# Patient Record
Sex: Male | Born: 1975
Health system: Southern US, Community
[De-identification: ages and names within clinical notes are randomized; demographics above are authoritative.]

## PROBLEM LIST (undated history)

## (undated) DIAGNOSIS — S86011A Strain of right Achilles tendon, initial encounter: Secondary | ICD-10-CM

## (undated) DIAGNOSIS — E669 Obesity, unspecified: Secondary | ICD-10-CM

## (undated) DIAGNOSIS — Z8719 Personal history of other diseases of the digestive system: Secondary | ICD-10-CM

## (undated) DIAGNOSIS — F1010 Alcohol abuse, uncomplicated: Secondary | ICD-10-CM

## (undated) DIAGNOSIS — Z8614 Personal history of Methicillin resistant Staphylococcus aureus infection: Secondary | ICD-10-CM

## (undated) DIAGNOSIS — K219 Gastro-esophageal reflux disease without esophagitis: Secondary | ICD-10-CM

## (undated) DIAGNOSIS — M25461 Effusion, right knee: Secondary | ICD-10-CM

## (undated) DIAGNOSIS — R40243 Glasgow coma scale score 3-8, unspecified time: Secondary | ICD-10-CM

## (undated) DIAGNOSIS — S82832A Other fracture of upper and lower end of left fibula, initial encounter for closed fracture: Secondary | ICD-10-CM

## (undated) DIAGNOSIS — D509 Iron deficiency anemia, unspecified: Secondary | ICD-10-CM

## (undated) DIAGNOSIS — D649 Anemia, unspecified: Secondary | ICD-10-CM

## (undated) DIAGNOSIS — K648 Other hemorrhoids: Secondary | ICD-10-CM

## (undated) DIAGNOSIS — L739 Follicular disorder, unspecified: Secondary | ICD-10-CM

## (undated) DIAGNOSIS — K649 Unspecified hemorrhoids: Secondary | ICD-10-CM

## (undated) DIAGNOSIS — R200 Anesthesia of skin: Secondary | ICD-10-CM

## (undated) DIAGNOSIS — K922 Gastrointestinal hemorrhage, unspecified: Secondary | ICD-10-CM

## (undated) DIAGNOSIS — I4891 Unspecified atrial fibrillation: Secondary | ICD-10-CM

## (undated) DIAGNOSIS — M1990 Unspecified osteoarthritis, unspecified site: Secondary | ICD-10-CM

## (undated) DIAGNOSIS — J9601 Acute respiratory failure with hypoxia: Secondary | ICD-10-CM

## (undated) DIAGNOSIS — E538 Deficiency of other specified B group vitamins: Secondary | ICD-10-CM

## (undated) DIAGNOSIS — Z8739 Personal history of other diseases of the musculoskeletal system and connective tissue: Secondary | ICD-10-CM

## (undated) DIAGNOSIS — I10 Essential (primary) hypertension: Secondary | ICD-10-CM

## (undated) DIAGNOSIS — K644 Residual hemorrhoidal skin tags: Secondary | ICD-10-CM

## (undated) DIAGNOSIS — R739 Hyperglycemia, unspecified: Secondary | ICD-10-CM

## (undated) HISTORY — DX: Follicular disorder, unspecified: L73.9

## (undated) HISTORY — PX: OTHER SURGICAL HISTORY: SHX169

## (undated) HISTORY — DX: Gastrointestinal hemorrhage, unspecified: K92.2

## (undated) HISTORY — PX: COLONOSCOPY W/ BIOPSIES AND POLYPECTOMY: SHX1376

## (undated) HISTORY — DX: Acute respiratory failure with hypoxia: J96.01

## (undated) HISTORY — DX: Residual hemorrhoidal skin tags: K64.4

## (undated) HISTORY — DX: Essential (primary) hypertension: I10

## (undated) HISTORY — DX: Personal history of other diseases of the musculoskeletal system and connective tissue: Z87.39

## (undated) HISTORY — DX: Glasgow coma scale score 3-8, unspecified time: R40.2430

## (undated) HISTORY — DX: Unspecified atrial fibrillation: I48.91

## (undated) HISTORY — PX: LEG SURGERY: SHX1003

## (undated) HISTORY — DX: Unspecified hemorrhoids: K64.9

## (undated) HISTORY — DX: Hyperglycemia, unspecified: R73.9

## (undated) HISTORY — DX: Strain of right Achilles tendon, initial encounter: S86.011A

---

## 2006-03-20 ENCOUNTER — Observation Stay (HOSPITAL_COMMUNITY): Admission: EM | Admit: 2006-03-20 | Discharge: 2006-03-21 | Payer: Self-pay | Admitting: *Deleted

## 2006-03-29 ENCOUNTER — Emergency Department (HOSPITAL_COMMUNITY): Admission: EM | Admit: 2006-03-29 | Discharge: 2006-03-30 | Payer: Self-pay | Admitting: *Deleted

## 2006-04-01 ENCOUNTER — Emergency Department (HOSPITAL_COMMUNITY): Admission: EM | Admit: 2006-04-01 | Discharge: 2006-04-01 | Payer: Self-pay | Admitting: Emergency Medicine

## 2006-04-04 ENCOUNTER — Inpatient Hospital Stay (HOSPITAL_COMMUNITY): Admission: EM | Admit: 2006-04-04 | Discharge: 2006-04-08 | Payer: Self-pay | Admitting: Emergency Medicine

## 2006-04-10 ENCOUNTER — Emergency Department (HOSPITAL_COMMUNITY): Admission: EM | Admit: 2006-04-10 | Discharge: 2006-04-10 | Payer: Self-pay | Admitting: Emergency Medicine

## 2006-04-14 ENCOUNTER — Emergency Department (HOSPITAL_COMMUNITY): Admission: EM | Admit: 2006-04-14 | Discharge: 2006-04-14 | Payer: Self-pay | Admitting: Emergency Medicine

## 2006-04-17 ENCOUNTER — Emergency Department (HOSPITAL_COMMUNITY): Admission: EM | Admit: 2006-04-17 | Discharge: 2006-04-17 | Payer: Self-pay | Admitting: Emergency Medicine

## 2006-04-20 ENCOUNTER — Inpatient Hospital Stay (HOSPITAL_COMMUNITY): Admission: EM | Admit: 2006-04-20 | Discharge: 2006-04-21 | Payer: Self-pay | Admitting: Emergency Medicine

## 2006-05-04 ENCOUNTER — Emergency Department (HOSPITAL_COMMUNITY): Admission: EM | Admit: 2006-05-04 | Discharge: 2006-05-04 | Payer: Self-pay | Admitting: Emergency Medicine

## 2006-05-06 ENCOUNTER — Ambulatory Visit: Payer: Self-pay | Admitting: Infectious Diseases

## 2006-05-13 ENCOUNTER — Encounter (HOSPITAL_BASED_OUTPATIENT_CLINIC_OR_DEPARTMENT_OTHER): Admission: RE | Admit: 2006-05-13 | Discharge: 2006-06-04 | Payer: Self-pay | Admitting: Surgery

## 2006-05-14 ENCOUNTER — Ambulatory Visit (HOSPITAL_COMMUNITY): Admission: RE | Admit: 2006-05-14 | Discharge: 2006-05-14 | Payer: Self-pay | Admitting: Surgery

## 2006-05-21 ENCOUNTER — Ambulatory Visit (HOSPITAL_COMMUNITY): Admission: RE | Admit: 2006-05-21 | Discharge: 2006-05-21 | Payer: Self-pay | Admitting: Surgery

## 2006-05-21 ENCOUNTER — Encounter: Payer: Self-pay | Admitting: Vascular Surgery

## 2006-05-25 ENCOUNTER — Ambulatory Visit: Payer: Self-pay | Admitting: Infectious Diseases

## 2006-06-08 ENCOUNTER — Emergency Department (HOSPITAL_COMMUNITY): Admission: EM | Admit: 2006-06-08 | Discharge: 2006-06-08 | Payer: Self-pay | Admitting: Emergency Medicine

## 2006-06-09 DIAGNOSIS — Z8614 Personal history of Methicillin resistant Staphylococcus aureus infection: Secondary | ICD-10-CM | POA: Insufficient documentation

## 2006-06-09 HISTORY — DX: Personal history of Methicillin resistant Staphylococcus aureus infection: Z86.14

## 2006-06-12 ENCOUNTER — Encounter (HOSPITAL_BASED_OUTPATIENT_CLINIC_OR_DEPARTMENT_OTHER): Admission: RE | Admit: 2006-06-12 | Discharge: 2006-07-01 | Payer: Self-pay | Admitting: Surgery

## 2006-07-02 ENCOUNTER — Encounter: Payer: Self-pay | Admitting: Infectious Diseases

## 2006-07-13 ENCOUNTER — Ambulatory Visit: Payer: Self-pay | Admitting: Infectious Diseases

## 2006-07-13 DIAGNOSIS — L0291 Cutaneous abscess, unspecified: Secondary | ICD-10-CM | POA: Insufficient documentation

## 2006-07-13 DIAGNOSIS — L0390 Cellulitis, unspecified: Secondary | ICD-10-CM

## 2006-08-10 ENCOUNTER — Encounter: Payer: Self-pay | Admitting: Infectious Diseases

## 2006-08-12 ENCOUNTER — Ambulatory Visit: Payer: Self-pay | Admitting: Infectious Diseases

## 2006-08-13 ENCOUNTER — Telehealth (INDEPENDENT_AMBULATORY_CARE_PROVIDER_SITE_OTHER): Payer: Self-pay | Admitting: *Deleted

## 2006-08-13 ENCOUNTER — Encounter: Payer: Self-pay | Admitting: Infectious Diseases

## 2006-08-14 ENCOUNTER — Encounter (HOSPITAL_BASED_OUTPATIENT_CLINIC_OR_DEPARTMENT_OTHER): Admission: RE | Admit: 2006-08-14 | Discharge: 2006-09-04 | Payer: Self-pay | Admitting: Surgery

## 2006-08-15 ENCOUNTER — Encounter: Payer: Self-pay | Admitting: Infectious Diseases

## 2006-08-15 ENCOUNTER — Ambulatory Visit (HOSPITAL_COMMUNITY): Admission: RE | Admit: 2006-08-15 | Discharge: 2006-08-15 | Payer: Self-pay | Admitting: Infectious Diseases

## 2006-08-17 ENCOUNTER — Encounter: Payer: Self-pay | Admitting: Infectious Diseases

## 2006-08-17 ENCOUNTER — Telehealth: Payer: Self-pay | Admitting: Infectious Diseases

## 2006-08-18 ENCOUNTER — Encounter: Payer: Self-pay | Admitting: Infectious Diseases

## 2006-09-16 ENCOUNTER — Encounter: Payer: Self-pay | Admitting: Infectious Diseases

## 2006-09-23 ENCOUNTER — Ambulatory Visit: Payer: Self-pay | Admitting: Infectious Diseases

## 2006-10-16 ENCOUNTER — Encounter: Payer: Self-pay | Admitting: Infectious Diseases

## 2007-06-22 ENCOUNTER — Emergency Department (HOSPITAL_COMMUNITY): Admission: EM | Admit: 2007-06-22 | Discharge: 2007-06-23 | Payer: Self-pay | Admitting: Emergency Medicine

## 2007-12-01 ENCOUNTER — Emergency Department (HOSPITAL_COMMUNITY): Admission: EM | Admit: 2007-12-01 | Discharge: 2007-12-01 | Payer: Self-pay | Admitting: Emergency Medicine

## 2008-08-08 ENCOUNTER — Emergency Department (HOSPITAL_COMMUNITY): Admission: EM | Admit: 2008-08-08 | Discharge: 2008-08-09 | Payer: Self-pay | Admitting: Emergency Medicine

## 2008-08-18 ENCOUNTER — Encounter (HOSPITAL_BASED_OUTPATIENT_CLINIC_OR_DEPARTMENT_OTHER): Admission: RE | Admit: 2008-08-18 | Discharge: 2008-09-11 | Payer: Self-pay | Admitting: Internal Medicine

## 2008-09-08 ENCOUNTER — Emergency Department (HOSPITAL_COMMUNITY): Admission: EM | Admit: 2008-09-08 | Discharge: 2008-09-08 | Payer: Self-pay | Admitting: *Deleted

## 2008-09-25 ENCOUNTER — Emergency Department (HOSPITAL_COMMUNITY): Admission: EM | Admit: 2008-09-25 | Discharge: 2008-09-26 | Payer: Self-pay | Admitting: Emergency Medicine

## 2008-09-29 ENCOUNTER — Encounter: Payer: Self-pay | Admitting: Infectious Diseases

## 2008-09-29 ENCOUNTER — Ambulatory Visit: Payer: Self-pay | Admitting: Internal Medicine

## 2008-09-29 DIAGNOSIS — R21 Rash and other nonspecific skin eruption: Secondary | ICD-10-CM | POA: Insufficient documentation

## 2008-10-31 ENCOUNTER — Ambulatory Visit: Payer: Self-pay | Admitting: Internal Medicine

## 2009-04-14 ENCOUNTER — Emergency Department (HOSPITAL_COMMUNITY): Admission: EM | Admit: 2009-04-14 | Discharge: 2009-04-14 | Payer: Self-pay | Admitting: Emergency Medicine

## 2009-11-23 ENCOUNTER — Ambulatory Visit: Payer: Self-pay | Admitting: Diagnostic Radiology

## 2009-11-23 ENCOUNTER — Emergency Department (HOSPITAL_BASED_OUTPATIENT_CLINIC_OR_DEPARTMENT_OTHER): Admission: EM | Admit: 2009-11-23 | Discharge: 2009-11-23 | Payer: Self-pay | Admitting: Emergency Medicine

## 2009-12-25 ENCOUNTER — Emergency Department (HOSPITAL_COMMUNITY): Admission: EM | Admit: 2009-12-25 | Discharge: 2009-12-25 | Payer: Self-pay | Admitting: Emergency Medicine

## 2010-01-01 ENCOUNTER — Emergency Department (HOSPITAL_BASED_OUTPATIENT_CLINIC_OR_DEPARTMENT_OTHER): Admission: EM | Admit: 2010-01-01 | Discharge: 2010-01-01 | Payer: Self-pay | Admitting: Emergency Medicine

## 2010-01-10 ENCOUNTER — Encounter (HOSPITAL_BASED_OUTPATIENT_CLINIC_OR_DEPARTMENT_OTHER): Admission: RE | Admit: 2010-01-10 | Discharge: 2010-02-07 | Payer: Self-pay | Admitting: General Surgery

## 2010-03-23 ENCOUNTER — Emergency Department (HOSPITAL_BASED_OUTPATIENT_CLINIC_OR_DEPARTMENT_OTHER): Admission: EM | Admit: 2010-03-23 | Discharge: 2010-03-23 | Payer: Self-pay | Admitting: Emergency Medicine

## 2010-04-22 ENCOUNTER — Inpatient Hospital Stay (HOSPITAL_COMMUNITY): Admission: EM | Admit: 2010-04-22 | Discharge: 2010-04-26 | Payer: Self-pay | Admitting: Emergency Medicine

## 2010-04-22 ENCOUNTER — Emergency Department (HOSPITAL_COMMUNITY): Admission: EM | Admit: 2010-04-22 | Discharge: 2010-04-22 | Payer: Self-pay | Admitting: Emergency Medicine

## 2010-04-24 ENCOUNTER — Encounter: Payer: Self-pay | Admitting: Internal Medicine

## 2010-06-30 ENCOUNTER — Encounter: Payer: Self-pay | Admitting: Infectious Diseases
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
## 2010-07-09 NOTE — Letter (Signed)
Summary: [facility]   Imported By: Tomasita Morrow RN 08/19/2006 12:06:50  _____________________________________________________________________  External Attachment:    Type:   Image     Comment:   External Document
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
## 2010-07-09 NOTE — Progress Notes (Signed)
Summary: phne note re:MRI results  Phone Note Call from Patient Call back at Home Phone [phone]   Caller: Patient Call For: md Reason for Call: Lab or Test Results Summary of Call: Would like results of MRI.  Told patient that I would check on the results and get back with him after speaking withDr. Initial call taken by: Jennet Maduro RN,  August 17, 2006 10:49 AM

## 2010-07-09 NOTE — Procedures (Signed)
Summary: colonscopy /J. Mann   Colonoscopy  Procedure date:  04/24/2010  Findings:      Location:  [facility], [place].    OPERATIVE PROCEDURE REPORT  PATIENT: Wayne [name], Wayne [name] MR#: [id number] BIRTHDATE:  [birth year]  GENDER:  male ENDOSCOPIST:  Lorenza Burton, MD ASSISTANT:  Jimmey Ralph, RN, CGRN and Moishe Spice, technician.  PROCEDURE DATE:  04/24/2010 PRE-PROCEDURE PREPERATION:  The patient was prepped with 2 dulcolax tablets, one ten-ounce bottle of magnesium citrate, and a gallon of Golytley the night prior to the procedure.  The patient was fasted for 8 hours prior to the procedure. PRE-PROCEDURE PHYSICAL:  Patient has stable vital signs. Neck is supple. There is no JVD, thyromegaly or LAD. Chest clear to auscultation. S1 and S2 regular. Abdomen soft, non-distended, non-tender with NABS. PROCEDURE: Colonoscopy with cold biopsies x 4. ASA CLASS:  Class II INDICATIONS: 1) Rectal bleeding 2) Iron deficiency anemia 3) Constipation.  MEDICATIONS:  Fentanyl 25 mcg & Versed 2 mg IV.  DESCRIPTION OF PROCEDURE: After the risks, benefits, and alternatives of the procedure were thoroughly explained [including a 10% missed rate of cancer and polyps], informed consent was obtained.  The Pentax video colonoscope [id number] was introduced through the anus and advanced to the cecum, which was identified by both the appendix and ileocecal valve, limited by a redundant colon. The quality of the prep was adequate. Multiple washes were done. Small lesions could be missed. The instrument was then slowly withdrawn as the colon was fully examined. <<PROCEDUREIMAGES>>      <<OLD IMAGES>>  FINDINGS:  There were 2 small sessile distal left colon polyps that were removed by cold biopsies x 4. The rest of the colonic mucosa appeared healthy with a normal vascular pattern.  No masses, diverticula or AVM's were noted.  The appendiceal orifice and the ICV were identified. Retroflexed views revealed prominent  internal hemorrhoids. A thrombosed hemorrhoid was noted. The patient tolerated the procedure without immediate complications.  The scope was then withdrawn from the patient and the procedure terminated.  IMPRESSION: Large bleeding internal hemorrhoids and 2 sessile distal left colon polyps removed by cold biopsies; otherwise normal colonoscopy up to the cecum; there was a lot of residual stool in the colon [small lesions could be missed]. RECOMMENDATIONS: 1) Await pathology results.  2) Avoid all NSAIDS for the next 2 weeks.  3) High fiber diet with liberal fluid intake. 4) Colace 100 mg 2 PO QHS.  5) Surgical evaluation for a hemorrhoidectomy.  REPEAT EXAM:   At age 89; in case the patient has any abnormal GI symptoms in the interim, he/she should contact the office immediately for further recommendations.  DISCHARGE INSTRUCTIONS:  Standard discharge instructions given.  _______________________________ Lorenza Burton, MD  CPT CODES: 96295   DIAGNOSIS CODES:  211.3, 569.3, 280.9, 564.00

## 2010-07-09 NOTE — Progress Notes (Signed)
  Phone Note Outgoing Call   Call placed by: Jennet Maduro RN,  August 13, 2006 2:25 PM Call placed to: Specialist Action Taken: Information Sent Request: Send information Reason for Call: Get patient information Summary of Call: Faxed 08/12/06 [facility] to [facility] to allow pt. to make follow-up visit. Initial call taken by: Tomasita Morrow, RN

## 2010-08-20 LAB — DIFFERENTIAL
Basophils Absolute: 0 10*3/uL (ref 0.0–0.1)
Basophils Relative: 0 % (ref 0–1)
Eosinophils Absolute: 0.1 10*3/uL (ref 0.0–0.7)
Eosinophils Relative: 3 % (ref 0–5)
Lymphocytes Relative: 25 % (ref 12–46)
Lymphs Abs: 1.2 10*3/uL (ref 0.7–4.0)
Monocytes Absolute: 0.6 10*3/uL (ref 0.1–1.0)
Monocytes Relative: 12 % (ref 3–12)
Neutro Abs: 2.9 10*3/uL (ref 1.7–7.7)
Neutrophils Relative %: 60 % (ref 43–77)

## 2010-08-20 LAB — CBC
HCT: 25.1 % — ABNORMAL LOW (ref 39.0–52.0)
HCT: 25.9 % — ABNORMAL LOW (ref 39.0–52.0)
HCT: 28.3 % — ABNORMAL LOW (ref 39.0–52.0)
HCT: 28.7 % — ABNORMAL LOW (ref 39.0–52.0)
HCT: 28.8 % — ABNORMAL LOW (ref 39.0–52.0)
HCT: 29 % — ABNORMAL LOW (ref 39.0–52.0)
HCT: 29.5 % — ABNORMAL LOW (ref 39.0–52.0)
HCT: 30.4 % — ABNORMAL LOW (ref 39.0–52.0)
HCT: 32.8 % — ABNORMAL LOW (ref 39.0–52.0)
Hemoglobin: 7.3 g/dL — ABNORMAL LOW (ref 13.0–17.0)
Hemoglobin: 7.6 g/dL — ABNORMAL LOW (ref 13.0–17.0)
Hemoglobin: 8.5 g/dL — ABNORMAL LOW (ref 13.0–17.0)
Hemoglobin: 8.7 g/dL — ABNORMAL LOW (ref 13.0–17.0)
Hemoglobin: 8.7 g/dL — ABNORMAL LOW (ref 13.0–17.0)
Hemoglobin: 8.8 g/dL — ABNORMAL LOW (ref 13.0–17.0)
Hemoglobin: 8.9 g/dL — ABNORMAL LOW (ref 13.0–17.0)
Hemoglobin: 9.2 g/dL — ABNORMAL LOW (ref 13.0–17.0)
Hemoglobin: 9.7 g/dL — ABNORMAL LOW (ref 13.0–17.0)
MCH: 18.4 pg — ABNORMAL LOW (ref 26.0–34.0)
MCH: 18.6 pg — ABNORMAL LOW (ref 26.0–34.0)
MCH: 20 pg — ABNORMAL LOW (ref 26.0–34.0)
MCH: 20 pg — ABNORMAL LOW (ref 26.0–34.0)
MCH: 20.1 pg — ABNORMAL LOW (ref 26.0–34.0)
MCH: 20.2 pg — ABNORMAL LOW (ref 26.0–34.0)
MCH: 20.3 pg — ABNORMAL LOW (ref 26.0–34.0)
MCH: 20.4 pg — ABNORMAL LOW (ref 26.0–34.0)
MCH: 20.5 pg — ABNORMAL LOW (ref 26.0–34.0)
MCHC: 29.2 g/dL — ABNORMAL LOW (ref 30.0–36.0)
MCHC: 29.4 g/dL — ABNORMAL LOW (ref 30.0–36.0)
MCHC: 29.6 g/dL — ABNORMAL LOW (ref 30.0–36.0)
MCHC: 29.9 g/dL — ABNORMAL LOW (ref 30.0–36.0)
MCHC: 30 g/dL (ref 30.0–36.0)
MCHC: 30.1 g/dL (ref 30.0–36.0)
MCHC: 30.3 g/dL (ref 30.0–36.0)
MCHC: 30.3 g/dL (ref 30.0–36.0)
MCHC: 30.5 g/dL (ref 30.0–36.0)
MCV: 63 fL — ABNORMAL LOW (ref 78.0–100.0)
MCV: 63.2 fL — ABNORMAL LOW (ref 78.0–100.0)
MCV: 66.3 fL — ABNORMAL LOW (ref 78.0–100.0)
MCV: 66.6 fL — ABNORMAL LOW (ref 78.0–100.0)
MCV: 67.1 fL — ABNORMAL LOW (ref 78.0–100.0)
MCV: 67.2 fL — ABNORMAL LOW (ref 78.0–100.0)
MCV: 67.3 fL — ABNORMAL LOW (ref 78.0–100.0)
MCV: 67.7 fL — ABNORMAL LOW (ref 78.0–100.0)
MCV: 68.4 fL — ABNORMAL LOW (ref 78.0–100.0)
Platelets: 193 10*3/uL (ref 150–400)
Platelets: 199 10*3/uL (ref 150–400)
Platelets: 202 10*3/uL (ref 150–400)
Platelets: 203 10*3/uL (ref 150–400)
Platelets: 211 10*3/uL (ref 150–400)
Platelets: 217 10*3/uL (ref 150–400)
Platelets: 217 10*3/uL (ref 150–400)
Platelets: 218 10*3/uL (ref 150–400)
Platelets: 259 10*3/uL (ref 150–400)
RBC: 3.99 MIL/uL — ABNORMAL LOW (ref 4.22–5.81)
RBC: 4.1 MIL/uL — ABNORMAL LOW (ref 4.22–5.81)
RBC: 4.2 MIL/uL — ABNORMAL LOW (ref 4.22–5.81)
RBC: 4.31 MIL/uL (ref 4.22–5.81)
RBC: 4.32 MIL/uL (ref 4.22–5.81)
RBC: 4.32 MIL/uL (ref 4.22–5.81)
RBC: 4.35 MIL/uL (ref 4.22–5.81)
RBC: 4.52 MIL/uL (ref 4.22–5.81)
RBC: 4.84 MIL/uL (ref 4.22–5.81)
RDW: 18.8 % — ABNORMAL HIGH (ref 11.5–15.5)
RDW: 18.9 % — ABNORMAL HIGH (ref 11.5–15.5)
RDW: 21.5 % — ABNORMAL HIGH (ref 11.5–15.5)
RDW: 22.3 % — ABNORMAL HIGH (ref 11.5–15.5)
RDW: 22.3 % — ABNORMAL HIGH (ref 11.5–15.5)
RDW: 22.5 % — ABNORMAL HIGH (ref 11.5–15.5)
RDW: 22.5 % — ABNORMAL HIGH (ref 11.5–15.5)
RDW: 23.2 % — ABNORMAL HIGH (ref 11.5–15.5)
RDW: 23.6 % — ABNORMAL HIGH (ref 11.5–15.5)
WBC: 4.8 10*3/uL (ref 4.0–10.5)
WBC: 5.3 10*3/uL (ref 4.0–10.5)
WBC: 5.6 10*3/uL (ref 4.0–10.5)
WBC: 5.7 10*3/uL (ref 4.0–10.5)
WBC: 5.8 10*3/uL (ref 4.0–10.5)
WBC: 5.9 10*3/uL (ref 4.0–10.5)
WBC: 6 10*3/uL (ref 4.0–10.5)
WBC: 6 10*3/uL (ref 4.0–10.5)
WBC: 6.1 10*3/uL (ref 4.0–10.5)

## 2010-08-20 LAB — CROSSMATCH
ABO/RH(D): O POS
Antibody Screen: NEGATIVE
Unit division: 0
Unit division: 0

## 2010-08-20 LAB — BASIC METABOLIC PANEL
BUN: 10 mg/dL (ref 6–23)
BUN: 6 mg/dL (ref 6–23)
BUN: 7 mg/dL (ref 6–23)
BUN: 7 mg/dL (ref 6–23)
CO2: 22 mEq/L (ref 19–32)
CO2: 26 mEq/L (ref 19–32)
CO2: 26 mEq/L (ref 19–32)
CO2: 27 mEq/L (ref 19–32)
Calcium: 9 mg/dL (ref 8.4–10.5)
Calcium: 9.1 mg/dL (ref 8.4–10.5)
Calcium: 9.2 mg/dL (ref 8.4–10.5)
Calcium: 9.4 mg/dL (ref 8.4–10.5)
Chloride: 103 mEq/L (ref 96–112)
Chloride: 103 mEq/L (ref 96–112)
Chloride: 104 mEq/L (ref 96–112)
Chloride: 106 mEq/L (ref 96–112)
Creatinine, Ser: 1.05 mg/dL (ref 0.4–1.5)
Creatinine, Ser: 1.06 mg/dL (ref 0.4–1.5)
Creatinine, Ser: 1.09 mg/dL (ref 0.4–1.5)
Creatinine, Ser: 1.24 mg/dL (ref 0.4–1.5)
GFR calc Af Amer: >60 mL/min (ref >60)
GFR calc Af Amer: >60 mL/min (ref >60)
GFR calc Af Amer: >60 mL/min (ref >60)
GFR calc Af Amer: >60 mL/min (ref >60)
GFR calc non Af Amer: >60 mL/min (ref >60)
GFR calc non Af Amer: >60 mL/min (ref >60)
GFR calc non Af Amer: >60 mL/min (ref >60)
GFR calc non Af Amer: >60 mL/min (ref >60)
Glucose, Bld: 105 mg/dL — ABNORMAL HIGH (ref 70–99)
Glucose, Bld: 121 mg/dL — ABNORMAL HIGH (ref 70–99)
Glucose, Bld: 93 mg/dL (ref 70–99)
Glucose, Bld: 97 mg/dL (ref 70–99)
Potassium: 3.5 mEq/L (ref 3.5–5.1)
Potassium: 3.7 mEq/L (ref 3.5–5.1)
Potassium: 4.1 mEq/L (ref 3.5–5.1)
Potassium: 4.2 mEq/L (ref 3.5–5.1)
Sodium: 135 mEq/L (ref 135–145)
Sodium: 138 mEq/L (ref 135–145)
Sodium: 139 mEq/L (ref 135–145)
Sodium: 140 mEq/L (ref 135–145)

## 2010-08-20 LAB — RETICULOCYTES
RBC.: 4.09 MIL/uL — ABNORMAL LOW (ref 4.22–5.81)
Retic Count, Absolute: 77.7 10*3/uL (ref 19.0–186.0)
Retic Ct Pct: 1.9 % (ref 0.4–3.1)

## 2010-08-20 LAB — MAGNESIUM: Magnesium: 2.1 mg/dL (ref 1.5–2.5)

## 2010-08-20 LAB — HEPATIC FUNCTION PANEL
ALT: 20 U/L (ref 0–53)
AST: 24 U/L (ref 0–37)
Albumin: 3.6 g/dL (ref 3.5–5.2)
Alkaline Phosphatase: 45 U/L (ref 39–117)
Bilirubin, Direct: <0.1 mg/dL (ref 0.0–0.3)
Total Bilirubin: 0.6 mg/dL (ref 0.3–1.2)
Total Protein: 6.9 g/dL (ref 6.0–8.3)

## 2010-08-20 LAB — IRON AND TIBC
Iron: 12 ug/dL — ABNORMAL LOW (ref 42–135)
Iron: 15 ug/dL — ABNORMAL LOW (ref 42–135)
Saturation Ratios: 4 % — ABNORMAL LOW (ref 20–55)
TIBC: 296 ug/dL (ref 215–435)
UIBC: 284 ug/dL
UIBC: >450 ug/dL

## 2010-08-20 LAB — TSH: TSH: 1.156 u[IU]/mL (ref 0.350–4.500)

## 2010-08-20 LAB — APTT: aPTT: 29 seconds (ref 24–37)

## 2010-08-20 LAB — VITAMIN B12: Vitamin B-12: 216 pg/mL (ref 211–911)

## 2010-08-20 LAB — FOLATE: Folate: 13.2 ng/mL

## 2010-08-20 LAB — PROTIME-INR
INR: 1.08 (ref 0.00–1.49)
Prothrombin Time: 14.2 seconds (ref 11.6–15.2)

## 2010-08-20 LAB — HEMOCCULT GUIAC POC 1CARD (OFFICE): Fecal Occult Bld: POSITIVE

## 2010-08-20 LAB — URIC ACID: Uric Acid, Serum: 8.9 mg/dL — ABNORMAL HIGH (ref 4.0–7.8)

## 2010-08-20 LAB — FERRITIN
Ferritin: 2 ng/mL — ABNORMAL LOW (ref 22–322)
Ferritin: 4 ng/mL — ABNORMAL LOW (ref 22–322)

## 2010-08-20 LAB — SEDIMENTATION RATE: Sed Rate: 8 mm/hr (ref 0–16)

## 2010-08-20 LAB — MRSA PCR SCREENING: MRSA by PCR: NEGATIVE

## 2010-08-24 LAB — DIFFERENTIAL
Basophils Absolute: 0 10*3/uL (ref 0.0–0.1)
Basophils Relative: 0 % (ref 0–1)
Eosinophils Absolute: 0.3 10*3/uL (ref 0.0–0.7)
Eosinophils Relative: 6 % — ABNORMAL HIGH (ref 0–5)
Lymphocytes Relative: 21 % (ref 12–46)
Lymphs Abs: 1.1 10*3/uL (ref 0.7–4.0)
Monocytes Absolute: 0.5 10*3/uL (ref 0.1–1.0)
Monocytes Relative: 11 % (ref 3–12)
Neutro Abs: 3 10*3/uL (ref 1.7–7.7)
Neutrophils Relative %: 62 % (ref 43–77)

## 2010-08-24 LAB — CBC
HCT: 32 % — ABNORMAL LOW (ref 39.0–52.0)
Hemoglobin: 10 g/dL — ABNORMAL LOW (ref 13.0–17.0)
MCH: 23.3 pg — ABNORMAL LOW (ref 26.0–34.0)
MCHC: 31.3 g/dL (ref 30.0–36.0)
MCV: 74.6 fL — ABNORMAL LOW (ref 78.0–100.0)
Platelets: 227 10*3/uL (ref 150–400)
RBC: 4.29 MIL/uL (ref 4.22–5.81)
RDW: 16.5 % — ABNORMAL HIGH (ref 11.5–15.5)
WBC: 4.9 10*3/uL (ref 4.0–10.5)

## 2010-08-24 LAB — BASIC METABOLIC PANEL
BUN: 10 mg/dL (ref 6–23)
CO2: 25 mEq/L (ref 19–32)
Calcium: 8.9 mg/dL (ref 8.4–10.5)
Chloride: 109 mEq/L (ref 96–112)
Creatinine, Ser: 1.21 mg/dL (ref 0.4–1.5)
GFR calc Af Amer: >60 mL/min (ref >60)
GFR calc non Af Amer: >60 mL/min (ref >60)
Glucose, Bld: 107 mg/dL — ABNORMAL HIGH (ref 70–99)
Potassium: 3.6 mEq/L (ref 3.5–5.1)
Sodium: 140 mEq/L (ref 135–145)

## 2010-09-11 LAB — CBC
HCT: 37.7 % — ABNORMAL LOW (ref 39.0–52.0)
Hemoglobin: 12.1 g/dL — ABNORMAL LOW (ref 13.0–17.0)
MCHC: 32.2 g/dL (ref 30.0–36.0)
MCV: 79.2 fL (ref 78.0–100.0)
Platelets: 188 10*3/uL (ref 150–400)
RBC: 4.76 MIL/uL (ref 4.22–5.81)
RDW: 15.8 % — ABNORMAL HIGH (ref 11.5–15.5)
WBC: 3.4 10*3/uL — ABNORMAL LOW (ref 4.0–10.5)

## 2010-09-11 LAB — DIFFERENTIAL
Basophils Absolute: 0.1 10*3/uL (ref 0.0–0.1)
Basophils Relative: 4 % — ABNORMAL HIGH (ref 0–1)
Eosinophils Absolute: 0.2 10*3/uL (ref 0.0–0.7)
Eosinophils Relative: 7 % — ABNORMAL HIGH (ref 0–5)
Lymphocytes Relative: 30 % (ref 12–46)
Lymphs Abs: 1 10*3/uL (ref 0.7–4.0)
Monocytes Absolute: 0.5 10*3/uL (ref 0.1–1.0)
Monocytes Relative: 14 % — ABNORMAL HIGH (ref 3–12)
Neutro Abs: 1.6 10*3/uL — ABNORMAL LOW (ref 1.7–7.7)
Neutrophils Relative %: 45 % (ref 43–77)
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
## 2010-10-25 NOTE — Discharge Summary (Signed)
NAMESHUAYB, Wayne [name]               ACCOUNT NO.:  [id number]   MEDICAL RECORD NO.:  [id number]          PATIENT TYPE:  INP   LOCATION:  1508                         FACILITY:  [facility]   PHYSICIAN:  Michaelyn Barter, M.D. DATE OF BIRTH:  1975-06-26   DATE OF ADMISSION:  04/19/2006  DATE OF DISCHARGE:  04/21/2006                               DISCHARGE SUMMARY   PRIMARY CARE PHYSICIAN:  Unassigned.   FINAL DIAGNOSIS:  Right lower extremity cellulitis.   PROCEDURES:  Chest x-ray completed on April 19, 2006.   HISTORY OF PRESENT ILLNESS:  Wayne [name] is a 35 year old male who came in  with the chief complaint of pain and swelling in his right lower  extremity.  He stated that he had been treated only a few days ago for  cellulitis of his right leg secondary to methicillin resistant  Staphylococcus aureus.  He was discharged home and received 10 days of  IV vancomycin.  Over the few days leading up to his admission the  patient indicated that he began to develop pain and swelling in his  right leg and therefore came back to the hospital for further  evaluation.   PAST MEDICAL HISTORY:  For past medical history please see that dictated  by Dr. Hadley Pen.   HOSPITAL COURSE:  PROBLEM #1:  CELLULITIS OF THE RIGHT LOWER EXTREMITY:  The patient was started on IV vancomycin.  Blood cultures were drawn on  April 19, 2006 and were negative x2.  The patient did spike fever's,  however, throughout the course of his hospitalization.  In particular,  when the patient first arrived into the hospital, he spiked low grade  fevers off and on.  With IV antibiotics, the patient indicated that he  has felt significantly better.  He initially did complain of some pain  during the earlier course of his hospitalization but that appeared to  have alleviated with Tylenol.  Wound Care was consulted and they saw the  patient on April 20, 2006.  They indicated that the patient had  indicated that he had  generalized redness and edema within his legs but  presently it appeared to be resolving.  He currently has dry, peeling  skin without drainage or open wound, therefore, there is no need at this  particular time for topical wound care.  They encouraged the patient to  remove the dry, peeling skin with bathing or showering and no further  recommendations were made.  The patient's condition today, at the time  of discharge, currently is with no complaints.  His vital signs show  temperature currently at 97.5, heart rate 80, respirations 20, blood  pressure 106/72, oxygen saturation is 94% on room air.  The decision has  been made to discharge the patient from the hospital.  The patient will  be discharged from the hospital on vancomycin 1.5 grams IV q.12h. for an  additional 14 days.  Home health has been consulted and they will  arrange for a visiting nurse to follow the patient and also for a  pharmacist to dose the vancomycin.  Michaelyn Barter, M.D.  Electronically Signed     OR/MEDQ  D:  04/21/2006  T:  04/21/2006  Job:  [id number]

## 2010-10-25 NOTE — Consult Note (Signed)
[name], Wayne [name]               ACCOUNT NO.:  [id number]   MEDICAL RECORD NO.:  [id number]          PATIENT TYPE:  REC   LOCATION:  FOOT                         FACILITY:  [facility]   PHYSICIAN:  Lebron Conners, M.D.   DATE OF BIRTH:  11/22/1975   DATE OF CONSULTATION:  06/24/2006  DATE OF DISCHARGE:                                 CONSULTATION   [facility].   CHIEF COMPLAINT:  Sore on the right leg.   HISTORY:  This man had a severe infection in his right leg with MRSA and  had extensive skin breakdown.  There was grafting which broke down.  It  looks good today.  He has been getting Doctor, general practice; then  about a month ago; that was stopped; and he has been on stockings.  He  has not been having a lot of trouble with swelling.  He had a culture of  some minor drainage a week ago which grew out coag-negative Staph.  Unna  boot was reapplied.  He is back in followup.  At this time there is  absolutely complete healing of the skin with no areas of thin skin; it  really looks excellent.   IMPRESSION:  Complete healing.   PLAN:  Daily bathing and good skin care with moisturizing lotion.  I  think he should continue to wear a support stocking.  Return here if  there is any skin breakdown.      Lebron Conners, M.D.  Electronically Signed     WB/MEDQ  D:  06/24/2006  T:  06/24/2006  Job:  [id number]

## 2010-10-25 NOTE — Assessment & Plan Note (Signed)
[facility]   NAME:  Wayne [name], Wayne [name]               ACCOUNT NO.:  [id number]   MEDICAL RECORD NO.:  [id number]           DATE OF BIRTH:   PHYSICIAN:  Maxwell Caul, M.D. VISIT DATE:  05/22/2006                                   OFFICE VISIT   VITAL SIGNS:   PURPOSE OF TODAY'S VISIT:  Wayne [name] returns in followup from his initial  visit last week.   WOUND SINCE LAST VISIT:  He in the interim has worn an Astronomer.  He reports no evidence of a fever, pain, or drainage.  After removal of  the Unna, he feels the area is considerably better.  There has been good  healing of the scaling that became secondarily infected, and he was  treated with intravenous antibiotics.  Currently, he is still getting  clindamycin and will follow up with infectious disease on Monday.   His laboratory work has come back.  He has normal ABIs, and Doppler  waveforms were normal bilaterally.  His Duplex ultrasound was negative  for DVT, superficial thrombosis, or Baker's cyst.  His x-ray of the left  showed surgical clips in the right lateral calf soft tissues.  There was  soft tissue swelling but no evidence of osteomyelitis.  There was no  soft tissue gas.   WOUND EXAM:  On examination, there is deformity of the right leg  laterally as noted before.  He has changes consistent with chronic  venous hypertension and stasis in the lateral right leg.  By the  patient's description, this became much worse when he had this secondary  MRSA infection.  However, there is no current evidence of infection.  There is still tiny open areas that have not completely closed over.  However, any evidence of infection seems to be resolved to me.  His  dorsalis pedis and posterior tibial pulses in the right foot are much  improved.   MANAGEMENT PLAN & GOAL:  We have gone ahead and applied TCA to this area  and will rewrap him.  I think in one week's time, he can get his graded  pressure  stockings which I think are going to be a chronic thing for  this gentleman.  I suspect he had chronic venous hypertension with  progressive changes that became secondarily infected.  As long as we  control the edema in this area, I think we should keep him away from  ulceration and the risk of infection.  I discussed this with him in  considerable detail.           ______________________________  Maxwell Caul, M.D.     MGR/MEDQ  D:  05/22/2006  T:  05/23/2006  Job:  [id number]
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
## 2011-02-16 ENCOUNTER — Emergency Department (HOSPITAL_COMMUNITY)
Admission: EM | Admit: 2011-02-16 | Discharge: 2011-02-16 | Disposition: A | Discharge status: Home / Self Care | Payer: Self-pay | Attending: Emergency Medicine | Admitting: Emergency Medicine

## 2011-02-16 DIAGNOSIS — M25569 Pain in unspecified knee: Secondary | ICD-10-CM | POA: Insufficient documentation

## 2011-02-16 DIAGNOSIS — M109 Gout, unspecified: Secondary | ICD-10-CM | POA: Insufficient documentation

## 2011-02-16 DIAGNOSIS — R42 Dizziness and giddiness: Secondary | ICD-10-CM | POA: Insufficient documentation

## 2011-02-16 DIAGNOSIS — R609 Edema, unspecified: Secondary | ICD-10-CM | POA: Insufficient documentation

## 2011-02-16 LAB — CBC
HCT: 36.9 % — ABNORMAL LOW (ref 39.0–52.0)
Hemoglobin: 11 g/dL — ABNORMAL LOW (ref 13.0–17.0)
MCH: 23.8 pg — ABNORMAL LOW (ref 26.0–34.0)
MCHC: 29.8 g/dL — ABNORMAL LOW (ref 30.0–36.0)
MCV: 79.9 fL (ref 78.0–100.0)
Platelets: 263 10*3/uL (ref 150–400)
RBC: 4.62 MIL/uL (ref 4.22–5.81)
RDW: 18.2 % — ABNORMAL HIGH (ref 11.5–15.5)
WBC: 5.3 10*3/uL (ref 4.0–10.5)

## 2011-02-16 LAB — DIFFERENTIAL
Basophils Absolute: 0 10*3/uL (ref 0.0–0.1)
Basophils Relative: 0 % (ref 0–1)
Eosinophils Absolute: 0.2 10*3/uL (ref 0.0–0.7)
Eosinophils Relative: 3 % (ref 0–5)
Lymphocytes Relative: 25 % (ref 12–46)
Lymphs Abs: 1.3 10*3/uL (ref 0.7–4.0)
Monocytes Absolute: 0.6 10*3/uL (ref 0.1–1.0)
Monocytes Relative: 11 % (ref 3–12)
Neutro Abs: 3.2 10*3/uL (ref 1.7–7.7)
Neutrophils Relative %: 61 % (ref 43–77)

## 2011-02-17 LAB — POCT I-STAT, CHEM 8
BUN: 11 mg/dL (ref 6–23)
Calcium, Ion: 1.17 mmol/L (ref 1.12–1.32)
Chloride: 105 mEq/L (ref 96–112)
Creatinine, Ser: 1.2 mg/dL (ref 0.50–1.35)
Glucose, Bld: 94 mg/dL (ref 70–99)
HCT: 40 % (ref 39.0–52.0)
Hemoglobin: 13.6 g/dL (ref 13.0–17.0)
Potassium: 3.7 mEq/L (ref 3.5–5.1)
Sodium: 139 mEq/L (ref 135–145)
TCO2: 22 mmol/L (ref 0–100)

## 2011-02-27 LAB — DIFFERENTIAL
Basophils Absolute: 0
Basophils Relative: 1
Eosinophils Absolute: 0.1
Eosinophils Relative: 3
Lymphocytes Relative: 19
Lymphs Abs: 0.8
Monocytes Absolute: 0.3
Monocytes Relative: 7
Neutro Abs: 3
Neutrophils Relative %: 70

## 2011-02-27 LAB — CBC
HCT: 33.1 — ABNORMAL LOW
Hemoglobin: 10.4 — ABNORMAL LOW
MCHC: 31.4
MCV: 82.4
Platelets: 236
RBC: 4.02 — ABNORMAL LOW
RDW: 15.5
WBC: 4.3

## 2011-03-06 LAB — CBC
HCT: 44.3
Hemoglobin: 14.8
MCHC: 33.3
MCV: 93
Platelets: 179
RBC: 4.77
RDW: 13.2
WBC: 3.4 — ABNORMAL LOW

## 2011-04-17 ENCOUNTER — Encounter: Payer: Self-pay | Admitting: Emergency Medicine

## 2011-04-17 ENCOUNTER — Inpatient Hospital Stay (HOSPITAL_BASED_OUTPATIENT_CLINIC_OR_DEPARTMENT_OTHER)
Admission: EM | Admit: 2011-04-17 | Discharge: 2011-04-19 | DRG: 565 | Disposition: A | Discharge status: Home / Self Care | Payer: Self-pay | Attending: Internal Medicine | Admitting: Internal Medicine

## 2011-04-17 DIAGNOSIS — M109 Gout, unspecified: Secondary | ICD-10-CM | POA: Diagnosis present

## 2011-04-17 DIAGNOSIS — E669 Obesity, unspecified: Secondary | ICD-10-CM | POA: Diagnosis present

## 2011-04-17 DIAGNOSIS — L299 Pruritus, unspecified: Secondary | ICD-10-CM

## 2011-04-17 DIAGNOSIS — D509 Iron deficiency anemia, unspecified: Secondary | ICD-10-CM

## 2011-04-17 DIAGNOSIS — K922 Gastrointestinal hemorrhage, unspecified: Secondary | ICD-10-CM

## 2011-04-17 DIAGNOSIS — A4902 Methicillin resistant Staphylococcus aureus infection, unspecified site: Secondary | ICD-10-CM | POA: Insufficient documentation

## 2011-04-17 DIAGNOSIS — M25469 Effusion, unspecified knee: Principal | ICD-10-CM | POA: Diagnosis present

## 2011-04-17 DIAGNOSIS — E538 Deficiency of other specified B group vitamins: Secondary | ICD-10-CM | POA: Diagnosis present

## 2011-04-17 DIAGNOSIS — K648 Other hemorrhoids: Secondary | ICD-10-CM | POA: Diagnosis present

## 2011-04-17 DIAGNOSIS — F1010 Alcohol abuse, uncomplicated: Secondary | ICD-10-CM | POA: Diagnosis present

## 2011-04-17 DIAGNOSIS — Z6841 Body Mass Index (BMI) 40.0 and over, adult: Secondary | ICD-10-CM

## 2011-04-17 DIAGNOSIS — M25461 Effusion, right knee: Secondary | ICD-10-CM | POA: Diagnosis present

## 2011-04-17 DIAGNOSIS — K5900 Constipation, unspecified: Secondary | ICD-10-CM | POA: Diagnosis present

## 2011-04-17 DIAGNOSIS — D500 Iron deficiency anemia secondary to blood loss (chronic): Secondary | ICD-10-CM | POA: Diagnosis present

## 2011-04-17 HISTORY — DX: Other hemorrhoids: K64.8

## 2011-04-17 HISTORY — DX: Deficiency of other specified B group vitamins: E53.8

## 2011-04-17 HISTORY — DX: Gastrointestinal hemorrhage, unspecified: K92.2

## 2011-04-17 HISTORY — DX: Anemia, unspecified: D64.9

## 2011-04-17 HISTORY — DX: Alcohol abuse, uncomplicated: F10.10

## 2011-04-17 HISTORY — DX: Obesity, unspecified: E66.9

## 2011-04-17 HISTORY — DX: Effusion, right knee: M25.461

## 2011-04-17 HISTORY — DX: Iron deficiency anemia, unspecified: D50.9

## 2011-04-17 LAB — OCCULT BLOOD X 1 CARD TO LAB, STOOL: Fecal Occult Bld: POSITIVE

## 2011-04-17 LAB — CBC
HCT: 30.4 % — ABNORMAL LOW (ref 39.0–52.0)
Hemoglobin: 8.7 g/dL — ABNORMAL LOW (ref 13.0–17.0)
MCH: 21.1 pg — ABNORMAL LOW (ref 26.0–34.0)
MCHC: 28.6 g/dL — ABNORMAL LOW (ref 30.0–36.0)
MCV: 73.6 fL — ABNORMAL LOW (ref 78.0–100.0)
Platelets: 260 10*3/uL (ref 150–400)
RBC: 4.13 MIL/uL — ABNORMAL LOW (ref 4.22–5.81)
RDW: 16 % — ABNORMAL HIGH (ref 11.5–15.5)
WBC: 5.3 10*3/uL (ref 4.0–10.5)

## 2011-04-17 LAB — SURGICAL PCR SCREEN
MRSA, PCR: NEGATIVE
Staphylococcus aureus: POSITIVE — AB

## 2011-04-17 LAB — URIC ACID: Uric Acid, Serum: 8.6 mg/dL — ABNORMAL HIGH (ref 4.0–7.8)

## 2011-04-17 LAB — HEMOGLOBIN AND HEMATOCRIT, BLOOD
HCT: 31.7 % — ABNORMAL LOW (ref 39.0–52.0)
Hemoglobin: 8.8 g/dL — ABNORMAL LOW (ref 13.0–17.0)

## 2011-04-17 MED ORDER — FOLIC ACID 1 MG PO TABS
1.0000 mg | ORAL_TABLET | Freq: Every day | ORAL | Status: DC
  Administered 2011-04-18 – 2011-04-19 (×2): 1 mg via ORAL
  Filled 2011-04-17 (×2): qty 1

## 2011-04-17 MED ORDER — ACETAMINOPHEN 650 MG RE SUPP: 650.0000 mg | Freq: Four times a day (QID) | RECTAL | Status: DC | PRN

## 2011-04-17 MED ORDER — ALUM & MAG HYDROXIDE-SIMETH 200-200-20 MG/5ML PO SUSP: 30.0000 mL | Freq: Four times a day (QID) | ORAL | Status: DC | PRN

## 2011-04-17 MED ORDER — OXYCODONE HCL 5 MG PO TABS
ORAL_TABLET | ORAL | Status: AC
  Filled 2011-04-17: qty 1

## 2011-04-17 MED ORDER — ZOLPIDEM TARTRATE 10 MG PO TABS: 10.0000 mg | ORAL_TABLET | Freq: Every evening | ORAL | Status: DC | PRN

## 2011-04-17 MED ORDER — POLYETHYLENE GLYCOL 3350 17 G PO PACK
17.0000 g | PACK | Freq: Every day | ORAL | Status: DC
  Administered 2011-04-17: 12:00:00 via ORAL
  Administered 2011-04-18: 17 g via ORAL
  Filled 2011-04-17 (×3): qty 1

## 2011-04-17 MED ORDER — INFLUENZA VIRUS VACC SPLIT PF IM SUSP
0.5000 mL | INTRAMUSCULAR | Status: AC
  Administered 2011-04-17: 0.5 mL via INTRAMUSCULAR
  Filled 2011-04-17: qty 0.5

## 2011-04-17 MED ORDER — VITAMIN B-1 100 MG PO TABS
100.0000 mg | ORAL_TABLET | Freq: Every day | ORAL | Status: DC
  Administered 2011-04-18 – 2011-04-19 (×2): 100 mg via ORAL
  Filled 2011-04-17 (×2): qty 1

## 2011-04-17 MED ORDER — SODIUM CHLORIDE 0.9 % IJ SOLN
3.0000 mL | Freq: Two times a day (BID) | INTRAMUSCULAR | Status: DC
  Administered 2011-04-17 – 2011-04-19 (×3): 3 mL via INTRAVENOUS

## 2011-04-17 MED ORDER — SODIUM CHLORIDE 0.9 % IJ SOLN: 3.0000 mL | INTRAMUSCULAR | Status: DC | PRN

## 2011-04-17 MED ORDER — SODIUM CHLORIDE 0.9 % IV SOLN
250.0000 mL | INTRAVENOUS | Status: DC
  Administered 2011-04-17 – 2011-04-18 (×2): 250 mL via INTRAVENOUS

## 2011-04-17 MED ORDER — COLCHICINE 0.6 MG PO TABS
0.6000 mg | ORAL_TABLET | Freq: Two times a day (BID) | ORAL | Status: DC
  Administered 2011-04-17 – 2011-04-19 (×5): 0.6 mg via ORAL
  Filled 2011-04-17 (×6): qty 1

## 2011-04-17 MED ORDER — ACETAMINOPHEN 325 MG PO TABS: 650.0000 mg | ORAL_TABLET | Freq: Four times a day (QID) | ORAL | Status: DC | PRN

## 2011-04-17 MED ORDER — HYDROMORPHONE HCL PF 1 MG/ML IJ SOLN
1.0000 mg | INTRAMUSCULAR | Status: DC | PRN
Start: 2011-04-17 — End: 2011-04-19
  Administered 2011-04-17 – 2011-04-19 (×11): 1 mg via INTRAVENOUS
  Filled 2011-04-17 (×11): qty 1

## 2011-04-17 MED ORDER — THIAMINE HCL 100 MG/ML IJ SOLN
Freq: Once | INTRAVENOUS | Status: AC
  Administered 2011-04-17: 12:00:00 via INTRAVENOUS
  Filled 2011-04-17: qty 1000

## 2011-04-17 MED ORDER — DICLOFENAC SODIUM 1 % TD GEL
Freq: Four times a day (QID) | TRANSDERMAL | Status: DC
  Administered 2011-04-17: 15:00:00 via TOPICAL
  Administered 2011-04-17: 1 via TOPICAL
  Administered 2011-04-17 – 2011-04-19 (×7): via TOPICAL
  Filled 2011-04-17: qty 100

## 2011-04-17 MED ORDER — SODIUM CHLORIDE 0.9 % IV SOLN: INTRAVENOUS | Status: AC

## 2011-04-17 MED ORDER — ONDANSETRON HCL 4 MG PO TABS: 4.0000 mg | ORAL_TABLET | Freq: Four times a day (QID) | ORAL | Status: DC | PRN

## 2011-04-17 MED ORDER — ONDANSETRON HCL 4 MG/2ML IJ SOLN: 4.0000 mg | Freq: Four times a day (QID) | INTRAMUSCULAR | Status: DC | PRN

## 2011-04-17 MED ORDER — OXYCODONE HCL 5 MG PO TABS
5.0000 mg | ORAL_TABLET | ORAL | Status: DC | PRN
  Administered 2011-04-17 – 2011-04-18 (×3): 5 mg via ORAL
  Filled 2011-04-17: qty 1

## 2011-04-17 MED ORDER — FERROUS SULFATE 325 (65 FE) MG PO TABS
325.0000 mg | ORAL_TABLET | Freq: Two times a day (BID) | ORAL | Status: DC
  Administered 2011-04-17 – 2011-04-19 (×4): 325 mg via ORAL
  Filled 2011-04-17 (×7): qty 1

## 2011-04-17 MED ORDER — SENNA 8.6 MG PO TABS: 2.0000 | ORAL_TABLET | Freq: Every day | ORAL | Status: DC | PRN

## 2011-04-17 MED ORDER — DOCUSATE SODIUM 100 MG PO CAPS
100.0000 mg | ORAL_CAPSULE | Freq: Two times a day (BID) | ORAL | Status: DC
  Administered 2011-04-17 – 2011-04-19 (×5): 100 mg via ORAL
  Filled 2011-04-17 (×6): qty 1

## 2011-04-17 MED ORDER — HYDROCORTISONE ACETATE 25 MG RE SUPP
25.0000 mg | Freq: Two times a day (BID) | RECTAL | Status: DC
  Administered 2011-04-17 – 2011-04-18 (×2): 25 mg via RECTAL
  Filled 2011-04-17 (×6): qty 1

## 2011-04-17 MED ORDER — INFLUENZA VAC TYP A&B SURF ANT IM INJ
0.5000 mL | INJECTION | INTRAMUSCULAR | Status: AC
  Filled 2011-04-17: qty 0.5

## 2011-04-17 MED ORDER — LORAZEPAM 1 MG PO TABS
1.0000 mg | ORAL_TABLET | ORAL | Status: DC | PRN
  Administered 2011-04-17: 1 mg via ORAL
  Filled 2011-04-17: qty 1

## 2011-04-17 NOTE — ED Notes (Signed)
Able to give report to Diane RN at this time.

## 2011-04-17 NOTE — Progress Notes (Signed)
Patient screened by Clinical Social Worker on today for psychosocial needs . As a result, patient will receive a full psychosocial assessment within the next business day. Wayne [name] 04/17/2011
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
## 2011-04-17 NOTE — ED Notes (Addendum)
Pt c/o right knee pain. Pt has hx of gout in knee. Pt states he had rx for antiinflammatory but rx was too old to fill. Pt also wants blood counts checked due to bleeding hemorrhoids.
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
## 2011-04-17 NOTE — ED Notes (Signed)
Attempted to call report to [phone] x 2. No nurse available to take report. Floor nurse to return call

## 2011-04-18 ENCOUNTER — Inpatient Hospital Stay (HOSPITAL_COMMUNITY): Payer: Self-pay

## 2011-04-18 LAB — CBC
HCT: 32 % — ABNORMAL LOW (ref 39.0–52.0)
Hemoglobin: 9.1 g/dL — ABNORMAL LOW (ref 13.0–17.0)
MCH: 21.2 pg — ABNORMAL LOW (ref 26.0–34.0)
MCHC: 28.4 g/dL — ABNORMAL LOW (ref 30.0–36.0)
MCV: 74.6 fL — ABNORMAL LOW (ref 78.0–100.0)
Platelets: 250 10*3/uL (ref 150–400)
RBC: 4.29 MIL/uL (ref 4.22–5.81)
RDW: 16 % — ABNORMAL HIGH (ref 11.5–15.5)
WBC: 5.7 10*3/uL (ref 4.0–10.5)

## 2011-04-18 LAB — COMPREHENSIVE METABOLIC PANEL
ALT: 19 U/L (ref 0–53)
AST: 19 U/L (ref 0–37)
Albumin: 3.5 g/dL (ref 3.5–5.2)
Alkaline Phosphatase: 55 U/L (ref 39–117)
BUN: 9 mg/dL (ref 6–23)
CO2: 25 mEq/L (ref 19–32)
Calcium: 9.3 mg/dL (ref 8.4–10.5)
Chloride: 102 mEq/L (ref 96–112)
Creatinine, Ser: 1.01 mg/dL (ref 0.50–1.35)
GFR calc Af Amer: >90 mL/min (ref >90)
GFR calc non Af Amer: >90 mL/min (ref >90)
Glucose, Bld: 110 mg/dL — ABNORMAL HIGH (ref 70–99)
Potassium: 3.7 mEq/L (ref 3.5–5.1)
Sodium: 136 mEq/L (ref 135–145)
Total Bilirubin: 0.3 mg/dL (ref 0.3–1.2)
Total Protein: 7.2 g/dL (ref 6.0–8.3)

## 2011-04-18 LAB — SYNOVIAL FLUID, CRYSTAL: Crystals, Fluid: NONE SEEN

## 2011-04-18 LAB — VITAMIN B12: Vitamin B-12: 516 pg/mL (ref 211–911)

## 2011-04-18 MED ORDER — DIPHENHYDRAMINE HCL 25 MG PO CAPS
25.0000 mg | ORAL_CAPSULE | Freq: Four times a day (QID) | ORAL | Status: DC | PRN
  Administered 2011-04-18: 25 mg via ORAL
  Filled 2011-04-18: qty 1

## 2011-04-18 MED ORDER — IOHEXOL 300 MG/ML  SOLN
5.0000 mL | Freq: Once | INTRAMUSCULAR | Status: AC | PRN
Start: 2011-04-18 — End: 2011-04-18
  Administered 2011-04-18: 5 mL

## 2011-04-18 MED ORDER — METHYLPREDNISOLONE SODIUM SUCC 40 MG IJ SOLR
40.0000 mg | Freq: Every day | INTRAMUSCULAR | Status: AC
  Administered 2011-04-18: 40 mg via INTRAVENOUS
  Filled 2011-04-18: qty 1

## 2011-04-18 NOTE — Progress Notes (Addendum)
Subjective: Wayne [name] continues to have significant right knee pain. He has not had any appreciable relief with colchicine and the narcotics only relieved his pain for short periods of time.  Objective: Vital signs in last 24 hours: Temp:  [97.7 F (36.5 C)-98.5 F (36.9 C)] 98.5 F (36.9 C) (11/09 0605) Pulse Rate:  [103-104] 103  (11/09 0605) Resp:  [18-20] 20  (11/09 0605) BP: (125-130)/(76-88) 125/79 mmHg (11/09 0605) SpO2:  [94 %-98 %] 94 % (11/09 0605) Weight:  [126.667 kg (279 lb 4 oz)] 279 lb 4 oz (126.667 kg) (11/08 1012) Weight change: -2.733 kg (-6 lb 0.4 oz) Last BM Date: 04/16/11  Intake/Output from previous day: 11/08 0701 - 11/09 0700 In: 2826.7 [P.O.:1715; I.V.:1111.7] Out: 1850 [Urine:1850]     Physical Exam: General Appearance:    Alert, cooperative, no distress, appears stated age  Lungs:     Clear to auscultation bilaterally, respirations unlabored   Heart:    Regular rate and rhythm, S1 and S2 normal, no murmur, rub   or gallop  Abdomen:     Soft, non-tender, bowel sounds active all four quadrants,    no masses, no organomegaly  Extremities:   Right knee hot to touch, tender, otherwise atraumatic, no cyanosis or edema  Pulses:   2+ and symmetric all extremities     Lab Results: Results for orders placed during the hospital encounter of 04/17/11 (from the past 48 hour(s))  CBC     Status: Abnormal   Collection Time   04/17/11  2:35 AM      Component Value Range Comment   WBC 5.3  4.0 - 10.5 (K/uL)    RBC 4.13 (*) 4.22 - 5.81 (MIL/uL)    Hemoglobin 8.7 (*) 13.0 - 17.0 (g/dL)    HCT 16.1 (*) 09.6 - 52.0 (%)    MCV 73.6 (*) 78.0 - 100.0 (fL)    MCH 21.1 (*) 26.0 - 34.0 (pg)    MCHC 28.6 (*) 30.0 - 36.0 (g/dL)    RDW 04.5 (*) 40.9 - 15.5 (%)    Platelets 260  150 - 400 (K/uL)   OCCULT BLOOD X 1 CARD TO LAB, STOOL     Status: Normal   Collection Time   04/17/11  3:08 AM      Component Value Range Comment   Fecal Occult Bld POSITIVE     SURGICAL PCR  SCREEN     Status: Abnormal   Collection Time   04/17/11  6:40 AM      Component Value Range Comment   MRSA, PCR NEGATIVE  NEGATIVE     Staphylococcus aureus POSITIVE (*) NEGATIVE    HEMOGLOBIN AND HEMATOCRIT, BLOOD     Status: Abnormal   Collection Time   04/17/11  6:08 PM      Component Value Range Comment   Hemoglobin 8.8 (*) 13.0 - 17.0 (g/dL)    HCT 81.1 (*) 91.4 - 52.0 (%)   VITAMIN B12     Status: Normal   Collection Time   04/17/11  6:08 PM      Component Value Range Comment   Vitamin B-12 516  211 - 911 (pg/mL)   URIC ACID     Status: Abnormal   Collection Time   04/17/11  6:08 PM      Component Value Range Comment   Uric Acid, Serum 8.6 (*) 4.0 - 7.8 (mg/dL)   COMPREHENSIVE METABOLIC PANEL     Status: Abnormal   Collection Time  04/18/11  4:10 AM      Component Value Range Comment   Sodium 136  135 - 145 (mEq/L)    Potassium 3.7  3.5 - 5.1 (mEq/L)    Chloride 102  96 - 112 (mEq/L)    CO2 25  19 - 32 (mEq/L)    Glucose, Bld 110 (*) 70 - 99 (mg/dL)    BUN 9  6 - 23 (mg/dL)    Creatinine, Ser 1.61  0.50 - 1.35 (mg/dL)    Calcium 9.3  8.4 - 10.5 (mg/dL)    Total Protein 7.2  6.0 - 8.3 (g/dL)    Albumin 3.5  3.5 - 5.2 (g/dL)    AST 19  0 - 37 (U/L)    ALT 19  0 - 53 (U/L)    Alkaline Phosphatase 55  39 - 117 (U/L)    Total Bilirubin 0.3  0.3 - 1.2 (mg/dL)    GFR calc non Af Amer >90  >90 (mL/min)    GFR calc Af Amer >90  >90 (mL/min)   CBC     Status: Abnormal   Collection Time   04/18/11  4:10 AM      Component Value Range Comment   WBC 5.7  4.0 - 10.5 (K/uL)    RBC 4.29  4.22 - 5.81 (MIL/uL)    Hemoglobin 9.1 (*) 13.0 - 17.0 (g/dL)    HCT 09.6 (*) 04.5 - 52.0 (%)    MCV 74.6 (*) 78.0 - 100.0 (fL)    MCH 21.2 (*) 26.0 - 34.0 (pg)    MCHC 28.4 (*) 30.0 - 36.0 (g/dL)    RDW 40.9 (*) 81.1 - 15.5 (%)    Platelets 250  150 - 400 (K/uL)    Recent Results (from the past 240 hour(s))  SURGICAL PCR SCREEN     Status: Abnormal   Collection Time   04/17/11  6:40 AM       Component Value Range Status Comment   MRSA, PCR NEGATIVE  NEGATIVE  Final    Staphylococcus aureus POSITIVE (*) NEGATIVE  Final     Studies/Results: No results found.  Medications: Scheduled Meds:   . sodium chloride   Intravenous STAT  . colchicine  0.6 mg Oral BID  . diclofenac sodium   Topical QID  . docusate sodium  100 mg Oral BID  . ferrous sulfate  325 mg Oral BID WC  . folic acid  1 mg Oral Daily  . hydrocortisone  25 mg Rectal BID  . influenza  inactive virus vaccine  0.5 mL Intramuscular Tomorrow-1000  . influenza (>/= 3 years) inactive virus vaccine  0.5 mL Intramuscular Tomorrow-1000  . oxyCODONE      . polyethylene glycol  17 g Oral Daily  . general admission iv infusion   Intravenous Once  . sodium chloride  3 mL Intravenous Q12H  . thiamine  100 mg Oral Daily   Continuous Infusions:   . sodium chloride 250 mL (04/17/11 2326)   PRN Meds:.acetaminophen, acetaminophen, alum & mag hydroxide-simeth, HYDROmorphone, LORazepam, ondansetron (ZOFRAN) IV, ondansetron, oxyCODONE, senna, sodium chloride, zolpidem  Assessment/Plan:  Principal Problem:  *Swelling of right knee joint: The differential includes gout versus osteoarthritis versus septic knee. Patient does not have any fever and his white blood cell count is normal making septic knee a little less likely. This point, we have asked the interventional radiologist to perform an arthrocentesis and send the synovial fluid for crystal analysis. We will also send the synovial fluid  for Gram stain and culture to rule out septic knee although this is not likely. Serum uric acid level is a little high at 8.6.  We started him empirically on colchicine. Given his current GI bleeding, would try to avoid anti-inflammatories but will go ahead and give him a dose of IV Solu-Medrol due to his exquisite pain Active Problems:  Chronic GI bleeding: The patient has a microcytic anemia and a known history of chronically bleeding  internal hemorrhoids. We have put him on iron and we will continue to monitor his hemoglobin and hematocrit closely. If his hemoglobin begins to fall precipitously or if his hemoglobin falls to below 7 mg/dL, will transfuse.  Alcohol abuse: The patient is at high-risk for withdrawal. He denies any current problems with tremulousness or any symptoms suggestive of withdrawal. He states that he has not gone through withdrawal in the past when he has stopped drinking although on his previous admission, he was noted to have physical symptoms suggestive of withdrawal including tachycardia and hypertension. When necessary Ativan has been ordered. Internal hemorrhoids: We'll start the patient on hydrocortisone suppositories and monitor his hemoglobin and hematocrit closey. Recommend followup with a general surgeon for definitive treatment.  Iron deficiency anemia: Start the patient on iron supplementation and monitor his hemoglobin closely.  B12 deficiency: We have checked his B12 level and it was within the normal range. Constipation: We have placed the patient on a bowel regimen with stool softeners.  Obesity: We have consulted the dietitian for diet education.      LOS: 1 day   Wayne [name] 04/18/2011, 8:17 AM

## 2011-04-18 NOTE — Progress Notes (Signed)
Spoke with patient at bedside. States he lives at home with family, no issues with transportation. Has been paying for meds out of pocket to date, states has not had anything long term, gets scripts from ED. Discussed options for obtaining a PCP, offered clinics in area, offered [facility] to access PCP. Patient wanted to use [facility], provided #. Patient understands to call the number prior to d/c to obtain PCP appt. States may need assistance with meds, can afford meds if on $3-4 list.

## 2011-04-18 NOTE — Progress Notes (Signed)
Mr. Verbrugge was seen by me and assessed for alcohol abuse. The SBIRT assessment was completed. Brief intervention included education and resources.  Gretta Cool Antoin 04/18/2011

## 2011-04-18 NOTE — Procedures (Signed)
Tried lateral approach first without fluid obtained.  Inferior approach yielded 2-3cc clear yellow fluid.

## 2011-04-18 NOTE — Consult Note (Signed)
Nutrition education, topic: obesity  Nutrition dx:  Nutrition-related knowledge deficit r/t diet therapy AEB MD/nursing request  Intervention:  Brief education;  Provided.  Goals of nutrition therapy discussed.  Understanding confirmed.  RD contact information provided.  Monitoring:  Knowledge; for questions.  Please consult RD if new questions present.  Pager:  [phone]

## 2011-04-19 LAB — CBC
HCT: 31.1 % — ABNORMAL LOW (ref 39.0–52.0)
Hemoglobin: 8.6 g/dL — ABNORMAL LOW (ref 13.0–17.0)
MCH: 20.9 pg — ABNORMAL LOW (ref 26.0–34.0)
MCHC: 27.7 g/dL — ABNORMAL LOW (ref 30.0–36.0)
MCV: 75.7 fL — ABNORMAL LOW (ref 78.0–100.0)
Platelets: 254 10*3/uL (ref 150–400)
RBC: 4.11 MIL/uL — ABNORMAL LOW (ref 4.22–5.81)
RDW: 16.1 % — ABNORMAL HIGH (ref 11.5–15.5)
WBC: 8.3 10*3/uL (ref 4.0–10.5)

## 2011-04-19 LAB — BASIC METABOLIC PANEL
BUN: 9 mg/dL (ref 6–23)
CO2: 25 mEq/L (ref 19–32)
Calcium: 9.2 mg/dL (ref 8.4–10.5)
Chloride: 102 mEq/L (ref 96–112)
Creatinine, Ser: 0.88 mg/dL (ref 0.50–1.35)
GFR calc Af Amer: >90 mL/min (ref >90)
GFR calc non Af Amer: >90 mL/min (ref >90)
Glucose, Bld: 157 mg/dL — ABNORMAL HIGH (ref 70–99)
Potassium: 4.1 mEq/L (ref 3.5–5.1)
Sodium: 135 mEq/L (ref 135–145)

## 2011-04-19 MED ORDER — HYDROCORTISONE ACETATE 25 MG RE SUPP: 25.0000 mg | Freq: Two times a day (BID) | RECTAL | Status: AC | PRN

## 2011-04-19 MED ORDER — OXYCODONE HCL 5 MG PO TABS: 5.0000 mg | ORAL_TABLET | ORAL | Status: AC | PRN

## 2011-04-19 MED ORDER — DICLOFENAC SODIUM 1 % TD GEL: 1.0000 "application " | Freq: Four times a day (QID) | TRANSDERMAL | Status: DC | PRN

## 2011-04-19 MED ORDER — FERROUS SULFATE 325 (65 FE) MG PO TABS: 325.0000 mg | ORAL_TABLET | Freq: Two times a day (BID) | ORAL | Status: DC

## 2011-04-19 MED ORDER — DIPHENHYDRAMINE HCL 50 MG/ML IJ SOLN: 25.0000 mg | Freq: Four times a day (QID) | INTRAMUSCULAR | Status: DC | PRN

## 2011-04-19 MED ORDER — METHYLPREDNISOLONE SODIUM SUCC 125 MG IJ SOLR
60.0000 mg | Freq: Every day | INTRAMUSCULAR | Status: AC
  Administered 2011-04-19: 60 mg via INTRAVENOUS
  Filled 2011-04-19: qty 0.96

## 2011-04-19 MED ORDER — PREDNISONE (PAK) 10 MG PO TABS: ORAL_TABLET | ORAL | Status: AC

## 2011-04-19 MED ORDER — COLCHICINE 0.6 MG PO TABS: 0.6000 mg | ORAL_TABLET | Freq: Three times a day (TID) | ORAL | Status: DC | PRN

## 2011-04-19 NOTE — Plan of Care (Signed)
Problem: Consults Goal: General Medical Patient Education See Patient Education Module for specific education. Outcome: Progressing Pt teaching regarding lab results, medications and treatments.

## 2011-04-19 NOTE — Discharge Summary (Signed)
Physician Discharge Summary  Patient ID: Wayne [name] MRN: [id number] DOB/AGE: 09/28/1975 35 y.o.  Admit date: 04/17/2011 Discharge date: 04/19/2011  Primary Care Physician:  None.  The patient was instructed to call [facility] to establish care with a PCP.  Discharge Diagnoses:    Present on Admission:  .Swelling of right knee joint .Internal hemorrhoids .Chronic GI bleeding .Obesity .Alcohol abuse .B12 deficiency .Constipation  Discharge Medications:  Current Discharge Medication List    START taking these medications   Details  colchicine 0.6 MG tablet Take 1 tablet (0.6 mg total) by mouth 3 (three) times daily as needed. Qty: 90 tablet, Refills: 2    diclofenac sodium (VOLTAREN) 1 % GEL Apply 1 application topically 4 (four) times daily as needed. Qty: 1 Tube, Refills: 3    ferrous sulfate 325 (65 FE) MG tablet Take 1 tablet (325 mg total) by mouth 2 (two) times daily with a meal. Qty: 60 tablet, Refills: 2    hydrocortisone (ANUSOL-HC) 25 MG suppository Place 1 suppository (25 mg total) rectally 2 (two) times daily as needed for hemorrhoids. Qty: 12 suppository, Refills: 3    oxyCODONE (OXY IR/ROXICODONE) 5 MG immediate release tablet Take 1 tablet (5 mg total) by mouth every 4 (four) hours as needed for pain. Qty: 30 tablet, Refills: 0    predniSONE (STERAPRED UNI-PAK) 10 MG tablet Take 60 mg on 11/11, then decrease dose by 10 mg a day until off. Qty: 21 tablet, Refills: 0      CONTINUE these medications which have NOT CHANGED   Details  OVER THE COUNTER MEDICATION Take 1 Package by mouth daily. [employer] omega mens sport pack     PSYLLIUM PO Take 15 capsules by mouth daily.           Disposition and Follow-up: The patient is being discharged home. He is instructed to call [facility] to establish care with a primary care physician. He has been provided with the number of a local surgery group to followup with regard to his internal  hemorrhoids.  Consults: None.   Significant Diagnostic Studies:   Dg Fluoro Guide Ndl Plc/bx  04/18/2011  Clinical Data: Right knee arthrocentesis, please send synovial fluid for gram stain, culture and crystal analysis.;  INTERMITTENT RIGHT KNEE PAIN.  FLUORO GUIDED NEEDLE PLACEMENT  Comparison: Knee films from 04/22/2010.  Findings:  I discussed the risks, benefits, and alternatives to knee aspiration with the patient prior to the procedure. Emphasized risks included bleeding, infection, and inability to obtain fluid.  We discussed high likelihood that the procedure would be successful.  The patient voiced understanding and wished to proceed.  Written informed consent was obtained.  Using fluoroscopic guidance, an appropriate skin site was identified over the lateral knee.  This area of skin was prepped and draped in the usual sterile fashion.  Anesthesia of the skin and deeper soft tissues was obtained using 1% lidocaine without epinephrine.  Using a standard 22 gauge spinal needle, initial attempt was made to  access the joint space using a lateral approach in the patellofemoral groove, without success.  A second approach was used coming inferomedially along the patellar tendon.  Using this approach, contrast localization confirmed intra- articular positioning of the needle.  Joint fluid could not be aspirated.  A small volume of sterile normal saline was injected into the joint as lavage, and 3-5 ml of clear yellowish serosanguineous fluid was aspirated.  Marland Kitchen  IMPRESSION: Successful fluid aspiration from the right knee.  The procedure  was well tolerated without evidence for immediate complications.  Original Report Authenticated By: ERIC A. MANSELL, M.D.    Discharge Laboratory Values: Results for orders placed during the hospital encounter of 04/17/11 (from the past 48 hour(s))  HEMOGLOBIN AND HEMATOCRIT, BLOOD     Status: Abnormal   Collection Time   04/17/11  6:08 PM      Component Value Range  Comment   Hemoglobin 8.8 (*) 13.0 - 17.0 (g/dL)    HCT 16.1 (*) 09.6 - 52.0 (%)   VITAMIN B12     Status: Normal   Collection Time   04/17/11  6:08 PM      Component Value Range Comment   Vitamin B-12 516  211 - 911 (pg/mL)   URIC ACID     Status: Abnormal   Collection Time   04/17/11  6:08 PM      Component Value Range Comment   Uric Acid, Serum 8.6 (*) 4.0 - 7.8 (mg/dL)   COMPREHENSIVE METABOLIC PANEL     Status: Abnormal   Collection Time   04/18/11  4:10 AM      Component Value Range Comment   Sodium 136  135 - 145 (mEq/L)    Potassium 3.7  3.5 - 5.1 (mEq/L)    Chloride 102  96 - 112 (mEq/L)    CO2 25  19 - 32 (mEq/L)    Glucose, Bld 110 (*) 70 - 99 (mg/dL)    BUN 9  6 - 23 (mg/dL)    Creatinine, Ser 0.45  0.50 - 1.35 (mg/dL)    Calcium 9.3  8.4 - 10.5 (mg/dL)    Total Protein 7.2  6.0 - 8.3 (g/dL)    Albumin 3.5  3.5 - 5.2 (g/dL)    AST 19  0 - 37 (U/L)    ALT 19  0 - 53 (U/L)    Alkaline Phosphatase 55  39 - 117 (U/L)    Total Bilirubin 0.3  0.3 - 1.2 (mg/dL)    GFR calc non Af Amer >90  >90 (mL/min)    GFR calc Af Amer >90  >90 (mL/min)   CBC     Status: Abnormal   Collection Time   04/18/11  4:10 AM      Component Value Range Comment   WBC 5.7  4.0 - 10.5 (K/uL)    RBC 4.29  4.22 - 5.81 (MIL/uL)    Hemoglobin 9.1 (*) 13.0 - 17.0 (g/dL)    HCT 40.9 (*) 81.1 - 52.0 (%)    MCV 74.6 (*) 78.0 - 100.0 (fL)    MCH 21.2 (*) 26.0 - 34.0 (pg)    MCHC 28.4 (*) 30.0 - 36.0 (g/dL)    RDW 91.4 (*) 78.2 - 15.5 (%)    Platelets 250  150 - 400 (K/uL)   SYNOVIAL FLUID, CRYSTAL     Status: Normal   Collection Time   04/18/11  1:00 PM      Component Value Range Comment   Crystals, Fluid NO CRYSTALS SEEN     BODY FLUID CULTURE     Status: Normal (Preliminary result)   Collection Time   04/18/11  1:00 PM      Component Value Range Comment   Specimen Description SYNOVIAL KNEE RIGHT      Special Requests NONE      Gram Stain        Value: RARE WBC PRESENT,BOTH PMN AND MONONUCLEAR      NO ORGANISMS SEEN   Culture PENDING  Report Status PENDING     BASIC METABOLIC PANEL     Status: Abnormal   Collection Time   04/19/11  4:15 AM      Component Value Range Comment   Sodium 135  135 - 145 (mEq/L)    Potassium 4.1  3.5 - 5.1 (mEq/L)    Chloride 102  96 - 112 (mEq/L)    CO2 25  19 - 32 (mEq/L)    Glucose, Bld 157 (*) 70 - 99 (mg/dL)    BUN 9  6 - 23 (mg/dL)    Creatinine, Ser 1.61  0.50 - 1.35 (mg/dL)    Calcium 9.2  8.4 - 10.5 (mg/dL)    GFR calc non Af Amer >90  >90 (mL/min)    GFR calc Af Amer >90  >90 (mL/min)   CBC     Status: Abnormal   Collection Time   04/19/11  4:15 AM      Component Value Range Comment   WBC 8.3  4.0 - 10.5 (K/uL)    RBC 4.11 (*) 4.22 - 5.81 (MIL/uL)    Hemoglobin 8.6 (*) 13.0 - 17.0 (g/dL)    HCT 09.6 (*) 04.5 - 52.0 (%)    MCV 75.7 (*) 78.0 - 100.0 (fL)    MCH 20.9 (*) 26.0 - 34.0 (pg)    MCHC 27.7 (*) 30.0 - 36.0 (g/dL)    RDW 40.9 (*) 81.1 - 15.5 (%)    Platelets 254  150 - 400 (K/uL)     Brief H and P: For complete details please refer to admission H and P, but in brief Mr. Lad is a 35 year old male with a past medical history of right knee swelling that was attributed to osteoarthritis versus gout and chronic GI blood loss secondary to internal hemorrhoids and chronic constipation and has required blood transfusion in the past. The patient presented to the [place] emergency department with severe right knee pain for the past 2 days. Upon initial evaluation in the emergency department, the patient was noted to be anemic with a hemoglobin of 8.7. He therefore was referred to the hospitalist service for further evaluation and treatment.   Physical Exam at Discharge: BP 130/68  Pulse 81  Temp(Src) 97.8 F (36.6 C) (Oral)  Resp 18  Ht 5\' 10"  (1.778 m)  Wt 126.667 kg (279 lb 4 oz)  BMI 40.07 kg/m2  SpO2 99%  General Appearance:  Alert, cooperative, no distress, appears stated age   Lungs:  Clear to auscultation  bilaterally, respirations unlabored   Heart:  Regular rate and rhythm, S1 and S2 normal, no murmur, rub  or gallop   Abdomen:  Soft, non-tender, bowel sounds active all four quadrants,  no masses, no organomegaly   Extremities:  Right knee less hot to touch, less tender, otherwise atraumatic, no cyanosis or edema   Pulses:  2+ and symmetric all extremities        Hospital Course:  Principal Problem:  *Swelling of right knee joint: The differential includeed gout versus osteoarthritis versus septic knee. The patient did not have any fever and his white blood cell count was normal making septic knee a little less likely. The interventional radiologist performed a right knee arthrocentesis and no crystals were found. The Gram stain did not show any organisms. The patient's serum uric acid level was slightly high at 8.6 and he was given one dose of Solu-Medrol before the knee tap. Patient reported improvement after receiving Solu-Medrol. It is unclear  if the Solu-Medrol may have treated suspected gout and therefore the synovial fluid analysis was negative. Patient's symptoms are certainly consistent with gout and he does report intermittent right great toe pain. This point, we'll discharge him on a prednisone taper, when necessary colchicine, and a short course of by mouth narcotics for immediate pain control. I've instructed him to avoid oral anti-inflammatory medications due to his risk of bleeding from chronic internal hemorrhoids.  Active Problems:  Chronic GI bleeding: The patient has a microcytic anemia and a known history of chronically bleeding internal hemorrhoids. We have put him on iron and his hematocrit and hemoglobin were relatively stable. He did not require blood transfusion. Alcohol abuse: The patient was felt to be at high-risk for withdrawal. He denied any current problems with tremulousness or any symptoms suggestive of withdrawal. He stated that he has not gone through withdrawal  in the past when he has stopped drinking although on his previous admission, he was noted to have physical symptoms suggestive of withdrawal including tachycardia and hypertension. Patient had no evidence of withdrawal while in the hospital. He was seen by the social worker and given a handout regarding resources for substance abuse issues. Internal hemorrhoids: The patient refused hydrocortisone suppositories because he did not have any active rectal bleeding. We will discharge him with a prescription for hydrocortisone suppositories to use as needed should he develop a flare of his internal hemorrhoids with rectal bleeding as an outpatient. Recommend followup with a general surgeon for definitive treatment.  Iron deficiency anemia: We started the patient on iron supplementation and monitored his hemoglobin closely.  B12 deficiency: We have checked his B12 level and it was within the normal range.  Constipation: We have placed the patient on a bowel regimen with stool softeners.  Obesity: We have consulted the dietitian for diet education.   Recommendations for hospital follow-up: 1.  Follow up with an orthopedic surgeon for non-resolution of symptoms. 2.  Follow up with a surgeon for definitive treatment of internal hemorrhoids.  Time spent on Discharge: 45 minutes  Signed: Konstantinos Cordoba 04/19/2011, 9:31 AM

## 2011-04-22 LAB — BODY FLUID CULTURE: Culture: NO GROWTH

## 2011-05-17 ENCOUNTER — Encounter (HOSPITAL_BASED_OUTPATIENT_CLINIC_OR_DEPARTMENT_OTHER): Payer: Self-pay

## 2011-05-17 ENCOUNTER — Emergency Department (HOSPITAL_BASED_OUTPATIENT_CLINIC_OR_DEPARTMENT_OTHER)
Admission: EM | Admit: 2011-05-17 | Discharge: 2011-05-17 | Disposition: A | Discharge status: Home / Self Care | Payer: Self-pay | Attending: Emergency Medicine | Admitting: Emergency Medicine

## 2011-05-17 DIAGNOSIS — M79673 Pain in unspecified foot: Secondary | ICD-10-CM

## 2011-05-17 DIAGNOSIS — Z79899 Other long term (current) drug therapy: Secondary | ICD-10-CM | POA: Insufficient documentation

## 2011-05-17 DIAGNOSIS — M109 Gout, unspecified: Secondary | ICD-10-CM | POA: Insufficient documentation

## 2011-05-17 DIAGNOSIS — M25579 Pain in unspecified ankle and joints of unspecified foot: Secondary | ICD-10-CM | POA: Insufficient documentation

## 2011-05-17 MED ORDER — IBUPROFEN 400 MG PO TABS
400.0000 mg | ORAL_TABLET | Freq: Once | ORAL | Status: AC
  Administered 2011-05-17: 400 mg via ORAL
  Filled 2011-05-17: qty 2

## 2011-05-17 MED ORDER — OXYCODONE-ACETAMINOPHEN 5-325 MG PO TABS: 1.0000 | ORAL_TABLET | ORAL | Status: AC | PRN

## 2011-05-17 MED ORDER — PREDNISONE 20 MG PO TABS: 40.0000 mg | ORAL_TABLET | Freq: Every day | ORAL | Status: DC

## 2011-05-17 MED ORDER — PREDNISONE 20 MG PO TABS
40.0000 mg | ORAL_TABLET | Freq: Once | ORAL | Status: AC
  Administered 2011-05-17: 40 mg via ORAL
  Filled 2011-05-17: qty 2

## 2011-05-17 MED ORDER — COLCHICINE 0.6 MG PO TABS: 0.6000 mg | ORAL_TABLET | Freq: Three times a day (TID) | ORAL | Status: DC | PRN
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
## 2011-05-17 NOTE — ED Notes (Signed)
Pt states that he has been having severe pain in his R knee, had fluid removed from that knee several days ago, and this past Monday or Tuesday he had onset of swelling and pain to the L foot.  Pt states that he believes that he has gout, is here for someone to tell him if that is the case.

## 2011-06-13 ENCOUNTER — Encounter (HOSPITAL_COMMUNITY): Payer: Self-pay | Admitting: Emergency Medicine

## 2011-06-13 ENCOUNTER — Emergency Department (HOSPITAL_COMMUNITY)
Admission: EM | Admit: 2011-06-13 | Discharge: 2011-06-13 | Disposition: A | Discharge status: Home / Self Care | Payer: Self-pay | Attending: Emergency Medicine | Admitting: Emergency Medicine

## 2011-06-13 DIAGNOSIS — M25476 Effusion, unspecified foot: Secondary | ICD-10-CM | POA: Insufficient documentation

## 2011-06-13 DIAGNOSIS — M109 Gout, unspecified: Secondary | ICD-10-CM | POA: Insufficient documentation

## 2011-06-13 DIAGNOSIS — M79609 Pain in unspecified limb: Secondary | ICD-10-CM | POA: Insufficient documentation

## 2011-06-13 DIAGNOSIS — M25473 Effusion, unspecified ankle: Secondary | ICD-10-CM | POA: Insufficient documentation

## 2011-06-13 LAB — COMPREHENSIVE METABOLIC PANEL
ALT: 22 U/L (ref 0–53)
AST: 22 U/L (ref 0–37)
Albumin: 3.7 g/dL (ref 3.5–5.2)
Alkaline Phosphatase: 50 U/L (ref 39–117)
BUN: 14 mg/dL (ref 6–23)
CO2: 21 mEq/L (ref 19–32)
Calcium: 8.8 mg/dL (ref 8.4–10.5)
Chloride: 105 mEq/L (ref 96–112)
Creatinine, Ser: 1.02 mg/dL (ref 0.50–1.35)
GFR calc Af Amer: >90 mL/min (ref >90)
GFR calc non Af Amer: >90 mL/min (ref >90)
Glucose, Bld: 105 mg/dL — ABNORMAL HIGH (ref 70–99)
Potassium: 3.2 mEq/L — ABNORMAL LOW (ref 3.5–5.1)
Sodium: 138 mEq/L (ref 135–145)
Total Bilirubin: 0.2 mg/dL — ABNORMAL LOW (ref 0.3–1.2)
Total Protein: 6.8 g/dL (ref 6.0–8.3)

## 2011-06-13 LAB — CBC
HCT: 32.2 % — ABNORMAL LOW (ref 39.0–52.0)
Hemoglobin: 10.1 g/dL — ABNORMAL LOW (ref 13.0–17.0)
MCH: 25.4 pg — ABNORMAL LOW (ref 26.0–34.0)
MCHC: 31.4 g/dL (ref 30.0–36.0)
MCV: 81.1 fL (ref 78.0–100.0)
Platelets: 195 10*3/uL (ref 150–400)
RBC: 3.97 MIL/uL — ABNORMAL LOW (ref 4.22–5.81)
RDW: 19.2 % — ABNORMAL HIGH (ref 11.5–15.5)
WBC: 4.4 10*3/uL (ref 4.0–10.5)

## 2011-06-13 LAB — DIFFERENTIAL
Basophils Absolute: 0 10*3/uL (ref 0.0–0.1)
Basophils Relative: 0 % (ref 0–1)
Eosinophils Absolute: 0.2 10*3/uL (ref 0.0–0.7)
Eosinophils Relative: 5 % (ref 0–5)
Lymphocytes Relative: 34 % (ref 12–46)
Lymphs Abs: 1.5 10*3/uL (ref 0.7–4.0)
Monocytes Absolute: 0.5 10*3/uL (ref 0.1–1.0)
Monocytes Relative: 12 % (ref 3–12)
Neutro Abs: 2.1 10*3/uL (ref 1.7–7.7)
Neutrophils Relative %: 49 % (ref 43–77)

## 2011-06-13 MED ORDER — POTASSIUM CHLORIDE CRYS ER 20 MEQ PO TBCR
40.0000 meq | EXTENDED_RELEASE_TABLET | Freq: Once | ORAL | Status: AC
  Administered 2011-06-13: 40 meq via ORAL
  Filled 2011-06-13: qty 2

## 2011-06-13 MED ORDER — OXYCODONE-ACETAMINOPHEN 5-325 MG PO TABS: 2.0000 | ORAL_TABLET | ORAL | Status: AC | PRN

## 2011-06-13 MED ORDER — PREDNISONE 20 MG PO TABS
60.0000 mg | ORAL_TABLET | Freq: Once | ORAL | Status: AC
  Administered 2011-06-13: 60 mg via ORAL
  Filled 2011-06-13: qty 3

## 2011-06-13 MED ORDER — PREDNISONE 10 MG PO TABS: 20.0000 mg | ORAL_TABLET | Freq: Every day | ORAL | Status: DC
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
## 2011-06-13 NOTE — ED Notes (Signed)
Pt is c/o left foot pain that started about 2 days ago  Pt has hx of gout  Pt states he has been having some dizziness and some numbness to his hands  Pt states he has a hx of low blood counts and wants them checked as well

## 2011-06-13 NOTE — ED Notes (Signed)
Pt presents with a c/c of dizziness, bilateral arm/hand numbness/tingling and foot pain (pt has gout).  Pt st's he has bleeding ulcers that he's supposed to have surgery to correct this month, st's he thinks they're bleeding and st's "I think my blood is low."

## 2011-06-26 ENCOUNTER — Emergency Department (HOSPITAL_COMMUNITY)
Admission: EM | Admit: 2011-06-26 | Discharge: 2011-06-26 | Disposition: A | Discharge status: Home / Self Care | Payer: Self-pay | Attending: Emergency Medicine | Admitting: Emergency Medicine

## 2011-06-26 ENCOUNTER — Encounter (HOSPITAL_COMMUNITY): Payer: Self-pay | Admitting: Emergency Medicine

## 2011-06-26 DIAGNOSIS — M109 Gout, unspecified: Secondary | ICD-10-CM | POA: Insufficient documentation

## 2011-06-26 DIAGNOSIS — Z79899 Other long term (current) drug therapy: Secondary | ICD-10-CM | POA: Insufficient documentation

## 2011-06-26 DIAGNOSIS — M79609 Pain in unspecified limb: Secondary | ICD-10-CM | POA: Insufficient documentation

## 2011-06-26 MED ORDER — INDOMETHACIN 25 MG PO CAPS: 25.0000 mg | ORAL_CAPSULE | Freq: Three times a day (TID) | ORAL | Status: AC | PRN

## 2011-06-26 MED ORDER — HYDROCODONE-ACETAMINOPHEN 5-325 MG PO TABS: 1.0000 | ORAL_TABLET | ORAL | Status: AC | PRN

## 2011-06-26 MED ORDER — PREDNISONE 10 MG PO TABS: 40.0000 mg | ORAL_TABLET | Freq: Every day | ORAL | Status: DC
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
## 2011-06-26 NOTE — ED Notes (Addendum)
Per Pt: Hx of Gout, pt has pain in left great toe, slight swelling per pt. Pt was unable to follow up with [facility] after last ED visit due to living in [facility]. Does not have a PCP. Pain is 6/10. Prescriptions have run out.

## 2011-06-26 NOTE — ED Notes (Signed)
Pt left with 3 prescriptions and walked to d/c window.

## 2011-06-26 NOTE — ED Provider Notes (Signed)
Medical screening examination/treatment/procedure(s) were performed by non-physician practitioner and as supervising physician I was immediately available for consultation/collaboration.  Charlsie Fleeger R. Lalena Salas, MD 06/26/11 2307 

## 2011-11-14 ENCOUNTER — Emergency Department (HOSPITAL_COMMUNITY)
Admission: EM | Admit: 2011-11-14 | Discharge: 2011-11-14 | Disposition: A | Discharge status: Home / Self Care | Payer: Self-pay | Attending: Emergency Medicine | Admitting: Emergency Medicine

## 2011-11-14 ENCOUNTER — Encounter (HOSPITAL_COMMUNITY): Payer: Self-pay | Admitting: Emergency Medicine

## 2011-11-14 DIAGNOSIS — D649 Anemia, unspecified: Secondary | ICD-10-CM | POA: Insufficient documentation

## 2011-11-14 DIAGNOSIS — M109 Gout, unspecified: Secondary | ICD-10-CM | POA: Insufficient documentation

## 2011-11-14 LAB — POCT I-STAT, CHEM 8
BUN: 9 mg/dL (ref 6–23)
Calcium, Ion: 1.24 mmol/L (ref 1.12–1.32)
Chloride: 108 mEq/L (ref 96–112)
Creatinine, Ser: 1.1 mg/dL (ref 0.50–1.35)
Glucose, Bld: 105 mg/dL — ABNORMAL HIGH (ref 70–99)
HCT: 33 % — ABNORMAL LOW (ref 39.0–52.0)
Hemoglobin: 11.2 g/dL — ABNORMAL LOW (ref 13.0–17.0)
Potassium: 4.4 mEq/L (ref 3.5–5.1)
Sodium: 142 mEq/L (ref 135–145)
TCO2: 22 mmol/L (ref 0–100)

## 2011-11-14 MED ORDER — PREDNISONE 10 MG PO TABS: 20.0000 mg | ORAL_TABLET | Freq: Every day | ORAL | Status: DC

## 2011-11-14 MED ORDER — HYDROCODONE-ACETAMINOPHEN 5-325 MG PO TABS: 1.0000 | ORAL_TABLET | ORAL | Status: AC | PRN

## 2011-11-14 MED ORDER — INDOMETHACIN 25 MG PO CAPS: 25.0000 mg | ORAL_CAPSULE | Freq: Three times a day (TID) | ORAL | Status: AC

## 2011-11-14 NOTE — ED Notes (Signed)
Pt reports pain in ll/great toe x 2 days

## 2011-11-14 NOTE — ED Provider Notes (Signed)
Medical screening examination/treatment/procedure(s) were performed by non-physician practitioner and as supervising physician I was immediately available for consultation/collaboration.  Cheri Guppy, MD 11/14/11 [phone]
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
## 2011-11-14 NOTE — Progress Notes (Signed)
Pt listed as self pay with no insurance coverage Pt confirms he is self pay [place] resident.  CM and [facility] community liaison spoke with him Pt offered [facility] services to assist with finding a [place] self pay provider

## 2011-11-14 NOTE — Discharge Instructions (Signed)
As we discussed, your anemia is improved.  Take the indomethacin first for pain. If this does not work, you can take the hydrocodone. If you get stomach upset even taking the indomethacin with food, you should stop taking it.    Gout Gout is an inflammatory condition (arthritis) caused by a buildup of uric acid crystals in the joints. Uric acid is a chemical that is normally present in the blood. Under some circumstances, uric acid can form into crystals in your joints. This causes joint redness, soreness, and swelling (inflammation). Repeat attacks are common. Over time, uric acid crystals can form into masses (tophi) near a joint, causing disfigurement. Gout is treatable and often preventable. CAUSES  The disease begins with elevated levels of uric acid in the blood. Uric acid is produced by your body when it breaks down a naturally found substance called purines. This also happens when you eat certain foods such as meats and fish. Causes of an elevated uric acid level include:  Being passed down from parent to child (heredity).   Diseases that cause increased uric acid production (obesity, psoriasis, some cancers).   Excessive alcohol use.   Diet, especially diets rich in meat and seafood.   Medicines, including certain cancer-fighting drugs (chemotherapy), diuretics, and aspirin.   Chronic kidney disease. The kidneys are no longer able to remove uric acid well.   Problems with metabolism.  Conditions strongly associated with gout include:  Obesity.   High blood pressure.   High cholesterol.   Diabetes.  Not everyone with elevated uric acid levels gets gout. It is not understood why some people get gout and others do not. Surgery, joint injury, and eating too much of certain foods are some of the factors that can lead to gout. SYMPTOMS   An attack of gout comes on quickly. It causes intense pain with redness, swelling, and warmth in a joint.   Fever can occur.   Often, only  one joint is involved. Certain joints are more commonly involved:   Base of the big toe.   Knee.   Ankle.   Wrist.   Finger.  Without treatment, an attack usually goes away in a few days to weeks. Between attacks, you usually will not have symptoms, which is different from many other forms of arthritis. DIAGNOSIS  Your caregiver will suspect gout based on your symptoms and exam. Removal of fluid from the joint (arthrocentesis) is done to check for uric acid crystals. Your caregiver will give you a medicine that numbs the area (local anesthetic) and use a needle to remove joint fluid for exam. Gout is confirmed when uric acid crystals are seen in joint fluid, using a special microscope. Sometimes, blood, urine, and X-ray tests are also used. TREATMENT  There are 2 phases to gout treatment: treating the sudden onset (acute) attack and preventing attacks (prophylaxis). Treatment of an Acute Attack  Medicines are used. These include anti-inflammatory medicines or steroid medicines.   An injection of steroid medicine into the affected joint is sometimes necessary.   The painful joint is rested. Movement can worsen the arthritis.   You may use warm or cold treatments on painful joints, depending which works best for you.   Discuss the use of coffee, vitamin C, or cherries with your caregiver. These may be helpful treatment options.  Treatment to Prevent Attacks After the acute attack subsides, your caregiver may advise prophylactic medicine. These medicines either help your kidneys eliminate uric acid from your body or decrease  your uric acid production. You may need to stay on these medicines for a very long time. The early phase of treatment with prophylactic medicine can be associated with an increase in acute gout attacks. For this reason, during the first few months of treatment, your caregiver may also advise you to take medicines usually used for acute gout treatment. Be sure you  understand your caregiver's directions. You should also discuss dietary treatment with your caregiver. Certain foods such as meats and fish can increase uric acid levels. Other foods such as dairy can decrease levels. Your caregiver can give you a list of foods to avoid. HOME CARE INSTRUCTIONS   Do not take aspirin to relieve pain. This raises uric acid levels.   Only take over-the-counter or prescription medicines for pain, discomfort, or fever as directed by your caregiver.   Rest the joint as much as possible. When in bed, keep sheets and blankets off painful areas.   Keep the affected joint raised (elevated).   Use crutches if the painful joint is in your leg.   Drink enough water and fluids to keep your urine clear or pale yellow. This helps your body get rid of uric acid. Do not drink alcoholic beverages. They slow the passage of uric acid.   Follow your caregiver's dietary instructions. Pay careful attention to the amount of protein you eat. Your daily diet should emphasize fruits, vegetables, whole grains, and fat-free or low-fat milk products.   Maintain a healthy body weight.  SEEK MEDICAL CARE IF:   You have an oral temperature above 102 F (38.9 C).   You develop diarrhea, vomiting, or any side effects from medicines.   You do not feel better in 24 hours, or you are getting worse.  SEEK IMMEDIATE MEDICAL CARE IF:   Your joint becomes suddenly more tender and you have:   Chills.   An oral temperature above 102 F (38.9 C), not controlled by medicine.  MAKE SURE YOU:   Understand these instructions.   Will watch your condition.   Will get help right away if you are not doing well or get worse.  Document Released: 05/23/2000 Document Revised: 05/15/2011 Document Reviewed: 09/03/2009 [facility] Patient Information 2012 [place], [place].        Purine Restricted Diet A low-purine diet consists of foods that reduce uric acid made in your body. INDICATIONS FOR  USE  Your caregiver may ask you to follow a low-purine diet to reduce gout flairs.  GUIDELINES  Avoid high-purine foods, including all alcohol, yeast extracts taken as supplements, and sauces made from meats (like gravy). Do not eat high-purine meats, including anchovies, sardines, herring, mussels, tuna, codfish, scallops, trout, haddock, bacon, organ meats, tripe, goose, wild game, and sweetbreads.  Grains  Allowed/Recommended: All, except those listed to consume in moderation.   Consume in Moderation: Oatmeal (? cup uncooked daily), wheat bran or germ ( cup daily), and whole grains.  Vegetables  Allowed/Recommended: All, except those listed to consume in moderation.   Consume in Moderation: Asparagus, cauliflower, spinach, mushrooms, and green peas ( cup daily).  Fruit  Allowed/Recommended: All.   Consume in Moderation: None.  Meat and Meat Substitutes  Allowed/Recommended: Eggs, nuts, and peanut butter.   Consume in Moderation: Limit to 4 to 6 oz daily. Avoid high-purine meats. Lentils, peas, and dried beans (1 cup daily).  Milk  Allowed/Recommended: All. Choose low-fat or skim when possible.   Consume in Moderation: None.  Fats and Oils  Allowed/Recommended: All.  Consume in Moderation: None.  Beverages  Allowed/Recommended: All, except those listed to avoid.   Avoid: All alcohol.  Condiments/Miscellaneous  Allowed/Recommended: All, except those listed to consume in moderation.   Consume in Moderation: Bouillon and meat-based broths and soups.  Document Released: 09/20/2010 Document Revised: 05/15/2011 Document Reviewed: 09/20/2010 [facility] Patient Information 2012 [place], [place].

## 2012-09-30 ENCOUNTER — Emergency Department (HOSPITAL_COMMUNITY): Payer: Self-pay

## 2012-09-30 ENCOUNTER — Emergency Department (HOSPITAL_COMMUNITY)
Admission: EM | Admit: 2012-09-30 | Discharge: 2012-10-01 | Disposition: A | Discharge status: Home / Self Care | Payer: Self-pay | Attending: Emergency Medicine | Admitting: Emergency Medicine

## 2012-09-30 ENCOUNTER — Encounter (HOSPITAL_COMMUNITY): Payer: Self-pay | Admitting: *Deleted

## 2012-09-30 DIAGNOSIS — Z862 Personal history of diseases of the blood and blood-forming organs and certain disorders involving the immune mechanism: Secondary | ICD-10-CM | POA: Insufficient documentation

## 2012-09-30 DIAGNOSIS — Z8739 Personal history of other diseases of the musculoskeletal system and connective tissue: Secondary | ICD-10-CM | POA: Insufficient documentation

## 2012-09-30 DIAGNOSIS — E669 Obesity, unspecified: Secondary | ICD-10-CM | POA: Insufficient documentation

## 2012-09-30 DIAGNOSIS — R509 Fever, unspecified: Secondary | ICD-10-CM | POA: Insufficient documentation

## 2012-09-30 DIAGNOSIS — Z79899 Other long term (current) drug therapy: Secondary | ICD-10-CM | POA: Insufficient documentation

## 2012-09-30 DIAGNOSIS — M109 Gout, unspecified: Secondary | ICD-10-CM | POA: Insufficient documentation

## 2012-09-30 DIAGNOSIS — Z8639 Personal history of other endocrine, nutritional and metabolic disease: Secondary | ICD-10-CM | POA: Insufficient documentation

## 2012-09-30 DIAGNOSIS — Z8679 Personal history of other diseases of the circulatory system: Secondary | ICD-10-CM | POA: Insufficient documentation

## 2012-09-30 LAB — POCT I-STAT, CHEM 8
BUN: 9 mg/dL (ref 6–23)
Calcium, Ion: 1.25 mmol/L — ABNORMAL HIGH (ref 1.12–1.23)
Chloride: 107 mEq/L (ref 96–112)
Creatinine, Ser: 1 mg/dL (ref 0.50–1.35)
Glucose, Bld: 115 mg/dL — ABNORMAL HIGH (ref 70–99)
HCT: 37 % — ABNORMAL LOW (ref 39.0–52.0)
Hemoglobin: 12.6 g/dL — ABNORMAL LOW (ref 13.0–17.0)
Potassium: 3.3 mEq/L — ABNORMAL LOW (ref 3.5–5.1)
Sodium: 144 mEq/L (ref 135–145)
TCO2: 22 mmol/L (ref 0–100)

## 2012-09-30 MED ORDER — OXYCODONE-ACETAMINOPHEN 5-325 MG PO TABS
1.0000 | ORAL_TABLET | Freq: Once | ORAL | Status: AC
Start: 2012-09-30 — End: 2012-09-30
  Administered 2012-09-30: 1 via ORAL
  Filled 2012-09-30: qty 1

## 2012-09-30 NOTE — ED Notes (Signed)
Pt states has history of gout; feels like gout flair up in right knee; no known injury
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
## 2012-10-01 MED ORDER — INDOMETHACIN 50 MG PO CAPS: 50.0000 mg | ORAL_CAPSULE | Freq: Three times a day (TID) | ORAL | Status: DC

## 2012-10-01 MED ORDER — OXYCODONE-ACETAMINOPHEN 5-325 MG PO TABS
1.0000 | ORAL_TABLET | Freq: Once | ORAL | Status: AC
  Administered 2012-10-01: 1 via ORAL
  Filled 2012-10-01: qty 1

## 2012-10-01 MED ORDER — PERCOCET 5-325 MG PO TABS: 1.0000 | ORAL_TABLET | Freq: Four times a day (QID) | ORAL | Status: DC | PRN

## 2012-10-04 NOTE — ED Provider Notes (Signed)
Medical screening examination/treatment/procedure(s) were performed by non-physician practitioner and as supervising physician I was immediately available for consultation/collaboration.   Loren Racer, MD 10/04/12 1345

## 2012-10-18 ENCOUNTER — Encounter (HOSPITAL_BASED_OUTPATIENT_CLINIC_OR_DEPARTMENT_OTHER): Payer: Self-pay | Admitting: *Deleted

## 2012-10-18 ENCOUNTER — Emergency Department (HOSPITAL_BASED_OUTPATIENT_CLINIC_OR_DEPARTMENT_OTHER)
Admission: EM | Admit: 2012-10-18 | Discharge: 2012-10-18 | Disposition: A | Discharge status: Home / Self Care | Payer: Self-pay | Attending: Emergency Medicine | Admitting: Emergency Medicine

## 2012-10-18 DIAGNOSIS — Z880 Allergy status to penicillin: Secondary | ICD-10-CM | POA: Insufficient documentation

## 2012-10-18 DIAGNOSIS — Z862 Personal history of diseases of the blood and blood-forming organs and certain disorders involving the immune mechanism: Secondary | ICD-10-CM | POA: Insufficient documentation

## 2012-10-18 DIAGNOSIS — Z8679 Personal history of other diseases of the circulatory system: Secondary | ICD-10-CM | POA: Insufficient documentation

## 2012-10-18 DIAGNOSIS — Z8639 Personal history of other endocrine, nutritional and metabolic disease: Secondary | ICD-10-CM | POA: Insufficient documentation

## 2012-10-18 DIAGNOSIS — J329 Chronic sinusitis, unspecified: Secondary | ICD-10-CM | POA: Insufficient documentation

## 2012-10-18 DIAGNOSIS — J3489 Other specified disorders of nose and nasal sinuses: Secondary | ICD-10-CM | POA: Insufficient documentation

## 2012-10-18 DIAGNOSIS — Z87828 Personal history of other (healed) physical injury and trauma: Secondary | ICD-10-CM | POA: Insufficient documentation

## 2012-10-18 DIAGNOSIS — R197 Diarrhea, unspecified: Secondary | ICD-10-CM | POA: Insufficient documentation

## 2012-10-18 DIAGNOSIS — Z791 Long term (current) use of non-steroidal anti-inflammatories (NSAID): Secondary | ICD-10-CM | POA: Insufficient documentation

## 2012-10-18 DIAGNOSIS — E669 Obesity, unspecified: Secondary | ICD-10-CM | POA: Insufficient documentation

## 2012-10-18 DIAGNOSIS — J00 Acute nasopharyngitis [common cold]: Secondary | ICD-10-CM | POA: Insufficient documentation

## 2012-10-18 DIAGNOSIS — M109 Gout, unspecified: Secondary | ICD-10-CM | POA: Insufficient documentation

## 2012-10-18 MED ORDER — LEVOFLOXACIN 750 MG PO TABS: 750.0000 mg | ORAL_TABLET | Freq: Every day | ORAL | Status: DC

## 2012-10-18 NOTE — ED Notes (Signed)
Facial pain for 2 days. Thought he had a cold a week ago but not his face hurts. Body aching, sore throat and diarrhea.

## 2012-10-18 NOTE — ED Provider Notes (Signed)
Medical screening examination/treatment/procedure(s) were performed by non-physician practitioner and as supervising physician I was immediately available for consultation/collaboration.   Dontez Hauss, MD 10/18/12 2309 
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
## 2012-10-22 ENCOUNTER — Encounter (HOSPITAL_COMMUNITY): Payer: Self-pay | Admitting: Emergency Medicine

## 2012-10-22 ENCOUNTER — Emergency Department (HOSPITAL_COMMUNITY)
Admission: EM | Admit: 2012-10-22 | Discharge: 2012-10-22 | Disposition: A | Discharge status: Home / Self Care | Payer: Self-pay | Attending: Emergency Medicine | Admitting: Emergency Medicine

## 2012-10-22 DIAGNOSIS — F1010 Alcohol abuse, uncomplicated: Secondary | ICD-10-CM | POA: Insufficient documentation

## 2012-10-22 DIAGNOSIS — Z8739 Personal history of other diseases of the musculoskeletal system and connective tissue: Secondary | ICD-10-CM | POA: Insufficient documentation

## 2012-10-22 DIAGNOSIS — E669 Obesity, unspecified: Secondary | ICD-10-CM | POA: Insufficient documentation

## 2012-10-22 DIAGNOSIS — Z8719 Personal history of other diseases of the digestive system: Secondary | ICD-10-CM | POA: Insufficient documentation

## 2012-10-22 DIAGNOSIS — Z862 Personal history of diseases of the blood and blood-forming organs and certain disorders involving the immune mechanism: Secondary | ICD-10-CM | POA: Insufficient documentation

## 2012-10-22 DIAGNOSIS — M109 Gout, unspecified: Secondary | ICD-10-CM

## 2012-10-22 MED ORDER — PREDNISONE 20 MG PO TABS: 40.0000 mg | ORAL_TABLET | Freq: Every day | ORAL | Status: DC

## 2012-10-22 MED ORDER — OXYCODONE-ACETAMINOPHEN 5-325 MG PO TABS: 1.0000 | ORAL_TABLET | Freq: Four times a day (QID) | ORAL | Status: DC | PRN
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
## 2012-10-22 NOTE — ED Notes (Signed)
Pt reports 48 hr hx of r/toe pain and l/knee pain. Hx of gout

## 2012-10-22 NOTE — Progress Notes (Signed)
[facility] has seen patient and given him a list of Primary Care Resources.

## 2012-10-26 NOTE — ED Provider Notes (Signed)
Medical screening examination/treatment/procedure(s) were performed by non-physician practitioner and as supervising physician I was immediately available for consultation/collaboration.   Jarrett Albor M Mardel Grudzien, MD 10/26/12 0713 

## 2013-06-14 ENCOUNTER — Emergency Department (HOSPITAL_COMMUNITY)
Admission: EM | Admit: 2013-06-14 | Discharge: 2013-06-15 | Disposition: A | Discharge status: Home / Self Care | Payer: Self-pay | Attending: Emergency Medicine | Admitting: Emergency Medicine

## 2013-06-14 ENCOUNTER — Encounter (HOSPITAL_COMMUNITY): Payer: Self-pay | Admitting: Emergency Medicine

## 2013-06-14 DIAGNOSIS — E663 Overweight: Secondary | ICD-10-CM | POA: Insufficient documentation

## 2013-06-14 DIAGNOSIS — D649 Anemia, unspecified: Secondary | ICD-10-CM | POA: Insufficient documentation

## 2013-06-14 DIAGNOSIS — Z8679 Personal history of other diseases of the circulatory system: Secondary | ICD-10-CM | POA: Insufficient documentation

## 2013-06-14 DIAGNOSIS — Z8639 Personal history of other endocrine, nutritional and metabolic disease: Secondary | ICD-10-CM | POA: Insufficient documentation

## 2013-06-14 DIAGNOSIS — Z79899 Other long term (current) drug therapy: Secondary | ICD-10-CM | POA: Insufficient documentation

## 2013-06-14 DIAGNOSIS — Z880 Allergy status to penicillin: Secondary | ICD-10-CM | POA: Insufficient documentation

## 2013-06-14 DIAGNOSIS — M109 Gout, unspecified: Secondary | ICD-10-CM | POA: Insufficient documentation

## 2013-06-14 MED ORDER — KETOROLAC TROMETHAMINE 30 MG/ML IJ SOLN
30.0000 mg | Freq: Once | INTRAMUSCULAR | Status: AC
  Administered 2013-06-15: 30 mg via INTRAMUSCULAR
  Filled 2013-06-14: qty 1

## 2013-06-14 NOTE — ED Notes (Signed)
Pt from home c/o gout flare up in right knee with swelling and pain that began yesterday. Out of meds for gout for one month.

## 2013-06-15 LAB — CBC WITH DIFFERENTIAL/PLATELET
Basophils Absolute: 0 10*3/uL (ref 0.0–0.1)
Basophils Relative: 0 % (ref 0–1)
Eosinophils Absolute: 0.2 10*3/uL (ref 0.0–0.7)
Eosinophils Relative: 3 % (ref 0–5)
HCT: 34.2 % — ABNORMAL LOW (ref 39.0–52.0)
Hemoglobin: 9.9 g/dL — ABNORMAL LOW (ref 13.0–17.0)
Lymphocytes Relative: 22 % (ref 12–46)
Lymphs Abs: 1.1 10*3/uL (ref 0.7–4.0)
MCH: 21.2 pg — ABNORMAL LOW (ref 26.0–34.0)
MCHC: 28.9 g/dL — ABNORMAL LOW (ref 30.0–36.0)
MCV: 73.4 fL — ABNORMAL LOW (ref 78.0–100.0)
Monocytes Absolute: 0.5 10*3/uL (ref 0.1–1.0)
Monocytes Relative: 10 % (ref 3–12)
Neutro Abs: 3.4 10*3/uL (ref 1.7–7.7)
Neutrophils Relative %: 65 % (ref 43–77)
Platelets: 192 10*3/uL (ref 150–400)
RBC: 4.66 MIL/uL (ref 4.22–5.81)
RDW: 17.3 % — ABNORMAL HIGH (ref 11.5–15.5)
WBC: 5.2 10*3/uL (ref 4.0–10.5)

## 2013-06-15 MED ORDER — INDOMETHACIN 25 MG PO CAPS: 25.0000 mg | ORAL_CAPSULE | Freq: Three times a day (TID) | ORAL | Status: DC | PRN

## 2013-06-15 MED ORDER — FERROUS SULFATE 325 (65 FE) MG PO TABS: 325.0000 mg | ORAL_TABLET | Freq: Every day | ORAL | Status: DC

## 2013-06-15 MED ORDER — HYDROCODONE-ACETAMINOPHEN 5-325 MG PO TABS: 1.0000 | ORAL_TABLET | Freq: Four times a day (QID) | ORAL | Status: DC | PRN
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
## 2013-06-15 NOTE — Discharge Instructions (Signed)
Anemia, Nonspecific Anemia is a condition in which the concentration of red blood cells or hemoglobin in the blood is below normal. Hemoglobin is a substance in red blood cells that carries oxygen to the tissues of the body. Anemia results in not enough oxygen reaching these tissues.  CAUSES  Common causes of anemia include:   Excessive bleeding. Bleeding may be internal or external. This includes excessive bleeding from periods (in women) or from the intestine.   Poor nutrition.   Chronic kidney, thyroid, and liver disease.  Bone marrow disorders that decrease red blood cell production.  Cancer and treatments for cancer.  HIV, AIDS, and their treatments.  Spleen problems that increase red blood cell destruction.  Blood disorders.  Excess destruction of red blood cells due to infection, medicines, and autoimmune disorders. SIGNS AND SYMPTOMS   Minor weakness.   Dizziness.   Headache.  Palpitations.   Shortness of breath, especially with exercise.   Paleness.  Cold sensitivity.  Indigestion.  Nausea.  Difficulty sleeping.  Difficulty concentrating. Symptoms may occur suddenly or they may develop slowly.  DIAGNOSIS  Additional blood tests are often needed. These help your health care provider determine the best treatment. Your health care provider will check your stool for blood and look for other causes of blood loss.  TREATMENT  Treatment varies depending on the cause of the anemia. Treatment can include:   Supplements of iron, vitamin B12, or folic acid.   Hormone medicines.   A blood transfusion. This may be needed if blood loss is severe.   Hospitalization. This may be needed if there is significant continual blood loss.   Dietary changes.  Spleen removal. HOME CARE INSTRUCTIONS Keep all follow-up appointments. It often takes many weeks to correct anemia, and having your health care provider check on your condition and your response to  treatment is very important. SEEK IMMEDIATE MEDICAL CARE IF:   You develop extreme weakness, shortness of breath, or chest pain.   You become dizzy or have trouble concentrating.  You develop heavy vaginal bleeding.   You develop a rash.   You have bloody or black, tarry stools.   You faint.   You vomit up blood.   You vomit repeatedly.   You have abdominal pain.  You have a fever or persistent symptoms for more than 2 3 days.   You have a fever and your symptoms suddenly get worse.   You are dehydrated.  MAKE SURE YOU:  Understand these instructions.  Will watch your condition.  Will get help right away if you are not doing well or get worse. Document Released: 07/03/2004 Document Revised: 01/26/2013 Document Reviewed: 11/19/2012 [facility] Patient Information 2014 [place], [place]. Gout Gout is an inflammatory arthritis caused by a buildup of uric acid crystals in the joints. Uric acid is a chemical that is normally present in the blood. When the level of uric acid in the blood is too high it can form crystals that deposit in your joints and tissues. This causes joint redness, soreness, and swelling (inflammation). Repeat attacks are common. Over time, uric acid crystals can form into masses (tophi) near a joint, destroying bone and causing disfigurement. Gout is treatable and often preventable. CAUSES  The disease begins with elevated levels of uric acid in the blood. Uric acid is produced by your body when it breaks down a naturally found substance called purines. Certain foods you eat, such as meats and fish, contain high amounts of purines. Causes of an elevated  uric acid level include:  Being passed down from parent to child (heredity).  Diseases that cause increased uric acid production (such as obesity, psoriasis, and certain cancers).  Excessive alcohol use.  Diet, especially diets rich in meat and seafood.  Medicines, including certain cancer-fighting  medicines (chemotherapy), water pills (diuretics), and aspirin.  Chronic kidney disease. The kidneys are no longer able to remove uric acid well.  Problems with metabolism. Conditions strongly associated with gout include:  Obesity.  High blood pressure.  High cholesterol.  Diabetes. Not everyone with elevated uric acid levels gets gout. It is not understood why some people get gout and others do not. Surgery, joint injury, and eating too much of certain foods are some of the factors that can lead to gout attacks. SYMPTOMS   An attack of gout comes on quickly. It causes intense pain with redness, swelling, and warmth in a joint.  Fever can occur.  Often, only one joint is involved. Certain joints are more commonly involved:  Base of the big toe.  Knee.  Ankle.  Wrist.  Finger. Without treatment, an attack usually goes away in a few days to weeks. Between attacks, you usually will not have symptoms, which is different from many other forms of arthritis. DIAGNOSIS  Your caregiver will suspect gout based on your symptoms and exam. In some cases, tests may be recommended. The tests may include:  Blood tests.  Urine tests.  X-rays.  Joint fluid exam. This exam requires a needle to remove fluid from the joint (arthrocentesis). Using a microscope, gout is confirmed when uric acid crystals are seen in the joint fluid. TREATMENT  There are two phases to gout treatment: treating the sudden onset (acute) attack and preventing attacks (prophylaxis).  Treatment of an Acute Attack.  Medicines are used. These include anti-inflammatory medicines or steroid medicines.  An injection of steroid medicine into the affected joint is sometimes necessary.  The painful joint is rested. Movement can worsen the arthritis.  You may use warm or cold treatments on painful joints, depending which works best for you.  Treatment to Prevent Attacks.  If you suffer from frequent gout attacks,  your caregiver may advise preventive medicine. These medicines are started after the acute attack subsides. These medicines either help your kidneys eliminate uric acid from your body or decrease your uric acid production. You may need to stay on these medicines for a very long time.  The early phase of treatment with preventive medicine can be associated with an increase in acute gout attacks. For this reason, during the first few months of treatment, your caregiver may also advise you to take medicines usually used for acute gout treatment. Be sure you understand your caregiver's directions. Your caregiver may make several adjustments to your medicine dose before these medicines are effective.  Discuss dietary treatment with your caregiver or dietitian. Alcohol and drinks high in sugar and fructose and foods such as meat, poultry, and seafood can increase uric acid levels. Your caregiver or dietician can advise you on drinks and foods that should be limited. HOME CARE INSTRUCTIONS   Do not take aspirin to relieve pain. This raises uric acid levels.  Only take over-the-counter or prescription medicines for pain, discomfort, or fever as directed by your caregiver.  Rest the joint as much as possible. When in bed, keep sheets and blankets off painful areas.  Keep the affected joint raised (elevated).  Apply warm or cold treatments to painful joints. Use of warm or  cold treatments depends on which works best for you.  Use crutches if the painful joint is in your leg.  Drink enough fluids to keep your urine clear or pale yellow. This helps your body get rid of uric acid. Limit alcohol, sugary drinks, and fructose drinks.  Follow your dietary instructions. Pay careful attention to the amount of protein you eat. Your daily diet should emphasize fruits, vegetables, whole grains, and fat-free or low-fat milk products. Discuss the use of coffee, vitamin C, and cherries with your caregiver or dietician.  These may be helpful in lowering uric acid levels.  Maintain a healthy body weight. SEEK MEDICAL CARE IF:   You develop diarrhea, vomiting, or any side effects from medicines.  You do not feel better in 24 hours, or you are getting worse. SEEK IMMEDIATE MEDICAL CARE IF:   Your joint becomes suddenly more tender, and you have chills or a fever. MAKE SURE YOU:   Understand these instructions.  Will watch your condition.  Will get help right away if you are not doing well or get worse. Document Released: 05/23/2000 Document Revised: 09/20/2012 Document Reviewed: 01/07/2012 [facility] Patient Information 2014 [place], [place].

## 2013-07-18 ENCOUNTER — Encounter (HOSPITAL_BASED_OUTPATIENT_CLINIC_OR_DEPARTMENT_OTHER): Payer: Self-pay | Admitting: Emergency Medicine

## 2013-07-18 ENCOUNTER — Emergency Department (HOSPITAL_BASED_OUTPATIENT_CLINIC_OR_DEPARTMENT_OTHER)
Admission: EM | Admit: 2013-07-18 | Discharge: 2013-07-18 | Disposition: A | Discharge status: Home / Self Care | Payer: Self-pay | Attending: Emergency Medicine | Admitting: Emergency Medicine

## 2013-07-18 DIAGNOSIS — S86019A Strain of unspecified Achilles tendon, initial encounter: Secondary | ICD-10-CM

## 2013-07-18 DIAGNOSIS — S93499A Sprain of other ligament of unspecified ankle, initial encounter: Secondary | ICD-10-CM | POA: Insufficient documentation

## 2013-07-18 DIAGNOSIS — Z9889 Other specified postprocedural states: Secondary | ICD-10-CM | POA: Insufficient documentation

## 2013-07-18 DIAGNOSIS — S96819A Strain of other specified muscles and tendons at ankle and foot level, unspecified foot, initial encounter: Principal | ICD-10-CM

## 2013-07-18 DIAGNOSIS — Y9239 Other specified sports and athletic area as the place of occurrence of the external cause: Secondary | ICD-10-CM | POA: Insufficient documentation

## 2013-07-18 DIAGNOSIS — Y9367 Activity, basketball: Secondary | ICD-10-CM | POA: Insufficient documentation

## 2013-07-18 DIAGNOSIS — D509 Iron deficiency anemia, unspecified: Secondary | ICD-10-CM | POA: Insufficient documentation

## 2013-07-18 DIAGNOSIS — Z79899 Other long term (current) drug therapy: Secondary | ICD-10-CM | POA: Insufficient documentation

## 2013-07-18 DIAGNOSIS — M109 Gout, unspecified: Secondary | ICD-10-CM | POA: Insufficient documentation

## 2013-07-18 DIAGNOSIS — R209 Unspecified disturbances of skin sensation: Secondary | ICD-10-CM | POA: Insufficient documentation

## 2013-07-18 DIAGNOSIS — Z880 Allergy status to penicillin: Secondary | ICD-10-CM | POA: Insufficient documentation

## 2013-07-18 DIAGNOSIS — E669 Obesity, unspecified: Secondary | ICD-10-CM | POA: Insufficient documentation

## 2013-07-18 DIAGNOSIS — R296 Repeated falls: Secondary | ICD-10-CM | POA: Insufficient documentation

## 2013-07-18 DIAGNOSIS — Y92838 Other recreation area as the place of occurrence of the external cause: Secondary | ICD-10-CM

## 2013-07-18 DIAGNOSIS — Z8679 Personal history of other diseases of the circulatory system: Secondary | ICD-10-CM | POA: Insufficient documentation

## 2013-07-18 MED ORDER — HYDROCODONE-ACETAMINOPHEN 5-325 MG PO TABS: 1.0000 | ORAL_TABLET | ORAL | Status: DC | PRN

## 2013-07-18 MED ORDER — IBUPROFEN 800 MG PO TABS: 800.0000 mg | ORAL_TABLET | Freq: Three times a day (TID) | ORAL | Status: DC
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
## 2013-07-18 NOTE — ED Notes (Signed)
Pt reports right leg pain after pushing off to run while playing basketball. Unable to bear full weight on right leg. Ambulates with limp

## 2013-07-18 NOTE — Discharge Instructions (Signed)
REMAIN NONWEIGHT BEARING ON THE RIGHT LEG, USING CRUTCHES TO SUPPORT YOUR WEIGHT. YOU WILL NEED TO SEE THE ORTHOPEDIST FOR FURTHER EVALUATION AND TREATMENT OF ACHILLES RUPTURE.   Cryotherapy Cryotherapy means treatment with cold. Ice or gel packs can be used to reduce both pain and swelling. Ice is the most helpful within the first 24 to 48 hours after an injury or flareup from overusing a muscle or joint. Sprains, strains, spasms, burning pain, shooting pain, and aches can all be eased with ice. Ice can also be used when recovering from surgery. Ice is effective, has very few side effects, and is safe for most people to use. PRECAUTIONS  Ice is not a safe treatment option for people with:  Raynaud's phenomenon. This is a condition affecting small blood vessels in the extremities. Exposure to cold may cause your problems to return.  Cold hypersensitivity. There are many forms of cold hypersensitivity, including:  Cold urticaria. Red, itchy hives appear on the skin when the tissues begin to warm after being iced.  Cold erythema. This is a red, itchy rash caused by exposure to cold.  Cold hemoglobinuria. Red blood cells break down when the tissues begin to warm after being iced. The hemoglobin that carry oxygen are passed into the urine because they cannot combine with blood proteins fast enough.  Numbness or altered sensitivity in the area being iced. If you have any of the following conditions, do not use ice until you have discussed cryotherapy with your caregiver:  Heart conditions, such as arrhythmia, angina, or chronic heart disease.  High blood pressure.  Healing wounds or open skin in the area being iced.  Current infections.  Rheumatoid arthritis.  Poor circulation.  Diabetes. Ice slows the blood flow in the region it is applied. This is beneficial when trying to stop inflamed tissues from spreading irritating chemicals to surrounding tissues. However, if you expose your skin  to cold temperatures for too long or without the proper protection, you can damage your skin or nerves. Watch for signs of skin damage due to cold. HOME CARE INSTRUCTIONS Follow these tips to use ice and cold packs safely.  Place a dry or damp towel between the ice and skin. A damp towel will cool the skin more quickly, so you may need to shorten the time that the ice is used.  For a more rapid response, add gentle compression to the ice.  Ice for no more than 10 to 20 minutes at a time. The bonier the area you are icing, the less time it will take to get the benefits of ice.  Check your skin after 5 minutes to make sure there are no signs of a poor response to cold or skin damage.  Rest 20 minutes or more in between uses.  Once your skin is numb, you can end your treatment. You can test numbness by very lightly touching your skin. The touch should be so light that you do not see the skin dimple from the pressure of your fingertip. When using ice, most people will feel these normal sensations in this order: cold, burning, aching, and numbness.  Do not use ice on someone who cannot communicate their responses to pain, such as small children or people with dementia. HOW TO MAKE AN ICE PACK Ice packs are the most common way to use ice therapy. Other methods include ice massage, ice baths, and cryo-sprays. Muscle creams that cause a cold, tingly feeling do not offer the same benefits that  ice offers and should not be used as a substitute unless recommended by your caregiver. To make an ice pack, do one of the following:  Place crushed ice or a bag of frozen vegetables in a sealable plastic bag. Squeeze out the excess air. Place this bag inside another plastic bag. Slide the bag into a pillowcase or place a damp towel between your skin and the bag.  Mix 3 parts water with 1 part rubbing alcohol. Freeze the mixture in a sealable plastic bag. When you remove the mixture from the freezer, it will be  slushy. Squeeze out the excess air. Place this bag inside another plastic bag. Slide the bag into a pillowcase or place a damp towel between your skin and the bag. SEEK MEDICAL CARE IF:  You develop white spots on your skin. This may give the skin a blotchy (mottled) appearance.  Your skin turns blue or pale.  Your skin becomes waxy or hard.  Your swelling gets worse. MAKE SURE YOU:   Understand these instructions.  Will watch your condition.  Will get help right away if you are not doing well or get worse. Document Released: 01/20/2011 Document Revised: 08/18/2011 Document Reviewed: 01/20/2011 [facility] Patient Information 2014 [place], [place]. Complete Achilles Tendon Rupture An Achilles tendon rupture is an injury in which the tough, cord-like band that attaches the lower muscles of your leg to your heel (Achilles tendon) tears (ruptures). In a complete Achilles tendon rupture, you are not able to stand up on the toes of the injured leg. CAUSES A tendon may rupture if it is weakened or weakening (degenerative) and a sudden stress is applied to it. Weakening or degeneration of a tendon may be caused by:  Recurrent injuries, such as those causing Achilles tendinitis.  Damaged tendons.  Aging.  Vascular disease of the tendon. SIGNS AND SYMPTOMS  Feeling as if you were struck violently in the back of the ankle.  Hearing a "pop" and experiencing severe, sudden (acute) pain (however, the absence of pain does not mean there was not a rupture). DIAGNOSIS A physical exam is usually all that is needed to diagnose an Achilles tendon rupture. During the exam, your health care provider will touch the tendon and the structures around it. You may be asked to lie on your stomach or kneel on a chair while your health care provider squeezes your calf muscle. You most likely have a ruptured tendon if your foot does not flex.  Sometimes tests are performed. These may include:  An ultrasound.  This allows quick confirmation of the diagnosis.  An X-ray.  An MRI. TREATMENT  A complete Achilles tendon rupture is treated with surgery. You will have a cast while the injury heals (this usually takes 6 10 weeks). Before your surgery, treatment may consist of:  Ice applied to the area.  Pain-relieving medicines.  Rest.  Crutches.  Keeping the ankle from moving (immobilization ), usually with a splint. HOME CARE INSTRUCTIONS   Apply ice to the injured area:   Put ice in a plastic bag.   Place a towel between your skin and the bag.   Leave the ice on for 20 minutes, 2 3 times a day.   Use crutches and move about only as instructed.   Keep the leg elevated above the level of the heart (the center of the chest) at all times when not using the bathroom. Do not dangle the leg over a chair, couch, or bed. When lying down, elevate your  leg on a few pillows. Elevation prevents swelling and reduces pain.   Avoid use of the injured area other than gentle range of motion of the toes.   Do not drive a car until your health care provider specifically tells you it is safe to do so.   Only take over-the-counter or prescription medicines for pain, discomfort, or fever as directed by your health care provider.  Keep all follow-up appointments with your health care provider. SEEK MEDICAL CARE IF:   Your pain and swelling increase, or your pain is not controlled by medicines.   You have new, unexplained symptoms, or your symptoms get worse.   You cannot move your toes or foot.  You develop warmth and swelling in your foot.  You have an unexplained fever. MAKE SURE YOU:   Understand these instructions.  Will watch your condition.  Will get help right away if you are not doing well or get worse. Document Released: 03/05/2005 Document Revised: 03/16/2013 Document Reviewed: 01/14/2013 [facility] Patient Information 2014 [place], [place].

## 2013-07-21 ENCOUNTER — Encounter (HOSPITAL_COMMUNITY): Payer: Self-pay | Admitting: Pharmacy Technician

## 2013-07-22 ENCOUNTER — Other Ambulatory Visit (HOSPITAL_COMMUNITY): Payer: Self-pay | Admitting: Orthopaedic Surgery

## 2013-07-22 ENCOUNTER — Other Ambulatory Visit (HOSPITAL_COMMUNITY): Payer: Self-pay

## 2013-07-23 NOTE — ED Provider Notes (Signed)
Medical screening examination/treatment/procedure(s) were performed by non-physician practitioner and as supervising physician I was immediately available for consultation/collaboration.  EKG Interpretation   None         Rolland PorterMark Terri Rorrer, MD 07/23/13 [phone]

## 2013-07-26 ENCOUNTER — Encounter (HOSPITAL_COMMUNITY): Payer: Self-pay

## 2013-07-26 ENCOUNTER — Encounter (HOSPITAL_COMMUNITY)
Admission: RE | Admit: 2013-07-26 | Discharge: 2013-07-26 | Disposition: A | Discharge status: Home / Self Care | Payer: Self-pay | Source: Ambulatory Visit | Attending: Orthopaedic Surgery | Admitting: Orthopaedic Surgery

## 2013-07-26 DIAGNOSIS — Z01812 Encounter for preprocedural laboratory examination: Secondary | ICD-10-CM | POA: Insufficient documentation

## 2013-07-26 HISTORY — DX: Anesthesia of skin: R20.0

## 2013-07-26 LAB — SURGICAL PCR SCREEN
MRSA, PCR: NEGATIVE
Staphylococcus aureus: NEGATIVE

## 2013-07-26 LAB — BASIC METABOLIC PANEL
BUN: 9 mg/dL (ref 6–23)
CO2: 23 mEq/L (ref 19–32)
Calcium: 9.5 mg/dL (ref 8.4–10.5)
Chloride: 106 mEq/L (ref 96–112)
Creatinine, Ser: 0.95 mg/dL (ref 0.50–1.35)
GFR calc Af Amer: >90 mL/min (ref >90)
GFR calc non Af Amer: >90 mL/min (ref >90)
Glucose, Bld: 103 mg/dL — ABNORMAL HIGH (ref 70–99)
Potassium: 4.5 mEq/L (ref 3.7–5.3)
Sodium: 141 mEq/L (ref 137–147)

## 2013-07-26 LAB — CBC
HCT: 39.8 % (ref 39.0–52.0)
Hemoglobin: 12.4 g/dL — ABNORMAL LOW (ref 13.0–17.0)
MCH: 25.3 pg — ABNORMAL LOW (ref 26.0–34.0)
MCHC: 31.2 g/dL (ref 30.0–36.0)
MCV: 81.1 fL (ref 78.0–100.0)
Platelets: 189 10*3/uL (ref 150–400)
RBC: 4.91 MIL/uL (ref 4.22–5.81)
RDW: 20.3 % — ABNORMAL HIGH (ref 11.5–15.5)
WBC: 2.6 10*3/uL — ABNORMAL LOW (ref 4.0–10.5)

## 2013-07-26 NOTE — Patient Instructions (Addendum)
   [facility]  Wednesday  2[facility]  REPORT TO  SHORT STAY CENTER AT:  11:30 AM      PHONE # FOR SHORT STAY IS [phone]  DO NOT EAT ANYTHING AFTER MIDNIGHT TONIGHT.  YOU MAY BRUSH YOUR TEETH.   NO FOOD, NO CHEWING GUM, NO MINTS, NO CANDIES, NO CHEWING TOBACCO.  YOU MAY HAVE CLEAR LIQUIDS TO DRINK FROM MIDNIGHT TONIGHT - UNTIL 7:30 AM DAY OF SURGERY - LIKE WATER.  NOTHING TO DRINK AFTER 7:30 AM TOMORROW.  PLEASE TAKE THE FOLLOWING MEDICATIONS THE AM OF YOUR SURGERY WITH A FEW SIPS OF WATER:  PERCOCET   DO NOT BRING VALUABLES, MONEY, CREDIT CARDS.  DO NOT WEAR JEWELRY, MAKE-UP, NAIL POLISH AND NO METAL PINS OR CLIPS IN YOUR HAIR. CONTACT LENS, DENTURES / PARTIALS, GLASSES SHOULD NOT BE WORN TO SURGERY AND IN MOST CASES-HEARING AIDS WILL NEED TO BE REMOVED.  BRING YOUR GLASSES CASE, ANY EQUIPMENT NEEDED FOR YOUR CONTACT LENS. FOR PATIENTS ADMITTED TO THE HOSPITAL--CHECK OUT TIME THE DAY OF DISCHARGE IS 11:00 AM.  ALL INPATIENT ROOMS ARE PRIVATE - WITH BATHROOM, TELEPHONE, TELEVISION AND WIFI INTERNET.  IF YOU ARE BEING DISCHARGED THE SAME DAY OF YOUR SURGERY--YOU CAN NOT DRIVE YOURSELF HOME--AND SHOULD NOT GO HOME ALONE BY TAXI OR BUS.  NO DRIVING OR OPERATING MACHINERY, OR MAKING LEGAL DECISIONS FOR 24 HOURS FOLLOWING ANESTHESIA / PAIN MEDICATIONS.  PLEASE MAKE ARRANGEMENTS FOR SOMEONE TO BE WITH YOU AT HOME THE FIRST 24 HOURS AFTER SURGERY. RESPONSIBLE DRIVER'S NAME / PHONE    GIRL FRIEND  MARIE HIGHTOWER  [phone]                                                   PLEASE READ OVER ANY  FACT SHEETS THAT YOU WERE GIVEN: MRSA INFORMATION  FAILURE TO FOLLOW THESE INSTRUCTIONS MAY RESULT IN THE CANCELLATION OF YOUR SURGERY. PLEASE BE AWARE THAT YOU MAY NEED ADDITIONAL BLOOD DRAWN DAY OF YOUR SURGERY  PATIENT SIGNATURE_________________________________

## 2013-07-26 NOTE — Pre-Procedure Instructions (Signed)
CXR AND EKG NOT NEEDED PER ANESTHESIOLOGIST'S GUIDELINES. PT'S PREOP CBC REPORT FAXED TO DR. Warren DanesXU'S OFFICE AND SHERRY - HIS SURGERY SCHEDULER NOTIFIED PT'S WBC'S LOW 2.600.

## 2013-07-26 NOTE — Pre-Procedure Instructions (Signed)
EKG AND CXR NOT NEEDED PREOP - PER ANESTHESIOLOGIST'S GUIDELINES. 

## 2013-07-27 ENCOUNTER — Encounter (HOSPITAL_COMMUNITY): Admission: RE | Payer: Self-pay | Source: Ambulatory Visit

## 2013-07-27 ENCOUNTER — Ambulatory Visit (HOSPITAL_COMMUNITY): Admission: RE | Admit: 2013-07-27 | Payer: Self-pay | Source: Ambulatory Visit | Admitting: Orthopaedic Surgery

## 2013-07-27 SURGERY — REPAIR, TENDON, ACHILLES
Anesthesia: General | Laterality: Right

## 2013-07-29 ENCOUNTER — Telehealth: Payer: Self-pay | Admitting: Hematology & Oncology

## 2013-07-29 ENCOUNTER — Other Ambulatory Visit (HOSPITAL_BASED_OUTPATIENT_CLINIC_OR_DEPARTMENT_OTHER): Payer: Self-pay | Admitting: Lab

## 2013-07-29 ENCOUNTER — Encounter: Payer: Self-pay | Admitting: Hematology & Oncology

## 2013-07-29 ENCOUNTER — Ambulatory Visit (HOSPITAL_BASED_OUTPATIENT_CLINIC_OR_DEPARTMENT_OTHER): Payer: Self-pay | Admitting: Hematology & Oncology

## 2013-07-29 ENCOUNTER — Ambulatory Visit: Payer: Self-pay

## 2013-07-29 VITALS — BP 133/75 | HR 72 | Temp 97.9°F | Resp 18 | Ht 70.0 in | Wt 259.0 lb

## 2013-07-29 DIAGNOSIS — D72819 Decreased white blood cell count, unspecified: Secondary | ICD-10-CM

## 2013-07-29 DIAGNOSIS — D509 Iron deficiency anemia, unspecified: Secondary | ICD-10-CM

## 2013-07-29 DIAGNOSIS — E538 Deficiency of other specified B group vitamins: Secondary | ICD-10-CM

## 2013-07-29 DIAGNOSIS — D649 Anemia, unspecified: Secondary | ICD-10-CM

## 2013-07-29 DIAGNOSIS — F1021 Alcohol dependence, in remission: Secondary | ICD-10-CM

## 2013-07-29 LAB — CBC WITH DIFFERENTIAL (CANCER CENTER ONLY)
BASO#: 0 10*3/uL (ref 0.0–0.2)
BASO%: 0.3 % (ref 0.0–2.0)
EOS%: 4.9 % (ref 0.0–7.0)
Eosinophils Absolute: 0.2 10*3/uL (ref 0.0–0.5)
HCT: 37.2 % — ABNORMAL LOW (ref 38.7–49.9)
HGB: 11.5 g/dL — ABNORMAL LOW (ref 13.0–17.1)
LYMPH#: 1 10*3/uL (ref 0.9–3.3)
LYMPH%: 26.8 % (ref 14.0–48.0)
MCH: 25.2 pg — ABNORMAL LOW (ref 28.0–33.4)
MCHC: 30.9 g/dL — ABNORMAL LOW (ref 32.0–35.9)
MCV: 81 fL — ABNORMAL LOW (ref 82–98)
MONO#: 0.3 10*3/uL (ref 0.1–0.9)
MONO%: 7.7 % (ref 0.0–13.0)
NEUT#: 2.3 10*3/uL (ref 1.5–6.5)
NEUT%: 60.3 % (ref 40.0–80.0)
Platelets: 190 10*3/uL (ref 145–400)
RBC: 4.57 10*6/uL (ref 4.20–5.70)
RDW: 20.9 % — ABNORMAL HIGH (ref 11.1–15.7)
WBC: 3.9 10*3/uL — ABNORMAL LOW (ref 4.0–10.0)

## 2013-07-29 LAB — CHCC SATELLITE - SMEAR
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
## 2013-07-29 NOTE — Telephone Encounter (Signed)
Pt has no insurance and has applied for Medicaid and the [facility] CAFA program.  CAFA [facility] to: [facility] Medicaid application forwarded to:  [address] [place], [place] [id number]

## 2013-08-01 ENCOUNTER — Telehealth: Payer: Self-pay | Admitting: Hematology & Oncology

## 2013-08-01 NOTE — Telephone Encounter (Signed)
Mailed April schedule °

## 2013-08-02 LAB — HEMOGLOBINOPATHY EVALUATION
Hemoglobin Other: 0 %
Hgb A2 Quant: 2.6 % (ref 2.2–3.2)
Hgb A: 97.4 % (ref 96.8–97.8)
Hgb F Quant: 0 % (ref 0.0–2.0)
Hgb S Quant: 0 %

## 2013-08-02 LAB — HEPATITIS PANEL, ACUTE
HCV Ab: NEGATIVE
Hep A IgM: NONREACTIVE
Hep B C IgM: NONREACTIVE
Hepatitis B Surface Ag: NEGATIVE

## 2013-08-02 LAB — HIV ANTIBODY (ROUTINE TESTING W REFLEX): HIV: NONREACTIVE

## 2013-08-02 LAB — VITAMIN B12: Vitamin B-12: 310 pg/mL (ref 211–911)

## 2013-08-03 ENCOUNTER — Encounter (HOSPITAL_COMMUNITY): Payer: Self-pay | Admitting: Pharmacy Technician

## 2013-08-04 ENCOUNTER — Encounter (HOSPITAL_COMMUNITY): Payer: Self-pay | Admitting: *Deleted

## 2013-08-04 MED ORDER — CLINDAMYCIN PHOSPHATE 900 MG/50ML IV SOLN
900.0000 mg | INTRAVENOUS | Status: AC
  Administered 2013-08-05: 900 mg via INTRAVENOUS
  Filled 2013-08-04: qty 50

## 2013-08-04 NOTE — Progress Notes (Signed)
Time change noted to 1042 pt called and informed to be here at 0800, pt voices understanding.

## 2013-08-05 ENCOUNTER — Observation Stay (HOSPITAL_COMMUNITY)
Admission: RE | Admit: 2013-08-05 | Discharge: 2013-08-06 | Disposition: A | Discharge status: Home / Self Care | Payer: Self-pay | Source: Ambulatory Visit | Attending: Orthopaedic Surgery | Admitting: Orthopaedic Surgery

## 2013-08-05 ENCOUNTER — Encounter (HOSPITAL_COMMUNITY): Payer: Self-pay | Admitting: Anesthesiology

## 2013-08-05 ENCOUNTER — Encounter (HOSPITAL_COMMUNITY): Payer: MEDICAID | Admitting: Anesthesiology

## 2013-08-05 ENCOUNTER — Encounter (HOSPITAL_COMMUNITY)
Admission: RE | Disposition: A | Discharge status: Home / Self Care | Payer: Self-pay | Source: Ambulatory Visit | Attending: Orthopaedic Surgery

## 2013-08-05 ENCOUNTER — Ambulatory Visit (HOSPITAL_COMMUNITY): Payer: Self-pay | Admitting: Anesthesiology

## 2013-08-05 DIAGNOSIS — S86011A Strain of right Achilles tendon, initial encounter: Secondary | ICD-10-CM

## 2013-08-05 DIAGNOSIS — M109 Gout, unspecified: Secondary | ICD-10-CM | POA: Insufficient documentation

## 2013-08-05 DIAGNOSIS — M66369 Spontaneous rupture of flexor tendons, unspecified lower leg: Principal | ICD-10-CM | POA: Insufficient documentation

## 2013-08-05 DIAGNOSIS — D509 Iron deficiency anemia, unspecified: Secondary | ICD-10-CM | POA: Insufficient documentation

## 2013-08-05 DIAGNOSIS — E669 Obesity, unspecified: Secondary | ICD-10-CM | POA: Insufficient documentation

## 2013-08-05 DIAGNOSIS — E538 Deficiency of other specified B group vitamins: Secondary | ICD-10-CM | POA: Insufficient documentation

## 2013-08-05 DIAGNOSIS — R209 Unspecified disturbances of skin sensation: Secondary | ICD-10-CM | POA: Insufficient documentation

## 2013-08-05 DIAGNOSIS — K649 Unspecified hemorrhoids: Secondary | ICD-10-CM | POA: Insufficient documentation

## 2013-08-05 HISTORY — PX: ACHILLES TENDON SURGERY: SHX542

## 2013-08-05 HISTORY — DX: Strain of right Achilles tendon, initial encounter: S86.011A

## 2013-08-05 LAB — CBC
HCT: 36.9 % — ABNORMAL LOW (ref 39.0–52.0)
Hemoglobin: 11.8 g/dL — ABNORMAL LOW (ref 13.0–17.0)
MCH: 26.5 pg (ref 26.0–34.0)
MCHC: 32 g/dL (ref 30.0–36.0)
MCV: 82.7 fL (ref 78.0–100.0)
Platelets: 183 10*3/uL (ref 150–400)
RBC: 4.46 MIL/uL (ref 4.22–5.81)
RDW: 19.8 % — ABNORMAL HIGH (ref 11.5–15.5)
WBC: 4.1 10*3/uL (ref 4.0–10.5)

## 2013-08-05 LAB — BASIC METABOLIC PANEL
BUN: 9 mg/dL (ref 6–23)
CO2: 24 mEq/L (ref 19–32)
Calcium: 9.2 mg/dL (ref 8.4–10.5)
Chloride: 103 mEq/L (ref 96–112)
Creatinine, Ser: 1.02 mg/dL (ref 0.50–1.35)
GFR calc Af Amer: >90 mL/min (ref >90)
GFR calc non Af Amer: >90 mL/min (ref >90)
Glucose, Bld: 90 mg/dL (ref 70–99)
Potassium: 4 mEq/L (ref 3.7–5.3)
Sodium: 138 mEq/L (ref 137–147)

## 2013-08-05 SURGERY — REPAIR, TENDON, ACHILLES
Anesthesia: General | Site: Ankle | Laterality: Right

## 2013-08-05 MED ORDER — FENTANYL CITRATE 0.05 MG/ML IJ SOLN: 50.0000 ug | Freq: Once | INTRAMUSCULAR | Status: DC

## 2013-08-05 MED ORDER — HYDROMORPHONE HCL PF 1 MG/ML IJ SOLN
INTRAMUSCULAR | Status: AC
  Administered 2013-08-05: 0.5 mg via INTRAVENOUS
  Filled 2013-08-05: qty 1

## 2013-08-05 MED ORDER — PROPOFOL 10 MG/ML IV BOLUS
INTRAVENOUS | Status: AC
  Filled 2013-08-05: qty 20

## 2013-08-05 MED ORDER — DIAZEPAM 5 MG/ML IJ SOLN
INTRAMUSCULAR | Status: AC
  Filled 2013-08-05: qty 2

## 2013-08-05 MED ORDER — NEOSTIGMINE METHYLSULFATE 1 MG/ML IJ SOLN
INTRAMUSCULAR | Status: DC | PRN
  Administered 2013-08-05: 4 mg via INTRAVENOUS

## 2013-08-05 MED ORDER — LIDOCAINE HCL (CARDIAC) 20 MG/ML IV SOLN
INTRAVENOUS | Status: AC
  Filled 2013-08-05: qty 5

## 2013-08-05 MED ORDER — PROMETHAZINE HCL 25 MG/ML IJ SOLN: 6.2500 mg | INTRAMUSCULAR | Status: DC | PRN

## 2013-08-05 MED ORDER — MIDAZOLAM HCL 5 MG/5ML IJ SOLN
INTRAMUSCULAR | Status: DC | PRN
  Administered 2013-08-05: 2 mg via INTRAVENOUS

## 2013-08-05 MED ORDER — ONDANSETRON HCL 4 MG PO TABS
4.0000 mg | ORAL_TABLET | Freq: Four times a day (QID) | ORAL | Status: DC | PRN
Start: 2013-08-05 — End: 2013-08-06

## 2013-08-05 MED ORDER — CLINDAMYCIN PHOSPHATE 600 MG/50ML IV SOLN
600.0000 mg | Freq: Four times a day (QID) | INTRAVENOUS | Status: AC
  Administered 2013-08-05 – 2013-08-06 (×2): 600 mg via INTRAVENOUS
  Filled 2013-08-05 (×3): qty 50

## 2013-08-05 MED ORDER — LIDOCAINE HCL (CARDIAC) 20 MG/ML IV SOLN
INTRAVENOUS | Status: DC | PRN
  Administered 2013-08-05: 100 mg via INTRAVENOUS

## 2013-08-05 MED ORDER — METOCLOPRAMIDE HCL 5 MG/ML IJ SOLN: 5.0000 mg | Freq: Three times a day (TID) | INTRAMUSCULAR | Status: DC | PRN

## 2013-08-05 MED ORDER — SODIUM CHLORIDE 0.9 % IV SOLN
INTRAVENOUS | Status: DC
  Administered 2013-08-05: 21:00:00 via INTRAVENOUS

## 2013-08-05 MED ORDER — MAGNESIUM CITRATE PO SOLN: 1.0000 | Freq: Once | ORAL | Status: AC | PRN

## 2013-08-05 MED ORDER — DIAZEPAM 5 MG/ML IJ SOLN
5.0000 mg | Freq: Once | INTRAMUSCULAR | Status: AC
  Administered 2013-08-05: 5 mg via INTRAVENOUS

## 2013-08-05 MED ORDER — PROPOFOL 10 MG/ML IV BOLUS
INTRAVENOUS | Status: DC | PRN
  Administered 2013-08-05: 200 mg via INTRAVENOUS

## 2013-08-05 MED ORDER — SENNA 8.6 MG PO TABS
1.0000 | ORAL_TABLET | Freq: Two times a day (BID) | ORAL | Status: DC
  Administered 2013-08-05 – 2013-08-06 (×2): 8.6 mg via ORAL
  Filled 2013-08-05 (×3): qty 1

## 2013-08-05 MED ORDER — BUPIVACAINE HCL (PF) 0.25 % IJ SOLN
INTRAMUSCULAR | Status: DC | PRN
  Administered 2013-08-05: 10 mL

## 2013-08-05 MED ORDER — MIDAZOLAM HCL 2 MG/2ML IJ SOLN
INTRAMUSCULAR | Status: AC
  Filled 2013-08-05: qty 2

## 2013-08-05 MED ORDER — MIDAZOLAM HCL 2 MG/2ML IJ SOLN: 1.0000 mg | INTRAMUSCULAR | Status: DC | PRN

## 2013-08-05 MED ORDER — TIZANIDINE HCL 4 MG PO CAPS: 4.0000 mg | ORAL_CAPSULE | Freq: Three times a day (TID) | ORAL | Status: DC | PRN

## 2013-08-05 MED ORDER — OXYCODONE-ACETAMINOPHEN 5-325 MG PO TABS: 1.0000 | ORAL_TABLET | ORAL | Status: DC | PRN

## 2013-08-05 MED ORDER — METOCLOPRAMIDE HCL 5 MG PO TABS
5.0000 mg | ORAL_TABLET | Freq: Three times a day (TID) | ORAL | Status: DC | PRN
  Filled 2013-08-05: qty 2

## 2013-08-05 MED ORDER — HYDROMORPHONE HCL PF 1 MG/ML IJ SOLN
INTRAMUSCULAR | Status: AC
  Filled 2013-08-05: qty 1

## 2013-08-05 MED ORDER — HYDROCODONE-ACETAMINOPHEN 5-325 MG PO TABS
ORAL_TABLET | ORAL | Status: AC
  Administered 2013-08-05: 2 via ORAL
  Filled 2013-08-05: qty 2

## 2013-08-05 MED ORDER — FENTANYL CITRATE 0.05 MG/ML IJ SOLN: 50.0000 ug | INTRAMUSCULAR | Status: DC | PRN

## 2013-08-05 MED ORDER — SORBITOL 70 % SOLN: 30.0000 mL | Freq: Every day | Status: DC | PRN

## 2013-08-05 MED ORDER — FENTANYL CITRATE 0.05 MG/ML IJ SOLN
INTRAMUSCULAR | Status: AC
  Filled 2013-08-05: qty 2

## 2013-08-05 MED ORDER — ROCURONIUM BROMIDE 100 MG/10ML IV SOLN
INTRAVENOUS | Status: DC | PRN
  Administered 2013-08-05: 50 mg via INTRAVENOUS

## 2013-08-05 MED ORDER — OXYCODONE HCL 5 MG/5ML PO SOLN: 5.0000 mg | Freq: Once | ORAL | Status: AC | PRN

## 2013-08-05 MED ORDER — GLYCOPYRROLATE 0.2 MG/ML IJ SOLN
INTRAMUSCULAR | Status: DC | PRN
  Administered 2013-08-05: .8 mg via INTRAVENOUS

## 2013-08-05 MED ORDER — BUPIVACAINE HCL (PF) 0.25 % IJ SOLN
INTRAMUSCULAR | Status: AC
  Filled 2013-08-05: qty 30

## 2013-08-05 MED ORDER — DIPHENHYDRAMINE HCL 12.5 MG/5ML PO ELIX: 25.0000 mg | ORAL_SOLUTION | ORAL | Status: DC | PRN

## 2013-08-05 MED ORDER — FENTANYL CITRATE 0.05 MG/ML IJ SOLN
INTRAMUSCULAR | Status: AC
  Filled 2013-08-05: qty 5

## 2013-08-05 MED ORDER — FENTANYL CITRATE 0.05 MG/ML IJ SOLN
INTRAMUSCULAR | Status: DC | PRN
  Administered 2013-08-05: 50 ug via INTRAVENOUS
  Administered 2013-08-05 (×2): 100 ug via INTRAVENOUS

## 2013-08-05 MED ORDER — 0.9 % SODIUM CHLORIDE (POUR BTL) OPTIME
TOPICAL | Status: DC | PRN
  Administered 2013-08-05: 1000 mL

## 2013-08-05 MED ORDER — OXYCODONE HCL 5 MG PO TABS
5.0000 mg | ORAL_TABLET | Freq: Once | ORAL | Status: AC | PRN
  Administered 2013-08-05: 5 mg via ORAL

## 2013-08-05 MED ORDER — DIAZEPAM 5 MG PO TABS
5.0000 mg | ORAL_TABLET | Freq: Four times a day (QID) | ORAL | Status: DC | PRN
  Administered 2013-08-05: 5 mg via ORAL
  Filled 2013-08-05: qty 1

## 2013-08-05 MED ORDER — OXYCODONE HCL 5 MG PO TABS
ORAL_TABLET | ORAL | Status: AC
  Filled 2013-08-05: qty 1

## 2013-08-05 MED ORDER — HYDROCODONE-ACETAMINOPHEN 5-325 MG PO TABS
1.0000 | ORAL_TABLET | Freq: Once | ORAL | Status: AC
  Administered 2013-08-05: 2 via ORAL

## 2013-08-05 MED ORDER — ONDANSETRON HCL 4 MG/2ML IJ SOLN: 4.0000 mg | Freq: Four times a day (QID) | INTRAMUSCULAR | Status: DC | PRN

## 2013-08-05 MED ORDER — MORPHINE SULFATE 2 MG/ML IJ SOLN
1.0000 mg | INTRAMUSCULAR | Status: DC | PRN
  Administered 2013-08-05: 1 mg via INTRAVENOUS
  Filled 2013-08-05: qty 1

## 2013-08-05 MED ORDER — LACTATED RINGERS IV SOLN
INTRAVENOUS | Status: DC | PRN
  Administered 2013-08-05: 11:00:00 via INTRAVENOUS

## 2013-08-05 MED ORDER — HYDROMORPHONE HCL PF 1 MG/ML IJ SOLN
0.2500 mg | INTRAMUSCULAR | Status: DC | PRN
  Administered 2013-08-05 (×6): 0.5 mg via INTRAVENOUS

## 2013-08-05 MED ORDER — ROCURONIUM BROMIDE 50 MG/5ML IV SOLN
INTRAVENOUS | Status: AC
  Filled 2013-08-05: qty 1

## 2013-08-05 MED ORDER — MORPHINE SULFATE 2 MG/ML IJ SOLN
INTRAMUSCULAR | Status: AC
  Administered 2013-08-05: 2 mg via INTRAVENOUS
  Filled 2013-08-05: qty 1

## 2013-08-05 MED ORDER — OXYCODONE HCL 5 MG PO TABS
5.0000 mg | ORAL_TABLET | ORAL | Status: DC | PRN
  Administered 2013-08-05 – 2013-08-06 (×4): 15 mg via ORAL
  Filled 2013-08-05 (×4): qty 3

## 2013-08-05 MED ORDER — POLYETHYLENE GLYCOL 3350 17 G PO PACK: 17.0000 g | PACK | Freq: Every day | ORAL | Status: DC | PRN

## 2013-08-05 MED ORDER — HYDROCODONE-ACETAMINOPHEN 5-325 MG PO TABS
1.0000 | ORAL_TABLET | ORAL | Status: DC | PRN
  Administered 2013-08-06: 2 via ORAL
  Filled 2013-08-05: qty 2

## 2013-08-05 MED ORDER — HYDROCODONE-ACETAMINOPHEN 5-325 MG PO TABS: 1.0000 | ORAL_TABLET | Freq: Four times a day (QID) | ORAL | Status: DC | PRN

## 2013-08-05 MED ORDER — ASPIRIN EC 325 MG PO TBEC: 325.0000 mg | DELAYED_RELEASE_TABLET | Freq: Two times a day (BID) | ORAL | Status: DC

## 2013-08-05 MED ORDER — TIZANIDINE HCL 4 MG PO TABS
4.0000 mg | ORAL_TABLET | Freq: Once | ORAL | Status: DC
  Filled 2013-08-05: qty 1

## 2013-08-05 MED ORDER — LACTATED RINGERS IV SOLN
INTRAVENOUS | Status: DC
  Administered 2013-08-05: 09:00:00 via INTRAVENOUS

## 2013-08-05 SURGICAL SUPPLY — 52 items
BANDAGE ELASTIC 4 VELCRO ST LF (GAUZE/BANDAGES/DRESSINGS) ×2 IMPLANT
BANDAGE ELASTIC 6 VELCRO ST LF (GAUZE/BANDAGES/DRESSINGS) ×2 IMPLANT
BANDAGE ESMARK 6X9 LF (GAUZE/BANDAGES/DRESSINGS) ×1 IMPLANT
BANDAGE GAUZE ELAST BULKY 4 IN (GAUZE/BANDAGES/DRESSINGS) IMPLANT
BENZOIN TINCTURE PRP APPL 2/3 (GAUZE/BANDAGES/DRESSINGS) IMPLANT
BNDG COHESIVE 6X5 TAN STRL LF (GAUZE/BANDAGES/DRESSINGS) ×2 IMPLANT
BNDG ESMARK 4X9 LF (GAUZE/BANDAGES/DRESSINGS) ×2 IMPLANT
BNDG ESMARK 6X9 LF (GAUZE/BANDAGES/DRESSINGS) ×2
BNDG GAUZE ELAST 4 BULKY (GAUZE/BANDAGES/DRESSINGS) ×2 IMPLANT
CUFF TOURNIQUET SINGLE 34IN LL (TOURNIQUET CUFF) ×2 IMPLANT
CUFF TOURNIQUET SINGLE 44IN (TOURNIQUET CUFF) IMPLANT
DRAPE U-SHAPE 47X51 STRL (DRAPES) ×2 IMPLANT
DURAPREP 26ML APPLICATOR (WOUND CARE) ×2 IMPLANT
ELECT CAUTERY BLADE 6.4 (BLADE) ×2 IMPLANT
ELECT REM PT RETURN 9FT ADLT (ELECTROSURGICAL) ×2
ELECTRODE REM PT RTRN 9FT ADLT (ELECTROSURGICAL) ×1 IMPLANT
FACESHIELD LNG OPTICON STERILE (SAFETY) ×2 IMPLANT
GAUZE XEROFORM 1X8 LF (GAUZE/BANDAGES/DRESSINGS) ×2 IMPLANT
GLOVE BIO SURGEON STRL SZ 6.5 (GLOVE) ×2 IMPLANT
GLOVE BIO SURGEON STRL SZ7.5 (GLOVE) ×2 IMPLANT
GLOVE BIOGEL PI IND STRL 6.5 (GLOVE) ×1 IMPLANT
GLOVE BIOGEL PI IND STRL 7.5 (GLOVE) ×1 IMPLANT
GLOVE BIOGEL PI INDICATOR 6.5 (GLOVE) ×1
GLOVE BIOGEL PI INDICATOR 7.5 (GLOVE) ×1
GLOVE SURG SS PI 7.5 STRL IVOR (GLOVE) ×6 IMPLANT
GOWN PREVENTION PLUS XLARGE (GOWN DISPOSABLE) ×2 IMPLANT
GOWN STRL NON-REIN LRG LVL3 (GOWN DISPOSABLE) ×2 IMPLANT
IMPERVIOUS U-DRAPE ×2 IMPLANT
KIT BASIN OR (CUSTOM PROCEDURE TRAY) ×2 IMPLANT
KIT IMPLANT SUTURE PARS (Kit) ×2 IMPLANT
KIT ROOM TURNOVER OR (KITS) ×2 IMPLANT
NEEDLE HYPO 25GX1X1/2 BEV (NEEDLE) ×2 IMPLANT
NS IRRIG 1000ML POUR BTL (IV SOLUTION) ×2 IMPLANT
PACK ORTHO EXTREMITY (CUSTOM PROCEDURE TRAY) ×2 IMPLANT
PAD ARMBOARD 7.5X6 YLW CONV (MISCELLANEOUS) ×4 IMPLANT
PAD CAST 4YDX4 CTTN HI CHSV (CAST SUPPLIES) ×1 IMPLANT
PADDING CAST COTTON 4X4 STRL (CAST SUPPLIES) ×1
PADDING CAST COTTON 6X4 STRL (CAST SUPPLIES) IMPLANT
SPLINT FIBERGLASS 4X30 (CAST SUPPLIES) ×2 IMPLANT
SPONGE GAUZE 4X4 12PLY (GAUZE/BANDAGES/DRESSINGS) IMPLANT
SPONGE GAUZE 4X4 12PLY STER LF (GAUZE/BANDAGES/DRESSINGS) ×2 IMPLANT
STRIP CLOSURE SKIN 1/2X4 (GAUZE/BANDAGES/DRESSINGS) IMPLANT
SUT ETHILON 2 0 FS 18 (SUTURE) ×2 IMPLANT
SUT FIBERWIRE #2 38 T-5 BLUE (SUTURE)
SUT VIC AB 2-0 CT1 27 (SUTURE) ×1
SUT VIC AB 2-0 CT1 TAPERPNT 27 (SUTURE) ×1 IMPLANT
SUTURE FIBERWR #2 38 T-5 BLUE (SUTURE) IMPLANT
SYR CONTROL 10ML LL (SYRINGE) ×2 IMPLANT
TOWEL OR 17X24 6PK STRL BLUE (TOWEL DISPOSABLE) ×2 IMPLANT
TOWEL OR 17X26 10 PK STRL BLUE (TOWEL DISPOSABLE) ×2 IMPLANT
UNDERPAD 30X30 INCONTINENT (UNDERPADS AND DIAPERS) ×2 IMPLANT
WATER STERILE IRR 1000ML POUR (IV SOLUTION) ×2 IMPLANT

## 2013-08-05 NOTE — Preoperative (Signed)
Beta Blockers   Reason not to administer Beta Blockers:Not Applicable 

## 2013-08-05 NOTE — Anesthesia Preprocedure Evaluation (Addendum)
Anesthesia Evaluation  Patient identified by MRN, date of birth, ID band Patient awake    Reviewed: Allergy & Precautions, H&P , NPO status , Patient's Chart, lab work & pertinent test results  Airway Mallampati: II TM Distance: >3 FB Neck ROM: Full    Dental   Pulmonary  breath sounds clear to auscultation        Cardiovascular Rhythm:Regular Rate:Normal     Neuro/Psych    GI/Hepatic (+)     substance abuse  alcohol use,   Endo/Other    Renal/GU      Musculoskeletal   Abdominal (+) + obese,   Peds  Hematology   Anesthesia Other Findings Numbness of R leg  Reproductive/Obstetrics                          Anesthesia Physical Anesthesia Plan  ASA: III  Anesthesia Plan: General   Post-op Pain Management:    Induction: Intravenous  Airway Management Planned: Oral ETT  Additional Equipment:   Intra-op Plan:   Post-operative Plan: Extubation in OR  Informed Consent: I have reviewed the patients History and Physical, chart, labs and discussed the procedure including the risks, benefits and alternatives for the proposed anesthesia with the patient or authorized representative who has indicated his/her understanding and acceptance.     Plan Discussed with: CRNA and Surgeon  Anesthesia Plan Comments:         Anesthesia Quick Evaluation
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
## 2013-08-05 NOTE — Discharge Instructions (Signed)
1. Strict non weight bearing to right lower extremity 2. Take meds as prescribed 3. Keep splint clean and dry 4. Elevate on 3 pillows while in bed  What to eat:  For your first meals, you should eat lightly; only small meals initially.  If you do not have nausea, you may eat larger meals.  Avoid spicy, greasy and heavy food.    General Anesthesia, Adult, Care After  Refer to this sheet in the next few weeks. These instructions provide you with information on caring for yourself after your procedure. Your health care provider may also give you more specific instructions. Your treatment has been planned according to current medical practices, but problems sometimes occur. Call your health care provider if you have any problems or questions after your procedure.  WHAT TO EXPECT AFTER THE PROCEDURE  After the procedure, it is typical to experience:  Sleepiness.  Nausea and vomiting. HOME CARE INSTRUCTIONS  For the first 24 hours after general anesthesia:  Have a responsible person with you.  Do not drive a car. If you are alone, do not take public transportation.  Do not drink alcohol.  Do not take medicine that has not been prescribed by your health care provider.  Do not sign important papers or make important decisions.  You may resume a normal diet and activities as directed by your health care provider.  Change bandages (dressings) as directed.  If you have questions or problems that seem related to general anesthesia, call the hospital and ask for the anesthetist or anesthesiologist on call. SEEK MEDICAL CARE IF:  You have nausea and vomiting that continue the day after anesthesia.  You develop a rash. SEEK IMMEDIATE MEDICAL CARE IF:  You have difficulty breathing.  You have chest pain.  You have any allergic problems. Document Released: 09/01/2000 Document Revised: 01/26/2013 Document Reviewed: 12/09/2012  [facility] Patient Information 2014 [place] [place].

## 2013-08-05 NOTE — Progress Notes (Signed)
Spoke with Dr. Roda ShuttersXu regarding patients pain of 8/10. Thought of placing nerve block but Anesthesiologist did not want to do that intervention. Ordered by Dr. Roda ShuttersXu to give 5 mg valium IV and anticipate orders to admit for pain control.

## 2013-08-05 NOTE — Op Note (Signed)
   Date of Surgery: 08/05/2013  INDICATIONS: [name] is a 38 y.o.-year-old male who sustained an acute right achilles rupture. The risks and benefits of the procedure discussed with the patient prior to the procedure and all questions were answered; consent was obtained.  PREOPERATIVE DIAGNOSIS: right achilles rupture, acute  POSTOPERATIVE DIAGNOSIS: Same   PROCEDURE: Treatment of right achilles rupture , without graft  SURGEON: N. Glee ArvinMichael Lorilyn [name], M.D.   ANESTHESIA: general   IV FLUIDS AND URINE: See anesthesia record   ESTIMATED BLOOD LOSS: minimal  IMPLANTS: [employer] PARS system  COMPLICATIONS: None.   DESCRIPTION OF PROCEDURE: The patient was brought to the operating room and placed prone on the operating table. The patient's leg had been signed prior to the procedure. The patient had the anesthesia placed by the anesthesiologist. The prep verification and incision time-outs were performed to confirm that this was the correct patient, site, side and location. The patient had an SCD on the opposite lower extremity. A 2 cm longitudinal incision based over the rupture was used.  Blunt dissection was taken down to the level of the paratenon.  The paratenon was sharply incised in line with the incision.  The rupture was exposed.  A space was created between the paratenon and the achilles tendon on both sides going up and down the tendon.  The sural nerve was visualized and protected during the procedure.  An alise clamp was used to pull tension on the tendon end.  The sled was advanced up the tendon proximally making sure the tendon was straddled between the arms.  The sutures were passed sequentially and then brought out through the incision.  The same steps were then repeated for the distal portion of the tendon.  The foot was then placed in maximum plantarflexion and the sutures were tied down.  The wound was then irrigated thoroughly.  The paratenon was was closed using 0 vicryl.  The  subcutaneous layer was closed with 2-0 vicryl and the skin with interrupted 2-0 nylon.  A sterile dressing was applied.  A short leg splint was placed with the ankle in maximum plantarflexion.  The patient awoke from anesthesia uneventfully and transported to the PACU.  POSTOPERATIVE PLAN: The patient will be non weight bearing for two weeks and return for suture removal and transition to a fracture boot.  The patient will be placed on DVT chemoprophylaxis.  Mayra ReelN. Wayne Wayne Telleria, MD [facility] [phone] 12:48 PM

## 2013-08-05 NOTE — Anesthesia Postprocedure Evaluation (Signed)
Anesthesia Post Note  Patient: Wayne RobinsonLashuarn Aceituno  Procedure(s) Performed: Procedure(s) (LRB): RIGHT ACHILLES TENDON REPAIR (Right)  Anesthesia type: General  Patient location: PACU  Post pain: Pain level controlled and Adequate analgesia  Post assessment: Post-op Vital signs reviewed, Patient's Cardiovascular Status Stable, Respiratory Function Stable, Patent Airway and Pain level controlled  Last Vitals:  Filed Vitals:   08/05/13 1400  BP: 140/96  Pulse: 84  Temp:   Resp: 14    Post vital signs: Reviewed and stable  Level of consciousness: awake, alert  and oriented  Complications: No apparent anesthesia complications

## 2013-08-05 NOTE — Transfer of Care (Signed)
Immediate Anesthesia Transfer of Care Note  Patient: Wayne RobinsonLashuarn Solt  Procedure(s) Performed: Procedure(s): RIGHT ACHILLES TENDON REPAIR (Right)  Patient Location: PACU  Anesthesia Type:General  Level of Consciousness: sedated and patient cooperative  Airway & Oxygen Therapy: Patient Spontanous Breathing and Patient connected to face mask oxygen  Post-op Assessment: Report given to PACU RN and Post -op Vital signs reviewed and stable  Post vital signs: Reviewed and stable  Complications: No apparent anesthesia complications

## 2013-08-06 MED ORDER — INFLUENZA VAC SPLIT QUAD 0.5 ML IM SUSP: 0.5000 mL | INTRAMUSCULAR | Status: DC

## 2013-08-06 NOTE — Progress Notes (Signed)
Orthopedic Tech Progress Note Patient Details:  Wayne RobinsonLashuarn Sossamon 01/15/1976 [id number] Patient to be discharged; no need for application of OHF Patient ID: Wayne [name], male   DOB: 09/10/1975, 38 y.o.   MRN: [id number]   Wayne [name] 08/06/2013, 11:15 AM

## 2013-08-06 NOTE — Progress Notes (Signed)
Discharge instructions discussed with patient. Home medications discussed. Prescriptions for Percocet, Norco and Zanaflex given to patient. Explained importance of spacing out narcotic medications not to intake more that 4,000mg  og Tylenol a day. Patient instructed to make follow up appointment with Dr. Roda ShuttersXu in 2 weeks since office closed today and appointment could not be made. Patient verbally understands instructions.

## 2013-08-06 NOTE — Progress Notes (Signed)
Subjective: 1 Day Post-Op Procedure(s) (LRB): RIGHT ACHILLES TENDON REPAIR (Right) Patient reports pain as mild and moderate.    Objective: Vital signs in last 24 hours: Temp:  [97.2 F (36.2 C)-100.3 F (37.9 C)] 98.5 F (36.9 C) (02/28 0939) Pulse Rate:  [65-100] 90 (02/28 0939) Resp:  [13-18] 18 (02/28 0939) BP: (121-157)/(72-111) 124/91 mmHg (02/28 0939) SpO2:  [93 %-100 %] 93 % (02/28 0939)  Intake/Output from previous day: 02/27 0701 - 02/28 0700 In: 638.7 [P.O.:340; I.V.:198.7; IV Piggyback:100] Out: 1100 [Urine:1100] Intake/Output this shift:     Recent Labs  08/05/13 0822  HGB 11.8*    Recent Labs  08/05/13 0822  WBC 4.1  RBC 4.46  HCT 36.9*  PLT 183    Recent Labs  08/05/13 0822  NA 138  K 4.0  CL 103  CO2 24  BUN 9  CREATININE 1.02  GLUCOSE 90  CALCIUM 9.2   No results found for this basename: LABPT, INR,  in the last 72 hours  Sensation intact distally Dorsiflexion/Plantar flexion intact Compartment soft Negative passive stretch No distal swelling  Assessment/Plan: 1 Day Post-Op Procedure(s) (LRB): RIGHT ACHILLES TENDON REPAIR (Right) Discharge home   [facility] DedhamETRARCA,BRIAN 08/06/2013, 10:44 AM

## 2013-08-06 NOTE — Progress Notes (Signed)
Patient taken out of hospital for discharge via wheelchair. Patient has crutches with him.
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
## 2013-08-06 NOTE — Evaluation (Signed)
Physical Therapy Evaluation Patient Details Name: Janeice RobinsonLashuarn Ratto MRN: [id number] DOB: 12/13/1975 Today's Date: 08/06/2013 Time: [id number] PT Time Calculation (min): 14 min  PT Assessment / Plan / Recommendation History of Present Illness  [age] yo male s/p achilles RT LE repair  Clinical Impression  Patient evaluated by Physical Therapy with no further acute PT needs identified. All education has been completed and the patient has no further questions. At the time of PT eval pt was able to demonstrate proper form for transfers and ambulation. VC's for safety. Pt states he feels confident returning home. See below for any follow-up Physial Therapy or equipment needs. PT is signing off. Thank you for this referral.     PT Assessment  Patent does not need any further PT services    Follow Up Recommendations  No PT follow up    Does the patient have the potential to tolerate intense rehabilitation      Barriers to Discharge        Equipment Recommendations       Recommendations for Other Services     Frequency      Precautions / Restrictions Precautions Precautions: Fall Restrictions Weight Bearing Restrictions: Yes RLE Weight Bearing: Non weight bearing   Pertinent Vitals/Pain Pt reports minimal pain throughout session.       Mobility  Bed Mobility Overal bed mobility: Modified Independent Bed Mobility: Supine to Sit Supine to sit: HOB elevated;Supervision Sit to supine: Supervision;HOB elevated General bed mobility comments: No physical assist required to transition to EOB. Pt able to elevate RLE off pillows to initiate transfer.  Transfers Overall transfer level: Needs assistance Equipment used: Crutches Transfers: Sit to/from Stand Sit to Stand: Supervision General transfer comment: Pt able to appropriately manage crutches in one hand to stand, and then separate for initiation of ambulation. VC's for specific technique with crutches sit<>stand. Practiced transfers  multiple times for safety and proper technique.  Ambulation/Gait Ambulation/Gait assistance: Min guard Ambulation Distance (Feet): 50 Feet Assistive device: Crutches Gait Pattern/deviations:  (Hop-to) Gait velocity: Decreased Gait velocity interpretation: Below normal speed for age/gender General Gait Details: VC's for sequencing and safety awareness. Specific cueing for improved posture.     Exercises     PT Diagnosis:    PT Problem List:   PT Treatment Interventions:       PT Goals(Current goals can be found in the care plan section) Acute Rehab PT Goals PT Goal Formulation: No goals set, d/c therapy  Visit Information  Last PT Received On: 08/06/13 Assistance Needed: +1 History of Present Illness: 38 yo male s/p achilles RT LE repair       Prior Functioning  Home Living Family/patient expects to be discharged to:: Private residence Living Arrangements: Parent Available Help at Discharge: Family;Available 24 hours/day Type of Home: House Home Access: Level entry Home Layout: One level Home Equipment: Crutches Additional Comments: 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 Pt will have (A) of girlfriend at d/c home.  Prior Function Level of Independence: Independent Comments: using crutches PTA due to pending surg. [facility] Pt is now NWB Communication Communication: No difficulties Dominant Hand: Right    Cognition  Cognition Arousal/Alertness: Awake/alert Behavior During Therapy: WFL for tasks assessed/performed Overall Cognitive Status: Within Functional Limits for tasks assessed Area of Impairment: Safety/judgement Safety/Judgement: Decreased awareness of safety General Comments: Pt reports "i can do it" upon sitting up c/o LT LE pain  at calf and thigh. Pt states "i must have been laying in the bed wrong" Pt was unable to complete sit <>stand from bed surface without surface elevated and several  attempts.     Extremity/Trunk Assessment Upper Extremity Assessment Upper Extremity Assessment: Defer to OT evaluation Lower Extremity Assessment Lower Extremity Assessment: RLE deficits/detail RLE Deficits / Details: surg NWB Cervical / Trunk Assessment Cervical / Trunk Assessment: Normal   Balance Balance Overall balance assessment: No apparent balance deficits (not formally assessed)  End of Session PT - End of Session Equipment Utilized During Treatment: Gait belt Activity Tolerance: Patient tolerated treatment well Patient left: in chair;with call bell/phone within reach;with family/visitor present Nurse Communication: Mobility status  GP Functional Assessment Tool Used: Clinical judgement Functional Limitation: Mobility: Walking and moving around Mobility: Walking and Moving Around Current Status [phone]): At least 1 percent but less than 20 percent impaired, limited or restricted Mobility: Walking and Moving Around Goal Status [phone]): At least 1 percent but less than 20 percent impaired, limited or restricted Mobility: Walking and Moving Around Discharge Status [phone]): At least 1 percent but less than 20 percent impaired, limited or restricted   Ruthann Cancer 08/06/2013, 4:22 PM  Ruthann Cancer, PT, DPT [phone]

## 2013-08-08 ENCOUNTER — Encounter (HOSPITAL_COMMUNITY): Payer: Self-pay | Admitting: Orthopaedic Surgery
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
## 2013-09-29 ENCOUNTER — Ambulatory Visit: Payer: Self-pay | Admitting: Hematology & Oncology

## 2013-09-29 ENCOUNTER — Telehealth: Payer: Self-pay | Admitting: Hematology & Oncology

## 2013-09-29 ENCOUNTER — Other Ambulatory Visit: Payer: Self-pay | Admitting: Lab

## 2013-09-29 NOTE — Telephone Encounter (Signed)
Pt scheduled 4-23 no show to 5-7

## 2013-10-03 ENCOUNTER — Ambulatory Visit: Payer: Self-pay | Attending: Orthopaedic Surgery | Admitting: Physical Therapy

## 2013-10-03 DIAGNOSIS — R262 Difficulty in walking, not elsewhere classified: Secondary | ICD-10-CM | POA: Insufficient documentation

## 2013-10-03 DIAGNOSIS — IMO0001 Reserved for inherently not codable concepts without codable children: Secondary | ICD-10-CM | POA: Insufficient documentation

## 2013-10-03 DIAGNOSIS — M25669 Stiffness of unspecified knee, not elsewhere classified: Secondary | ICD-10-CM | POA: Insufficient documentation

## 2013-10-03 DIAGNOSIS — M25579 Pain in unspecified ankle and joints of unspecified foot: Secondary | ICD-10-CM | POA: Insufficient documentation

## 2013-10-03 DIAGNOSIS — R609 Edema, unspecified: Secondary | ICD-10-CM | POA: Insufficient documentation

## 2013-10-05 ENCOUNTER — Ambulatory Visit: Payer: Self-pay | Admitting: Rehabilitation

## 2013-10-11 ENCOUNTER — Ambulatory Visit: Payer: Self-pay | Attending: Orthopaedic Surgery | Admitting: Physical Therapy

## 2013-10-11 DIAGNOSIS — M25579 Pain in unspecified ankle and joints of unspecified foot: Secondary | ICD-10-CM | POA: Insufficient documentation

## 2013-10-11 DIAGNOSIS — R262 Difficulty in walking, not elsewhere classified: Secondary | ICD-10-CM | POA: Insufficient documentation

## 2013-10-11 DIAGNOSIS — M25669 Stiffness of unspecified knee, not elsewhere classified: Secondary | ICD-10-CM | POA: Insufficient documentation

## 2013-10-11 DIAGNOSIS — R609 Edema, unspecified: Secondary | ICD-10-CM | POA: Insufficient documentation

## 2013-10-11 DIAGNOSIS — IMO0001 Reserved for inherently not codable concepts without codable children: Secondary | ICD-10-CM | POA: Insufficient documentation

## 2013-10-13 ENCOUNTER — Other Ambulatory Visit (HOSPITAL_BASED_OUTPATIENT_CLINIC_OR_DEPARTMENT_OTHER): Payer: Self-pay | Admitting: Lab

## 2013-10-13 ENCOUNTER — Ambulatory Visit (HOSPITAL_BASED_OUTPATIENT_CLINIC_OR_DEPARTMENT_OTHER): Payer: Self-pay | Admitting: Hematology & Oncology

## 2013-10-13 ENCOUNTER — Encounter: Payer: Self-pay | Admitting: Hematology & Oncology

## 2013-10-13 VITALS — BP 129/82 | HR 85 | Temp 97.8°F | Resp 18 | Ht 70.0 in | Wt 278.0 lb

## 2013-10-13 DIAGNOSIS — E538 Deficiency of other specified B group vitamins: Secondary | ICD-10-CM

## 2013-10-13 DIAGNOSIS — D72819 Decreased white blood cell count, unspecified: Secondary | ICD-10-CM

## 2013-10-13 DIAGNOSIS — D509 Iron deficiency anemia, unspecified: Secondary | ICD-10-CM

## 2013-10-13 LAB — CBC WITH DIFFERENTIAL (CANCER CENTER ONLY)
BASO#: 0 10*3/uL (ref 0.0–0.2)
BASO%: 0.3 % (ref 0.0–2.0)
EOS%: 6.4 % (ref 0.0–7.0)
Eosinophils Absolute: 0.3 10*3/uL (ref 0.0–0.5)
HCT: 37.4 % — ABNORMAL LOW (ref 38.7–49.9)
HGB: 12.2 g/dL — ABNORMAL LOW (ref 13.0–17.1)
LYMPH#: 1.3 10*3/uL (ref 0.9–3.3)
LYMPH%: 33.2 % (ref 14.0–48.0)
MCH: 31.3 pg (ref 28.0–33.4)
MCHC: 32.6 g/dL (ref 32.0–35.9)
MCV: 96 fL (ref 82–98)
MONO#: 0.5 10*3/uL (ref 0.1–0.9)
MONO%: 11.5 % (ref 0.0–13.0)
NEUT#: 1.9 10*3/uL (ref 1.5–6.5)
NEUT%: 48.6 % (ref 40.0–80.0)
Platelets: 160 10*3/uL (ref 145–400)
RBC: 3.9 10*6/uL — ABNORMAL LOW (ref 4.20–5.70)
RDW: 13.4 % (ref 11.1–15.7)
WBC: 3.9 10*3/uL — ABNORMAL LOW (ref 4.0–10.0)

## 2013-10-13 LAB — IRON AND TIBC CHCC
%SAT: 15 % — ABNORMAL LOW (ref 20–55)
Iron: 59 ug/dL (ref 42–163)
TIBC: 383 ug/dL (ref 202–409)
UIBC: 324 ug/dL (ref 117–376)

## 2013-10-13 LAB — CHCC SATELLITE - SMEAR

## 2013-10-13 LAB — FERRITIN CHCC: Ferritin: 33 ng/ml (ref 22–316)

## 2013-10-13 NOTE — Progress Notes (Signed)
Hematology and Oncology Follow Up Visit  Wayne [name] [id number] 01/14/1976 38 y.o. 10/13/2013   Principle Diagnosis:   Chronic benign leukopenia  Current Therapy:    Observation     Interim History:  Wayne [name] is back for followup. We first saw him back in February. He was supposed to have surgery for his right Achilles tendon that had ruptured. However, and he had a mild leukopenia. Her workup was negative for any hematologic issue.  His problem is now is that he has bleeding hemorrhoids. He had him back then. I told him that he probably needs to see a gastroenterologist or surgeon for these if they continue to cause him problems.  He's gained quite a bit of weight. He is not able to exercise. He says that the Achilles surgery went well. He's had no infections. Unfortunately, he is doing what he really wants to do and the recovery is a little more prolonged.  He's had no rashes. He's had no fever. He's had no change in bowel or bladder habits.  Medications: Current outpatient prescriptions:ferrous sulfate 325 (65 FE) MG tablet, Take 325 mg by mouth daily., Disp: , Rfl: ;  indomethacin (INDOCIN) 50 MG capsule, Take 50 mg by mouth as needed. Takes it for gout, Disp: , Rfl: ;  Misc Natural Products (FIBER 7) POWD, Take by mouth every morning., Disp: , Rfl: ;  Multiple Vitamins-Minerals (MULTIVITAMIN PO), Take by mouth every morning., Disp: , Rfl:  oxyCODONE-acetaminophen (PERCOCET) 5-325 MG per tablet, Take 1 tablet by mouth every 4 (four) hours as needed for severe pain., Disp: 60 tablet, Rfl: 0  Allergies:  Allergies  Allergen Reactions  . Amoxicillin Other (See Comments)    Blisters on hands and feet  . Doxycycline Other (See Comments)    Blisters on hands and feet  . Sulfamethoxazole-Trimethoprim Other (See Comments)    Blisters on hands and feet    Past Medical History, Surgical history, Social history, and Family History were reviewed and updated.  Review of Systems: As  above  Physical Exam:  height is 5\' 10"  (1.778 m) and weight is 278 lb (126.1 kg). His oral temperature is 97.8 F (36.6 C). His blood pressure is 129/82 and his pulse is 85. His respiration is 18.   Obese [name]. Neck is supple with no adenopathy. Lungs are clear. Cardiac exam regular in rhythm. Abdomen soft. Mildly obese. He is no fluid wave. There is no palpable liver or spleen tip. Neck exam no tenderness over the spine ribs or hips. Extremities shows the cast on his right lower leg. Skin exam no rashes.  Lab Results  Component Value Date   WBC 3.9* 10/13/2013   HGB 12.2* 10/13/2013   HCT 37.4* 10/13/2013   MCV 96 10/13/2013   PLT 160 10/13/2013     Chemistry      Component Value Date/Time   NA 138 08/05/2013 0822   K 4.0 08/05/2013 0822   CL 103 08/05/2013 0822   CO2 24 08/05/2013 0822   BUN 9 08/05/2013 0822   CREATININE 1.02 08/05/2013 0822      Component Value Date/Time   CALCIUM 9.2 08/05/2013 0822   ALKPHOS 50 06/13/2011 0457   AST 22 06/13/2011 0457   ALT 22 06/13/2011 0457   BILITOT 0.2* 06/13/2011 0457         Impression and Plan: [name] is 38 year old gentleman with chronic benign leukopenia. I looked at his blood smear. I do not see anything that  looked suspicious for a bone marrow disorder. As such, I think we can let him go from the practice. 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 This is seen in about 20-25% back Americans. He does not lead to any further and bone marrow issues in the future.  I think that is a [name] has any problems, and it might be from anemia if he continues to have hemorrhoidal bleeding. I told him to take iron supplements with vitamin C to help with this.  Am glad that [name] got through his Achilles surgery.  I'll be more than happy to see him back in the future.   Wayne MachoPeter R Ennever, MD 5/7/20157:07 PM

## 2013-10-14 ENCOUNTER — Ambulatory Visit: Payer: Self-pay | Admitting: Physical Therapy

## 2013-10-18 ENCOUNTER — Ambulatory Visit: Payer: Self-pay | Admitting: Rehabilitation

## 2013-10-21 ENCOUNTER — Ambulatory Visit: Payer: Self-pay | Admitting: Rehabilitation

## 2013-10-21 LAB — HEMOGLOBINOPATHY EVALUATION
Hemoglobin Other: 0 %
Hgb A2 Quant: 2.7 % (ref 2.2–3.2)
Hgb A: 97.3 % (ref 96.8–97.8)
Hgb F Quant: 0 % (ref 0.0–2.0)
Hgb S Quant: 0 %

## 2013-10-21 LAB — RETICULOCYTES (CHCC)
ABS Retic: 75.4 10*3/uL (ref 19.0–186.0)
RBC.: 3.97 MIL/uL — ABNORMAL LOW (ref 4.22–5.81)
Retic Ct Pct: 1.9 % (ref 0.4–2.3)

## 2013-10-21 LAB — ALPHA-THALASSEMIA GENOTYPR

## 2013-10-25 ENCOUNTER — Ambulatory Visit: Payer: Self-pay | Admitting: Rehabilitation

## 2013-10-28 ENCOUNTER — Ambulatory Visit: Payer: Self-pay | Admitting: Rehabilitation

## 2013-11-01 ENCOUNTER — Ambulatory Visit: Payer: Self-pay | Admitting: Rehabilitation

## 2013-11-04 ENCOUNTER — Ambulatory Visit: Payer: Self-pay | Admitting: Physical Therapy

## 2013-11-07 ENCOUNTER — Ambulatory Visit: Payer: Self-pay | Admitting: Physical Therapy

## 2013-11-09 ENCOUNTER — Ambulatory Visit: Payer: Self-pay | Admitting: Rehabilitation

## 2013-11-16 ENCOUNTER — Ambulatory Visit: Payer: Self-pay | Attending: Orthopaedic Surgery | Admitting: Rehabilitation

## 2013-11-16 DIAGNOSIS — R609 Edema, unspecified: Secondary | ICD-10-CM | POA: Insufficient documentation

## 2013-11-16 DIAGNOSIS — M25579 Pain in unspecified ankle and joints of unspecified foot: Secondary | ICD-10-CM | POA: Insufficient documentation

## 2013-11-16 DIAGNOSIS — M25669 Stiffness of unspecified knee, not elsewhere classified: Secondary | ICD-10-CM | POA: Insufficient documentation

## 2013-11-16 DIAGNOSIS — IMO0001 Reserved for inherently not codable concepts without codable children: Secondary | ICD-10-CM | POA: Insufficient documentation

## 2013-11-16 DIAGNOSIS — R262 Difficulty in walking, not elsewhere classified: Secondary | ICD-10-CM | POA: Insufficient documentation

## 2013-11-22 ENCOUNTER — Ambulatory Visit: Payer: Self-pay | Admitting: Physical Therapy

## 2013-12-06 ENCOUNTER — Ambulatory Visit: Payer: Self-pay | Admitting: Rehabilitation

## 2013-12-07 ENCOUNTER — Ambulatory Visit: Payer: Self-pay | Admitting: Physical Therapy

## 2013-12-08 ENCOUNTER — Ambulatory Visit: Payer: Self-pay | Admitting: Physical Therapy

## 2013-12-12 ENCOUNTER — Ambulatory Visit: Payer: Self-pay | Attending: Orthopaedic Surgery | Admitting: Physical Therapy

## 2013-12-12 DIAGNOSIS — M25579 Pain in unspecified ankle and joints of unspecified foot: Secondary | ICD-10-CM | POA: Insufficient documentation

## 2013-12-12 DIAGNOSIS — M25669 Stiffness of unspecified knee, not elsewhere classified: Secondary | ICD-10-CM | POA: Insufficient documentation

## 2013-12-12 DIAGNOSIS — R262 Difficulty in walking, not elsewhere classified: Secondary | ICD-10-CM | POA: Insufficient documentation

## 2013-12-12 DIAGNOSIS — R609 Edema, unspecified: Secondary | ICD-10-CM | POA: Insufficient documentation

## 2013-12-12 DIAGNOSIS — IMO0001 Reserved for inherently not codable concepts without codable children: Secondary | ICD-10-CM | POA: Insufficient documentation

## 2013-12-20 ENCOUNTER — Ambulatory Visit: Payer: Self-pay | Admitting: Physical Therapy

## 2013-12-27 ENCOUNTER — Ambulatory Visit: Payer: Self-pay | Admitting: Physical Therapy

## 2013-12-29 ENCOUNTER — Ambulatory Visit: Payer: Self-pay | Admitting: Physical Therapy

## 2014-01-03 ENCOUNTER — Ambulatory Visit: Payer: Self-pay | Admitting: Physical Therapy

## 2014-01-04 ENCOUNTER — Ambulatory Visit: Payer: Self-pay | Admitting: Physical Therapy

## 2014-12-20 ENCOUNTER — Emergency Department (HOSPITAL_BASED_OUTPATIENT_CLINIC_OR_DEPARTMENT_OTHER)
Admission: EM | Admit: 2014-12-20 | Discharge: 2014-12-21 | Disposition: A | Discharge status: Home / Self Care | Payer: Self-pay | Attending: Emergency Medicine | Admitting: Emergency Medicine

## 2014-12-20 ENCOUNTER — Encounter (HOSPITAL_BASED_OUTPATIENT_CLINIC_OR_DEPARTMENT_OTHER): Payer: Self-pay | Admitting: *Deleted

## 2014-12-20 DIAGNOSIS — M109 Gout, unspecified: Secondary | ICD-10-CM

## 2014-12-20 DIAGNOSIS — Z880 Allergy status to penicillin: Secondary | ICD-10-CM | POA: Insufficient documentation

## 2014-12-20 DIAGNOSIS — D649 Anemia, unspecified: Secondary | ICD-10-CM | POA: Insufficient documentation

## 2014-12-20 DIAGNOSIS — E669 Obesity, unspecified: Secondary | ICD-10-CM | POA: Insufficient documentation

## 2014-12-20 DIAGNOSIS — Z8719 Personal history of other diseases of the digestive system: Secondary | ICD-10-CM | POA: Insufficient documentation

## 2014-12-20 DIAGNOSIS — M10061 Idiopathic gout, right knee: Secondary | ICD-10-CM | POA: Insufficient documentation

## 2014-12-20 LAB — CBC WITH DIFFERENTIAL/PLATELET
Basophils Absolute: 0 10*3/uL (ref 0.0–0.1)
Basophils Relative: 0 % (ref 0–1)
Eosinophils Absolute: 0.2 10*3/uL (ref 0.0–0.7)
Eosinophils Relative: 4 % (ref 0–5)
HCT: 39.3 % (ref 39.0–52.0)
Hemoglobin: 12.6 g/dL — ABNORMAL LOW (ref 13.0–17.0)
Lymphocytes Relative: 21 % (ref 12–46)
Lymphs Abs: 1.1 10*3/uL (ref 0.7–4.0)
MCH: 29 pg (ref 26.0–34.0)
MCHC: 32.1 g/dL (ref 30.0–36.0)
MCV: 90.3 fL (ref 78.0–100.0)
Monocytes Absolute: 0.7 10*3/uL (ref 0.1–1.0)
Monocytes Relative: 14 % — ABNORMAL HIGH (ref 3–12)
Neutro Abs: 3.1 10*3/uL (ref 1.7–7.7)
Neutrophils Relative %: 61 % (ref 43–77)
Platelets: 178 10*3/uL (ref 150–400)
RBC: 4.35 MIL/uL (ref 4.22–5.81)
RDW: 11.5 % (ref 11.5–15.5)
WBC: 5.2 10*3/uL (ref 4.0–10.5)

## 2014-12-20 MED ORDER — OXYCODONE-ACETAMINOPHEN 5-325 MG PO TABS: 1.0000 | ORAL_TABLET | Freq: Four times a day (QID) | ORAL | Status: DC | PRN

## 2014-12-20 MED ORDER — PREDNISONE 50 MG PO TABS
60.0000 mg | ORAL_TABLET | Freq: Once | ORAL | Status: AC
  Administered 2014-12-20: 60 mg via ORAL
  Filled 2014-12-20 (×2): qty 1

## 2014-12-20 MED ORDER — PREDNISONE 20 MG PO TABS: ORAL_TABLET | ORAL | Status: DC
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
## 2014-12-20 NOTE — ED Notes (Signed)
Pt c/o right knee pain x 3 days HX gout

## 2014-12-20 NOTE — Discharge Instructions (Signed)

## 2015-03-16 ENCOUNTER — Emergency Department (HOSPITAL_COMMUNITY)
Admission: EM | Admit: 2015-03-16 | Discharge: 2015-03-17 | Disposition: A | Discharge status: Home / Self Care | Payer: Self-pay | Attending: Emergency Medicine | Admitting: Emergency Medicine

## 2015-03-16 DIAGNOSIS — M10072 Idiopathic gout, left ankle and foot: Secondary | ICD-10-CM | POA: Insufficient documentation

## 2015-03-16 DIAGNOSIS — M1000 Idiopathic gout, unspecified site: Secondary | ICD-10-CM

## 2015-03-16 DIAGNOSIS — D509 Iron deficiency anemia, unspecified: Secondary | ICD-10-CM | POA: Insufficient documentation

## 2015-03-16 DIAGNOSIS — Z880 Allergy status to penicillin: Secondary | ICD-10-CM | POA: Insufficient documentation

## 2015-03-16 DIAGNOSIS — E669 Obesity, unspecified: Secondary | ICD-10-CM | POA: Insufficient documentation

## 2015-03-16 DIAGNOSIS — Z8719 Personal history of other diseases of the digestive system: Secondary | ICD-10-CM | POA: Insufficient documentation

## 2015-03-16 DIAGNOSIS — Z79899 Other long term (current) drug therapy: Secondary | ICD-10-CM | POA: Insufficient documentation

## 2015-03-16 NOTE — ED Notes (Signed)
Pt reports to ER from home c/o pain and swelling to left ankle that began 3 days ago; pt reports hx of gout for several years; pt denies relief from ibuprofen; pt took prescribed prednisone this AM; NAD noted. Pt states he is unable to ambulate on left ankle

## 2015-03-17 ENCOUNTER — Encounter (HOSPITAL_COMMUNITY): Payer: Self-pay | Admitting: Emergency Medicine

## 2015-03-17 MED ORDER — PREDNISONE 20 MG PO TABS: ORAL_TABLET | ORAL | Status: DC

## 2015-03-17 MED ORDER — INDOMETHACIN 50 MG PO CAPS: 50.0000 mg | ORAL_CAPSULE | Freq: Three times a day (TID) | ORAL | Status: DC

## 2015-03-17 MED ORDER — PREDNISONE 20 MG PO TABS
60.0000 mg | ORAL_TABLET | Freq: Once | ORAL | Status: AC
  Administered 2015-03-17: 60 mg via ORAL
  Filled 2015-03-17: qty 3

## 2015-03-17 MED ORDER — KETOROLAC TROMETHAMINE 60 MG/2ML IM SOLN
60.0000 mg | Freq: Once | INTRAMUSCULAR | Status: AC
  Administered 2015-03-17: 60 mg via INTRAMUSCULAR
  Filled 2015-03-17: qty 2

## 2015-03-17 NOTE — Discharge Instructions (Signed)
Low-Purine Diet °Purines are compounds that affect the level of uric acid in your body. A low-purine diet is a diet that is low in purines. Eating a low-purine diet can prevent the level of uric acid in your body from getting too high and causing gout or kidney stones or both. °WHAT DO I NEED TO KNOW ABOUT THIS DIET? °· Choose low-purine foods. Examples of low-purine foods are listed in the next section. °· Drink plenty of fluids, especially water. Fluids can help remove uric acid from your body. Try to drink 8-16 cups (1.9-3.8 L) a day. °· Limit foods high in fat, especially saturated fat, as fat makes it harder for the body to get rid of uric acid. Foods high in saturated fat include pizza, cheese, ice cream, whole milk, fried foods, and gravies. Choose foods that are lower in fat and lean sources of protein. Use olive oil when cooking as it contains healthy fats that are not high in saturated fat. °· Limit alcohol. Alcohol interferes with the elimination of uric acid from your body. If you are having a gout attack, avoid all alcohol. °· Keep in mind that different people's bodies react differently to different foods. You will probably learn over time which foods do or do not affect you. If you discover that a food tends to cause your gout to flare up, avoid eating that food. You can more freely enjoy foods that do not cause problems. If you have any questions about a food item, talk to your dietitian or health care provider. °WHICH FOODS ARE LOW, MODERATE, AND HIGH IN PURINES? °The following is a list of foods that are low, moderate, and high in purines. You can eat any amount of the foods that are low in purines. You may be able to have small amounts of foods that are moderate in purines. Ask your health care provider how much of a food moderate in purines you can have. Avoid foods high in purines. °Grains °· Foods low in purines: Enriched white bread, pasta, rice, cake, cornbread, popcorn. °· Foods moderate in  purines: Whole-grain breads and cereals, wheat germ, bran, oatmeal. Uncooked oatmeal. Dry wheat bran or wheat germ. °· Foods high in purines: Pancakes, French toast, biscuits, muffins. °Vegetables °· Foods low in purines: All vegetables, except those that are moderate in purines. °· Foods moderate in purines: Asparagus, cauliflower, spinach, mushrooms, green peas. °Fruits °· All fruits are low in purines. °Meats and other Protein Foods °· Foods low in purines: Eggs, nuts, peanut butter. °· Foods moderate in purines: 80-90% lean beef, lamb, veal, pork, poultry, fish, eggs, peanut butter, nuts. Crab, lobster, oysters, and shrimp. Cooked dried beans, peas, and lentils. °· Foods high in purines: Anchovies, sardines, herring, mussels, tuna, codfish, scallops, trout, and haddock. Bacon. Organ meats (such as liver or kidney). Tripe. Game meat. Goose. Sweetbreads. °Dairy °· All dairy foods are low in purines. Low-fat and fat-free dairy products are best because they are low in saturated fat. °Beverages °· Drinks low in purines: Water, carbonated beverages, tea, coffee, cocoa. °· Drinks moderate in purines: Soft drinks and other drinks sweetened with high-fructose corn syrup. Juices. To find whether a food or drink is sweetened with high-fructose corn syrup, look at the ingredients list. °· Drinks high in purines: Alcoholic beverages (such as beer). °Condiments °· Foods low in purines: Salt, herbs, olives, pickles, relishes, vinegar. °· Foods moderate in purines: Butter, margarine, oils, mayonnaise. °Fats and Oils °· Foods low in purines: All types, except gravies   and sauces made with meat.  Foods high in purines: Gravies and sauces made with meat. Other Foods  Foods low in purines: Sugars, sweets, gelatin. Cake. Soups made without meat.  Foods moderate in purines: Meat-based or fish-based soups, broths, or bouillons. Foods and drinks sweetened with high-fructose corn syrup.  Foods high in purines: High-fat desserts  (such as ice cream, cookies, cakes, pies, doughnuts, and chocolate). Contact your dietitian for more information on foods that are not listed here.   This information is not intended to replace advice given to you by your health care provider. Make sure you discuss any questions you have with your health care provider.   Document Released: 09/20/2010 Document Revised: 05/31/2013 Document Reviewed: 05/02/2013 Elsevier Interactive Patient Education [employer]  Emergency Department Resource Guide 1) Find a Doctor and Pay Out of Pocket Although you won't have to find out who is covered by your insurance plan, it is a good idea to ask around and get recommendations. You will then need to call the office and see if the doctor you have chosen will accept you as a new patient and what types of options they offer for patients who are self-pay. Some doctors offer discounts or will set up payment plans for their patients who do not have insurance, but you will need to ask so you aren't surprised when you get to your appointment.  2) Contact Your Local Health Department Not all health departments have doctors that can see patients for sick visits, but many do, so it is worth a call to see if yours does. If you don't know where your local health department is, you can check in your phone book. The CDC also has a tool to help you locate your state's health department, and many state websites also have listings of all of their local health departments.  3) Find a Walk-in Clinic If your illness is not likely to be very severe or complicated, you may want to try a walk in clinic. These are popping up all over the country in pharmacies, drugstores, and shopping centers. They're usually staffed by nurse practitioners or physician assistants that have been trained to treat common illnesses and complaints. They're usually fairly quick and inexpensive. However, if you have serious medical issues or chronic  medical problems, these are probably not your best option.  No Primary Care Doctor: - Call Health Connect at  [phone] - they can help you locate a primary care doctor that  accepts your insurance, provides certain services, etc. - Physician Referral Service- [phone]  Chronic Pain Problems: Organization         Address  Phone   Notes  [facility]  [phone] Patients need to be referred by their primary care doctor.   Medication Assistance: Organization         Address  Phone   Notes  [facility] Medication [facility] [address] [place] [place], [place] [address] [phone] --Must be a resident of [facility] -- Must have NO insurance coverage whatsoever (no Medicaid/ Medicare, etc.) -- The pt. MUST have a primary care doctor that directs their care regularly and follows them in the community   [facility]ist  [phone]   [employer]  [phone]    Agencies that provide inexpensive medical care: Organization         Address  Phone   Notes  [facility]  [phone]   [facility]    [phone]) [phone]   [facility] [address] [place], [place] [address] [phone]   [facility] [place] [address], [place] [phone]) [phone]   [facility]anned Parenthood    [phone]) [phone]   [facility]    [phone]   [facility]  [address], [place] Phone:  [phone], Fax:  [phone](336) [phone] Hours of Operation:  9 am - 6 pm, M-F.  Also accepts Medicaid/Medicare and self-pay.  [facility]  [address], [place] Phone: [phone], Fax: [phone]. Hours of Operation:  8:30 am - 5:30 pm, M-F.  Also accepts Medicaid and self-pay.  [facility] [place] [address], [place] Phone: [phone]) [phone]   [facility] [address], [place], [place] [phone],  Ext. [phone] Mondays & Thursdays: 7-9 AM.  First 15 patients are seen on a first come, first serve basis.    Medicaid-accepting [facility] Providers:  Organization         Address  Phone   Notes  [facility] [address], [place] [phone] Also accepts self-pay patients.  [facility] [address], [place]  [phone]) [phone]   [facility] [address], [place] [phone]) [phone]   [facility] [address] [place] [address], [place] [phone]) [phone]   Renaye RakersVeita Bland [address], [place]   [phone] Only accepts [place] Access [place] patients after they have their name applied to their card.   Self-Pay (no insurance) in [facility]:  Organization         Address  Phone   Notes  Sickle Cell Patients, [facility] [address], [place] [phone]   [facility] [address], [place] [phone]) [phone]   [facility] [place]  [address], [place] [phone]   [facility]/Dr. Osei-Bonsu  [address] [place] [address], [place] or [phone], [place] [phone] Phone number for both [place] and [place] locations is the same.  [facility] [address], [place] [phone]   [facility]  [address] [place] [address], [place] or [address] [phone]   [facility] [address], [place] [phone], phone; [phone]) [phone], fax Sees patients 1st and 3rd Saturday of every month.  Must not qualify for public or private insurance (i.e. Medicaid, Medicare, [place] Health Choice, Veterans' Benefits)  Household income should be no more than 200% of the poverty level The clinic cannot treat you if you are pregnant or think you are pregnant  Sexually transmitted diseases are not  treated at the clinic.    Dental Care: Organization         Address  Phone  Notes  [facility] [address], [place] [phone] Accepts children up to age 41 who are enrolled in [place] or [place] Health Choice; pregnant women with a Medicaid card; and children who have applied for Medicaid or  Health Choice, but were declined, whose parents can pay a reduced fee at time of service.  [facility] [place]  [address], [place] (  [phone] Accepts children up to age 4 who are enrolled in Medicaid or [place] Health Choice; pregnant women with a Medicaid card; and children who have applied for Medicaid or [place] Health Choice, but were declined, whose parents can pay a reduced fee at time of service.  [facility]  [address] [place], [place] [phone] Patients are seen by appointment only. Walk-ins are not accepted. [facility] will see patients 30 years of age and older. Monday - Tuesday (8am-5pm) Most Wednesdays (8:30-5pm) $30 per visit, cash only  [facility]  [address], [place] [facility] [phone] Patients are seen by appointment only. Walk-ins are not accepted. [facility] will see patients 75 years of age and older. One Wednesday Evening (Monthly: Volunteer Based).  $30 per visit, cash only  [employer]  [phone] for adults; Children under age 57, call Graduate Pediatric Dentistry at [phone]. Children aged 47-14, please call [phone] to request a pediatric application.  Dental services are provided in all areas of dental care including fillings, crowns and bridges, complete and partial dentures, implants, gum treatment, root canals, and extractions. Preventive care is also provided. Treatment is provided to both adults and children. Patients are selected via a lottery and there is  often a waiting list.   [facility] [address], [place]  [phone] [email or web address]   [facility] [address] [place], [place] [phone], Ext. [phone] Second and Fourth Thursday of each month, opens at 6:30 AM; Clinic ends at 9 AM.  Patients are seen on a first-come first-served basis, and a limited number are seen during each clinic.   [facility]  [address] [place] [place], [place] [phone]   Eligibility Requirements You must have lived in [place], [place], or [place] counties for at least the last three months.   You cannot be eligible for state or federal sponsored [place], including [employer], [place], or [employer].   You generally cannot be eligible for healthcare insurance through your employer.    How to apply: Eligibility screenings are held every Tuesday and Wednesday afternoon from 1:00 pm until 4:00 pm. You do not need an appointment for the interview!  [facility] [address] [place], [place] [phone]   [facility]  [phone]   [facility] Department  [phone]   [facility] Health Department  [phone]    Behavioral Health Resources in the Community: Intensive Outpatient Programs Organization         Address  Phone  Notes  [facility] [address], [place], [place] [phone]   [facility] [address], [place], [place] [phone]   [facility] [address], [place], [place]  [phone]   [facility] [address],  [place], [place] [phone] or [phone]   Substance Abuse Resources Organization         Address  Phone  Notes  Al[facility] Drug Services  [phone]   [facility]  [phone]   [facility] [place]  [phone]   Floydene Flock  [phone]   Residential & Outpatient Substance  Abuse Program  [phone]   Psychological Services Organization         Address  Phone  Notes  [facility]  [phone]   [facility]  [phone]  [place] [address], [place] or [phone]    Mobile Crisis Teams Organization         Address  Phone  Notes  [facility], Mobile Crisis Care Unit  [phone]   [facility]  [address] [place], [place]   [place] Levels [address], [place] [phone]    Self-Help/Support Groups Organization         Address  Phone             Notes  [place]. of [place] - variety of support groups  [place] Call for more information  Narcotics Anonymous (NA), [facility] [address], [employer] [place]  2 meetings at this location   Special educational needs teacher         Address  Phone  Notes  [facility] [place],    [place]  [phone]   [facility]  [address] [place] [id number], [place], [place]   [place], [place] [phone] Admissions: 8am-3pm M-F  Incentives Substance [place] [address]    [place], [place] [phone]   [facility] [address] [place], [place], [place]   [facility] [address]  [place], [place]   [facility] - Intensive Outpatient [address], [place], [place]   [facility] ([place].) [place].,  [place], [place] [phone] or [phone]   [facility] ([facility]) [address] [employer], [place] Accepts Medicaid  [facility] [place] [address],  [place] [phone] Substance Abuse/Addiction Treatment   [facility] Organization          Address  Phone  Notes  [facility]  [phone]   Domenic Schwab, PhD [address] [place], [place]   [phone] or [phone]   [place], [place] [phone]   [facility] [address], [place], [place] [phone] Insurance/Medicaid/sponsorship through [facility] and Families [address] [place]                                    [place], [place] [phone] [place] [address] [place], [place] [phone]    Dr. Adele Schilder  [phone]   [facility] Dept. 1) [address] [place] 2) [place] 3)  [place] [address], [place] [phone]  [phone]   [place] [phone] or [phone] (After Hours)

## 2015-03-17 NOTE — ED Notes (Signed)
When pt was called from lobby, pt was seen getting into vehicle
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
## 2016-02-08 ENCOUNTER — Telehealth: Payer: Self-pay | Admitting: *Deleted

## 2016-02-08 ENCOUNTER — Emergency Department (HOSPITAL_COMMUNITY)
Admission: EM | Admit: 2016-02-08 | Discharge: 2016-02-08 | Disposition: A | Discharge status: Home / Self Care | Payer: Self-pay | Attending: Emergency Medicine | Admitting: Emergency Medicine

## 2016-02-08 ENCOUNTER — Encounter (HOSPITAL_COMMUNITY): Payer: Self-pay | Admitting: Emergency Medicine

## 2016-02-08 DIAGNOSIS — M109 Gout, unspecified: Secondary | ICD-10-CM

## 2016-02-08 DIAGNOSIS — M10061 Idiopathic gout, right knee: Secondary | ICD-10-CM | POA: Insufficient documentation

## 2016-02-08 DIAGNOSIS — D649 Anemia, unspecified: Secondary | ICD-10-CM | POA: Insufficient documentation

## 2016-02-08 DIAGNOSIS — Z7982 Long term (current) use of aspirin: Secondary | ICD-10-CM | POA: Insufficient documentation

## 2016-02-08 LAB — CBC WITH DIFFERENTIAL/PLATELET
Basophils Absolute: 0 10*3/uL (ref 0.0–0.1)
Basophils Relative: 0 %
Eosinophils Absolute: 0.1 10*3/uL (ref 0.0–0.7)
Eosinophils Relative: 1 %
HCT: 30.2 % — ABNORMAL LOW (ref 39.0–52.0)
Hemoglobin: 8.2 g/dL — ABNORMAL LOW (ref 13.0–17.0)
Lymphocytes Relative: 24 %
Lymphs Abs: 1.8 10*3/uL (ref 0.7–4.0)
MCH: 21 pg — ABNORMAL LOW (ref 26.0–34.0)
MCHC: 27.2 g/dL — ABNORMAL LOW (ref 30.0–36.0)
MCV: 77.4 fL — ABNORMAL LOW (ref 78.0–100.0)
Monocytes Absolute: 0.8 10*3/uL (ref 0.1–1.0)
Monocytes Relative: 10 %
Neutro Abs: 4.8 10*3/uL (ref 1.7–7.7)
Neutrophils Relative %: 65 %
Platelets: 360 10*3/uL (ref 150–400)
RBC: 3.9 MIL/uL — ABNORMAL LOW (ref 4.22–5.81)
RDW: 17.3 % — ABNORMAL HIGH (ref 11.5–15.5)
WBC: 7.5 10*3/uL (ref 4.0–10.5)

## 2016-02-08 LAB — BASIC METABOLIC PANEL
Anion gap: 6 (ref 5–15)
BUN: 10 mg/dL (ref 6–20)
CO2: 24 mmol/L (ref 22–32)
Calcium: 9.1 mg/dL (ref 8.9–10.3)
Chloride: 106 mmol/L (ref 101–111)
Creatinine, Ser: 0.92 mg/dL (ref 0.61–1.24)
GFR calc Af Amer: >60 mL/min (ref >60)
GFR calc non Af Amer: >60 mL/min (ref >60)
Glucose, Bld: 189 mg/dL — ABNORMAL HIGH (ref 65–99)
Potassium: 3.8 mmol/L (ref 3.5–5.1)
Sodium: 136 mmol/L (ref 135–145)

## 2016-02-08 MED ORDER — FERROUS SULFATE 325 (65 FE) MG PO TABS: 325.0000 mg | ORAL_TABLET | Freq: Every day | ORAL | 0 refills | Status: DC

## 2016-02-08 MED ORDER — PREDNISONE 10 MG PO TABS: 40.0000 mg | ORAL_TABLET | Freq: Every day | ORAL | 0 refills | Status: DC

## 2016-02-08 NOTE — ED Notes (Signed)
Bed: WA02 Expected date:  Expected time:  Means of arrival:  Comments: 

## 2016-02-08 NOTE — ED Triage Notes (Signed)
Patient is complaining of right knee pain  Since Tuesday. Patient has hx of gout. Patient states does not have a regular doctor. He also says he is having bloody stools since 10 years ago .
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
## 2016-02-11 ENCOUNTER — Telehealth: Payer: Self-pay | Admitting: *Deleted

## 2016-10-28 ENCOUNTER — Encounter (HOSPITAL_COMMUNITY): Payer: Self-pay | Admitting: Emergency Medicine

## 2016-10-28 ENCOUNTER — Emergency Department (HOSPITAL_COMMUNITY)
Admission: EM | Admit: 2016-10-28 | Discharge: 2016-10-28 | Disposition: A | Discharge status: Home / Self Care | Payer: Self-pay | Attending: Emergency Medicine | Admitting: Emergency Medicine

## 2016-10-28 DIAGNOSIS — M109 Gout, unspecified: Secondary | ICD-10-CM | POA: Insufficient documentation

## 2016-10-28 DIAGNOSIS — Z79899 Other long term (current) drug therapy: Secondary | ICD-10-CM | POA: Insufficient documentation

## 2016-10-28 DIAGNOSIS — Z7982 Long term (current) use of aspirin: Secondary | ICD-10-CM | POA: Insufficient documentation

## 2016-10-28 MED ORDER — PREDNISONE 20 MG PO TABS
60.0000 mg | ORAL_TABLET | Freq: Once | ORAL | Status: AC
  Administered 2016-10-28: 60 mg via ORAL
  Filled 2016-10-28: qty 3

## 2016-10-28 MED ORDER — PREDNISONE 20 MG PO TABS: 40.0000 mg | ORAL_TABLET | Freq: Every day | ORAL | 0 refills | Status: AC

## 2016-10-28 NOTE — ED Notes (Signed)
Pt from home with complaints of left knee pain that he states is similar to his typical gout pain. Pt states his pain began last night. Pt states it is 9/10 and states he is unable to bear weight.
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
## 2016-11-04 ENCOUNTER — Encounter (HOSPITAL_COMMUNITY): Payer: Self-pay | Admitting: Emergency Medicine

## 2016-11-04 ENCOUNTER — Emergency Department (HOSPITAL_COMMUNITY): Payer: Self-pay

## 2016-11-04 ENCOUNTER — Emergency Department (HOSPITAL_COMMUNITY)
Admission: EM | Admit: 2016-11-04 | Discharge: 2016-11-04 | Disposition: A | Discharge status: Home / Self Care | Payer: Self-pay | Attending: Emergency Medicine | Admitting: Emergency Medicine

## 2016-11-04 DIAGNOSIS — Z7982 Long term (current) use of aspirin: Secondary | ICD-10-CM | POA: Insufficient documentation

## 2016-11-04 DIAGNOSIS — M109 Gout, unspecified: Secondary | ICD-10-CM | POA: Insufficient documentation

## 2016-11-04 DIAGNOSIS — Z79899 Other long term (current) drug therapy: Secondary | ICD-10-CM | POA: Insufficient documentation

## 2016-11-04 MED ORDER — INDOMETHACIN 25 MG PO CAPS: 50.0000 mg | ORAL_CAPSULE | Freq: Three times a day (TID) | ORAL | 0 refills | Status: AC

## 2016-11-04 NOTE — Discharge Instructions (Signed)
Take your medication as prescribed. I recommend following the diet listed below to help with your gout flare. Follow-up with the primary care provider's office below within the next week for follow-up evaluation and further management of your gout. Return to the emergency department if symptoms worsen or new onset of fever, redness, worsening swelling, calf swelling/tenderness, numbness, weakness, decreased range of motion, chest pain, shortness of breath.
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
## 2016-11-04 NOTE — ED Triage Notes (Signed)
patient states that he was seen here week ago for gout in left knee and out of medications and still having same symptoms.

## 2016-11-24 ENCOUNTER — Inpatient Hospital Stay: Payer: Self-pay

## 2016-11-24 NOTE — Progress Notes (Deleted)
Patient ID: [name], male   DOB: 04/19/1976, 41 y.o.   MRN: [id number] After being seen in the ED 5/22 and 2/29/2018 for gout of the L knee.  Initially he was treated with prednisone and subsequently indomethacin.

## 2016-11-26 ENCOUNTER — Ambulatory Visit: Payer: Self-pay | Attending: Internal Medicine | Admitting: Physician Assistant

## 2016-11-26 VITALS — BP 135/95 | HR 94 | Temp 98.0°F | Resp 16 | Wt 327.6 lb

## 2016-11-26 DIAGNOSIS — M25461 Effusion, right knee: Secondary | ICD-10-CM | POA: Insufficient documentation

## 2016-11-26 DIAGNOSIS — M109 Gout, unspecified: Secondary | ICD-10-CM | POA: Insufficient documentation

## 2016-11-26 DIAGNOSIS — E538 Deficiency of other specified B group vitamins: Secondary | ICD-10-CM | POA: Insufficient documentation

## 2016-11-26 DIAGNOSIS — Z8614 Personal history of Methicillin resistant Staphylococcus aureus infection: Secondary | ICD-10-CM | POA: Insufficient documentation

## 2016-11-26 DIAGNOSIS — Z882 Allergy status to sulfonamides status: Secondary | ICD-10-CM | POA: Insufficient documentation

## 2016-11-26 DIAGNOSIS — Z7982 Long term (current) use of aspirin: Secondary | ICD-10-CM | POA: Insufficient documentation

## 2016-11-26 DIAGNOSIS — M25562 Pain in left knee: Secondary | ICD-10-CM | POA: Insufficient documentation

## 2016-11-26 DIAGNOSIS — K648 Other hemorrhoids: Secondary | ICD-10-CM | POA: Insufficient documentation

## 2016-11-26 DIAGNOSIS — Z880 Allergy status to penicillin: Secondary | ICD-10-CM | POA: Insufficient documentation

## 2016-11-26 DIAGNOSIS — R030 Elevated blood-pressure reading, without diagnosis of hypertension: Secondary | ICD-10-CM | POA: Insufficient documentation

## 2016-11-26 DIAGNOSIS — M25572 Pain in left ankle and joints of left foot: Secondary | ICD-10-CM | POA: Insufficient documentation

## 2016-11-26 DIAGNOSIS — E669 Obesity, unspecified: Secondary | ICD-10-CM | POA: Insufficient documentation

## 2016-11-26 MED ORDER — METHYLPREDNISOLONE 4 MG PO TBPK: ORAL_TABLET | ORAL | 0 refills | Status: DC
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
## 2016-11-27 LAB — URIC ACID: Uric Acid: 7.9 mg/dL (ref 3.7–8.6)

## 2016-12-05 ENCOUNTER — Telehealth: Payer: Self-pay

## 2016-12-05 NOTE — Telephone Encounter (Signed)
Contacted pt to go over lab results pt is aware of results and doesn't have any questions or concerns 

## 2016-12-13 ENCOUNTER — Emergency Department (HOSPITAL_COMMUNITY)
Admission: EM | Admit: 2016-12-13 | Discharge: 2016-12-13 | Disposition: A | Discharge status: Home / Self Care | Payer: Self-pay | Attending: Emergency Medicine | Admitting: Emergency Medicine

## 2016-12-13 ENCOUNTER — Encounter (HOSPITAL_COMMUNITY): Payer: Self-pay | Admitting: *Deleted

## 2016-12-13 DIAGNOSIS — M1A0620 Idiopathic chronic gout, left knee, without tophus (tophi): Secondary | ICD-10-CM | POA: Insufficient documentation

## 2016-12-13 DIAGNOSIS — D649 Anemia, unspecified: Secondary | ICD-10-CM | POA: Insufficient documentation

## 2016-12-13 DIAGNOSIS — M109 Gout, unspecified: Secondary | ICD-10-CM

## 2016-12-13 MED ORDER — COLCHICINE 0.6 MG PO TABS: 0.6000 mg | ORAL_TABLET | Freq: Every day | ORAL | 0 refills | Status: DC

## 2016-12-13 MED ORDER — COLCHICINE 0.6 MG PO TABS
1.2000 mg | ORAL_TABLET | Freq: Once | ORAL | Status: AC
  Administered 2016-12-13: 1.2 mg via ORAL
  Filled 2016-12-13: qty 2

## 2016-12-13 MED ORDER — IBUPROFEN 200 MG PO TABS
600.0000 mg | ORAL_TABLET | Freq: Once | ORAL | Status: AC
  Administered 2016-12-13: 600 mg via ORAL
  Filled 2016-12-13: qty 3

## 2016-12-13 MED ORDER — HYDROCODONE-ACETAMINOPHEN 5-325 MG PO TABS: 1.0000 | ORAL_TABLET | ORAL | 0 refills | Status: DC | PRN

## 2016-12-13 MED ORDER — OXYCODONE-ACETAMINOPHEN 5-325 MG PO TABS
2.0000 | ORAL_TABLET | Freq: Once | ORAL | Status: DC
  Filled 2016-12-13: qty 2

## 2016-12-13 MED ORDER — PREDNISONE 20 MG PO TABS
40.0000 mg | ORAL_TABLET | Freq: Once | ORAL | Status: AC
  Administered 2016-12-13: 40 mg via ORAL
  Filled 2016-12-13: qty 2

## 2016-12-13 MED ORDER — PREDNISONE 20 MG PO TABS: 40.0000 mg | ORAL_TABLET | Freq: Every day | ORAL | 0 refills | Status: DC

## 2016-12-13 MED ORDER — IBUPROFEN 600 MG PO TABS: 600.0000 mg | ORAL_TABLET | Freq: Four times a day (QID) | ORAL | 0 refills | Status: DC | PRN

## 2016-12-13 NOTE — ED Triage Notes (Signed)
Patient is alert and oriented x4.  Patient states that he is having posterior left knee pain that started last night.  Patient has Hx of gout and had this same pain 2 weeks ago.  Currently patient rates his pain 10 of 10.

## 2016-12-13 NOTE — ED Notes (Signed)
Patient is alert and oriented x3.  He was given DC instructions and follow up visit instructions.  Patient gave verbal understanding.  He was DC ambulatory under his own power to home.  V/S stable.  He was not showing any signs of distress on DC 

## 2016-12-13 NOTE — Discharge Instructions (Signed)
Follow-up with [facility] as previously scheduled. Try to limit alcohol and meat consumption. You don't have to stop but be mindful that consumption (particularly alcohol) can precipitate a gout flare. Do not take aspirin for pain reasons because this can actually aggravate it more. NSAIDs like ibuprofen, aleve, naprosyn, etc are fine to take.
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
## 2017-01-02 ENCOUNTER — Ambulatory Visit: Payer: Self-pay | Attending: Internal Medicine | Admitting: Internal Medicine

## 2017-01-02 ENCOUNTER — Encounter: Payer: Self-pay | Admitting: Internal Medicine

## 2017-01-02 VITALS — BP 150/80 | HR 92 | Temp 98.3°F | Resp 16 | Wt 326.2 lb

## 2017-01-02 DIAGNOSIS — K625 Hemorrhage of anus and rectum: Secondary | ICD-10-CM

## 2017-01-02 DIAGNOSIS — Z9889 Other specified postprocedural states: Secondary | ICD-10-CM | POA: Insufficient documentation

## 2017-01-02 DIAGNOSIS — E538 Deficiency of other specified B group vitamins: Secondary | ICD-10-CM | POA: Insufficient documentation

## 2017-01-02 DIAGNOSIS — D500 Iron deficiency anemia secondary to blood loss (chronic): Secondary | ICD-10-CM | POA: Insufficient documentation

## 2017-01-02 DIAGNOSIS — M109 Gout, unspecified: Secondary | ICD-10-CM | POA: Insufficient documentation

## 2017-01-02 DIAGNOSIS — M25461 Effusion, right knee: Secondary | ICD-10-CM | POA: Insufficient documentation

## 2017-01-02 DIAGNOSIS — Z809 Family history of malignant neoplasm, unspecified: Secondary | ICD-10-CM | POA: Insufficient documentation

## 2017-01-02 DIAGNOSIS — Z8739 Personal history of other diseases of the musculoskeletal system and connective tissue: Secondary | ICD-10-CM

## 2017-01-02 DIAGNOSIS — I10 Essential (primary) hypertension: Secondary | ICD-10-CM | POA: Insufficient documentation

## 2017-01-02 DIAGNOSIS — K5900 Constipation, unspecified: Secondary | ICD-10-CM | POA: Insufficient documentation

## 2017-01-02 DIAGNOSIS — F1010 Alcohol abuse, uncomplicated: Secondary | ICD-10-CM | POA: Insufficient documentation

## 2017-01-02 DIAGNOSIS — K922 Gastrointestinal hemorrhage, unspecified: Secondary | ICD-10-CM | POA: Insufficient documentation

## 2017-01-02 DIAGNOSIS — IMO0002 Reserved for concepts with insufficient information to code with codable children: Secondary | ICD-10-CM

## 2017-01-02 DIAGNOSIS — F1099 Alcohol use, unspecified with unspecified alcohol-induced disorder: Secondary | ICD-10-CM

## 2017-01-02 DIAGNOSIS — Z880 Allergy status to penicillin: Secondary | ICD-10-CM | POA: Insufficient documentation

## 2017-01-02 DIAGNOSIS — Z7982 Long term (current) use of aspirin: Secondary | ICD-10-CM | POA: Insufficient documentation

## 2017-01-02 DIAGNOSIS — K648 Other hemorrhoids: Secondary | ICD-10-CM | POA: Insufficient documentation

## 2017-01-02 DIAGNOSIS — E669 Obesity, unspecified: Secondary | ICD-10-CM | POA: Insufficient documentation

## 2017-01-02 DIAGNOSIS — Z882 Allergy status to sulfonamides status: Secondary | ICD-10-CM | POA: Insufficient documentation

## 2017-01-02 DIAGNOSIS — Z888 Allergy status to other drugs, medicaments and biological substances status: Secondary | ICD-10-CM | POA: Insufficient documentation

## 2017-01-02 HISTORY — DX: Personal history of other diseases of the musculoskeletal system and connective tissue: Z87.39

## 2017-01-02 MED ORDER — PREDNISONE 20 MG PO TABS: ORAL_TABLET | ORAL | 0 refills | Status: DC

## 2017-01-02 MED ORDER — FERROUS SULFATE 325 (65 FE) MG PO TABS: 325.0000 mg | ORAL_TABLET | Freq: Every day | ORAL | 0 refills | Status: DC

## 2017-01-02 MED ORDER — HYDROCORTISONE ACETATE 25 MG RE SUPP: 25.0000 mg | Freq: Two times a day (BID) | RECTAL | 0 refills | Status: DC | PRN

## 2017-01-02 MED ORDER — AMLODIPINE BESYLATE 5 MG PO TABS: 5.0000 mg | ORAL_TABLET | Freq: Every day | ORAL | 3 refills | Status: DC

## 2017-01-02 MED ORDER — COLCHICINE 0.6 MG PO TABS: 0.6000 mg | ORAL_TABLET | Freq: Every day | ORAL | 2 refills | Status: DC

## 2017-01-02 MED FILL — predniSONE 20 MG TABS: 20 | 12 days supply | Qty: 15 | Fill #0

## 2017-01-02 MED FILL — AMLODIPINE BESYLATE 5 MG TA: 5 | 30 days supply | Qty: 30 | Fill #0

## 2017-01-02 MED FILL — COLCHICINE 0.6 MG TABLET: 0.6 | 30 days supply | Qty: 30 | Fill #0

## 2017-01-02 MED FILL — FERROUS SULFATE 325 MG TAB: 325 (65 FE) | 30 days supply | Qty: 30 | Fill #0
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
## 2017-01-02 NOTE — Patient Instructions (Signed)
Complete prednisone taper. Take colchicine daily as prescribed.  Please [facility] and [facility].  Do sitz baths 2-3 times a week as instructed.  Use Anusol suppository as ned for rectal bleeding.    Decrease salt intake. Start Norvasc for blood pressure.  Consider going to [employer] meetings.

## 2017-01-03 ENCOUNTER — Other Ambulatory Visit: Payer: Self-pay | Admitting: Internal Medicine

## 2017-01-03 LAB — IRON,TIBC AND FERRITIN PANEL
Ferritin: 17 ng/mL — ABNORMAL LOW (ref 30–400)
Iron Saturation: 9 % — CL (ref 15–55)
Iron: 44 ug/dL (ref 38–169)
Total Iron Binding Capacity: 481 ug/dL — ABNORMAL HIGH (ref 250–450)
UIBC: 437 ug/dL — ABNORMAL HIGH (ref 111–343)

## 2017-01-03 LAB — URIC ACID: Uric Acid: 10.1 mg/dL — ABNORMAL HIGH (ref 3.7–8.6)

## 2017-01-03 LAB — CBC
Hematocrit: 37.3 % — ABNORMAL LOW (ref 37.5–51.0)
Hemoglobin: 12.5 g/dL — ABNORMAL LOW (ref 13.0–17.7)
MCH: 29.5 pg (ref 26.6–33.0)
MCHC: 33.5 g/dL (ref 31.5–35.7)
MCV: 88 fL (ref 79–97)
Platelets: 205 10*3/uL (ref 150–379)
RBC: 4.24 x10E6/uL (ref 4.14–5.80)
RDW: 13.2 % (ref 12.3–15.4)
WBC: 4.1 10*3/uL (ref 3.4–10.8)

## 2017-01-03 LAB — COMPREHENSIVE METABOLIC PANEL
ALT: 18 IU/L (ref 0–44)
AST: 19 IU/L (ref 0–40)
Albumin/Globulin Ratio: 1.6 (ref 1.2–2.2)
Albumin: 4.4 g/dL (ref 3.5–5.5)
Alkaline Phosphatase: 60 IU/L (ref 39–117)
BUN/Creatinine Ratio: 15 (ref 9–20)
BUN: 13 mg/dL (ref 6–24)
Bilirubin Total: 0.4 mg/dL (ref 0.0–1.2)
CO2: 24 mmol/L (ref 20–29)
Calcium: 9.4 mg/dL (ref 8.7–10.2)
Chloride: 104 mmol/L (ref 96–106)
Creatinine, Ser: 0.87 mg/dL (ref 0.76–1.27)
GFR calc Af Amer: 123 mL/min/{1.73_m2} (ref >59)
GFR calc non Af Amer: 106 mL/min/{1.73_m2} (ref >59)
Globulin, Total: 2.8 g/dL (ref 1.5–4.5)
Glucose: 118 mg/dL — ABNORMAL HIGH (ref 65–99)
Potassium: 4.1 mmol/L (ref 3.5–5.2)
Sodium: 143 mmol/L (ref 134–144)
Total Protein: 7.2 g/dL (ref 6.0–8.5)

## 2017-01-03 MED ORDER — ALLOPURINOL 100 MG PO TABS: 100.0000 mg | ORAL_TABLET | Freq: Every day | ORAL | 1 refills | Status: DC

## 2017-01-12 ENCOUNTER — Telehealth: Payer: Self-pay

## 2017-01-12 NOTE — Telephone Encounter (Signed)
Contacted pt to go over lab results pt didn't answer lvm asking pt to give me a call at his earliest convenience  

## 2017-01-19 ENCOUNTER — Telehealth: Payer: Self-pay | Admitting: Internal Medicine

## 2017-01-19 NOTE — Telephone Encounter (Signed)
Patient returned nurse's call regarding his lab results. Pt gave a verbal ok to leave a detailed message at [phone], if he doesn't pick up.

## 2017-01-27 MED FILL — FERROUS SULFATE 325 MG TAB: 325 (65 FE) | 30 days supply | Qty: 30 | Fill #1

## 2017-01-27 MED FILL — COLCHICINE 0.6 MG TABLET: 0.6 | 30 days supply | Qty: 30 | Fill #1

## 2017-01-27 MED FILL — AMLODIPINE BESYLATE 5 MG TA: 5 | 30 days supply | Qty: 30 | Fill #1

## 2017-02-03 ENCOUNTER — Ambulatory Visit: Payer: Self-pay | Attending: Internal Medicine | Admitting: Internal Medicine

## 2017-02-03 ENCOUNTER — Encounter: Payer: Self-pay | Admitting: Internal Medicine

## 2017-02-03 VITALS — BP 136/86 | HR 89 | Temp 98.7°F | Resp 18 | Ht 70.0 in | Wt 327.6 lb

## 2017-02-03 DIAGNOSIS — I10 Essential (primary) hypertension: Secondary | ICD-10-CM | POA: Insufficient documentation

## 2017-02-03 DIAGNOSIS — K625 Hemorrhage of anus and rectum: Secondary | ICD-10-CM | POA: Insufficient documentation

## 2017-02-03 DIAGNOSIS — Z7982 Long term (current) use of aspirin: Secondary | ICD-10-CM | POA: Insufficient documentation

## 2017-02-03 DIAGNOSIS — E538 Deficiency of other specified B group vitamins: Secondary | ICD-10-CM | POA: Insufficient documentation

## 2017-02-03 DIAGNOSIS — D500 Iron deficiency anemia secondary to blood loss (chronic): Secondary | ICD-10-CM | POA: Insufficient documentation

## 2017-02-03 DIAGNOSIS — Z882 Allergy status to sulfonamides status: Secondary | ICD-10-CM | POA: Insufficient documentation

## 2017-02-03 DIAGNOSIS — Z23 Encounter for immunization: Secondary | ICD-10-CM

## 2017-02-03 DIAGNOSIS — Z881 Allergy status to other antibiotic agents status: Secondary | ICD-10-CM | POA: Insufficient documentation

## 2017-02-03 DIAGNOSIS — Z86010 Personal history of colonic polyps: Secondary | ICD-10-CM | POA: Insufficient documentation

## 2017-02-03 DIAGNOSIS — M109 Gout, unspecified: Secondary | ICD-10-CM | POA: Insufficient documentation

## 2017-02-03 DIAGNOSIS — Z79899 Other long term (current) drug therapy: Secondary | ICD-10-CM | POA: Insufficient documentation

## 2017-02-03 DIAGNOSIS — Z6841 Body Mass Index (BMI) 40.0 and over, adult: Secondary | ICD-10-CM | POA: Insufficient documentation

## 2017-02-03 DIAGNOSIS — E669 Obesity, unspecified: Secondary | ICD-10-CM | POA: Insufficient documentation

## 2017-02-03 DIAGNOSIS — Z8739 Personal history of other diseases of the musculoskeletal system and connective tissue: Secondary | ICD-10-CM

## 2017-02-03 DIAGNOSIS — F1010 Alcohol abuse, uncomplicated: Secondary | ICD-10-CM | POA: Insufficient documentation

## 2017-02-03 DIAGNOSIS — K648 Other hemorrhoids: Secondary | ICD-10-CM | POA: Insufficient documentation

## 2017-02-03 HISTORY — DX: Essential (primary) hypertension: I10

## 2017-02-03 MED ORDER — COLCHICINE 0.6 MG PO TABS: 0.6000 mg | ORAL_TABLET | Freq: Every day | ORAL | 4 refills | Status: DC

## 2017-02-03 MED ORDER — ALLOPURINOL 100 MG PO TABS: ORAL_TABLET | ORAL | 3 refills | Status: DC

## 2017-02-03 MED ORDER — FERROUS SULFATE 325 (65 FE) MG PO TABS: 325.0000 mg | ORAL_TABLET | Freq: Every day | ORAL | 1 refills | Status: DC

## 2017-02-03 MED ORDER — POLYETHYLENE GLYCOL 3350 17 GM/SCOOP PO POWD
17.0000 g | Freq: Two times a day (BID) | ORAL | 5 refills | Status: DC | PRN
Start: 2017-02-03 — End: 2017-07-23

## 2017-02-03 MED FILL — POLYETHYLENE GLYCOL 3350 PO: 8 days supply | Qty: 255 | Fill #0

## 2017-02-03 MED FILL — ALLOPURINOL 100 MG TABLET: 100 | 30 days supply | Qty: 60 | Fill #0

## 2017-02-03 NOTE — Patient Instructions (Signed)
Continue to take iron as prescribed.  Start Miralax to help keep bowel movement soft and regular.  I have referred you to a general surgeon for the rectal bleeding.  Continue Colchicine.  Start Allopurinol as discussed.  If you develop any rash while on this medication, please stop in and come in immediately.    Allopurinol tablets What is this medicine? ALLOPURINOL (al oh PURE i nole) reduces the amount of uric acid the body makes. It is used to treat the symptoms of gout. It is also used to treat or prevent high uric acid levels that occur as a result of certain types of chemotherapy. This medicine may also help patients who frequently have kidney stones. This medicine may be used for other purposes; ask your health care provider or pharmacist if you have questions. COMMON BRAND NAME(S): Zyloprim What should I tell my health care provider before I take this medicine? They need to know if you have any of these conditions: -kidney or liver disease -an unusual or allergic reaction to allopurinol, other medicines, foods, dyes, or preservatives -pregnant or trying to get pregnant -breast feeding How should I use this medicine? Take this medicine by mouth with a glass of water. Follow the directions on the prescription label. If this medicine upsets your stomach, take it with food or milk. Take your doses at regular intervals. Do not take your medicine more often than directed. Talk to your pediatrician regarding the use of this medicine in children. Special care may be needed. While this drug may be prescribed for children as young as 6 years for selected conditions, precautions do apply. Overdosage: If you think you have taken too much of this medicine contact a poison control center or emergency room at once. NOTE: This medicine is only for you. Do not share this medicine with others. What if I miss a dose? If you miss a dose, take it as soon as you can. If it is almost time for your next dose,  take only that dose. Do not take double or extra doses. What may interact with this medicine? Do not take this medicine with the following medication: -didanosine, ddI This medicine may also interact with the following medications: -amoxicillin or ampicillin -azathioprine -certain medicines used to treat gout -certain types of diuretics -chlorpropamide -cyclosporine -dicumarol -mercaptopurine -tolbutamide -warfarin This list may not describe all possible interactions. Give your health care provider a list of all the medicines, herbs, non-prescription drugs, or dietary supplements you use. Also tell them if you smoke, drink alcohol, or use illegal drugs. Some items may interact with your medicine. What should I watch for while using this medicine? Visit your doctor or health care professional for regular checks on your progress. If you are taking this medicine to treat gout, you may not have less frequent attacks at first. Keep taking your medicine regularly and the attacks should get better within 2 to 6 weeks. Drink plenty of water (10 to 12 full glasses a day) while you are taking this medicine. This will help to reduce stomach upset and reduce the risk of getting gout or kidney stones. Call your doctor or health care professional at once if you get a skin rash together with chills, fever, sore throat, or nausea and vomiting, if you have blood in your urine, or difficulty passing urine. Do not take vitamin C without asking your doctor or health care professional. Too much vitamin C can increase the chance of getting kidney stones. You may get  drowsy or dizzy. Do not drive, use machinery, or do anything that needs mental alertness until you know how this drug affects you. Do not stand or sit up quickly, especially if you are an older patient. This reduces the risk of dizzy or fainting spells. Alcohol can make you more drowsy and dizzy. Alcohol can also increase the chance of stomach problems and  increase the amount of uric acid in your blood. Avoid alcoholic drinks. What side effects may I notice from receiving this medicine? Side effects that you should report to your doctor or health care professional as soon as possible: -allergic reactions like skin rash, itching or hives, swelling of the face, lips, or tongue -breathing problems -muscle aches or pains -redness, blistering, peeling or loosening of the skin, including inside the mouth Side effects that usually do not require medical attention (report to your doctor or health care professional if they continue or are bothersome): -changes in taste -diarrhea -indigestion -stomach pain or cramps This list may not describe all possible side effects. Call your doctor for medical advice about side effects. You may report side effects to FDA at 1-800-FDA-1088. Where should I keep my medicine? Keep out of the reach of children. Store at room temperature between 15 and 25 degrees C (59 and 77 degrees F). Protect from light and moisture. Throw away any unused medicine after the expiration date. NOTE: This sheet is a summary. It may not cover all possible information. If you have questions about this medicine, talk to your doctor, pharmacist, or health care provider.  2018 Elsevier/Gold Standard (2007-11-29 14:26:54)
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
## 2017-02-23 ENCOUNTER — Other Ambulatory Visit: Payer: Self-pay | Admitting: Internal Medicine

## 2017-02-23 MED FILL — FERROUS SULFATE 325 MG TAB: 325 (65 FE) | 30 days supply | Qty: 30 | Fill #2

## 2017-02-23 MED FILL — ALLOPURINOL 100 MG TABLET: 100 | 30 days supply | Qty: 30 | Fill #0

## 2017-02-23 MED FILL — COLCHICINE 0.6 MG TABLET: 0.6 | 30 days supply | Qty: 30 | Fill #2

## 2017-02-25 MED FILL — AMLODIPINE BESYLATE 5 MG TA: 5 | 30 days supply | Qty: 30 | Fill #2

## 2017-03-25 MED FILL — COLCHICINE 0.6 MG TABS: 0.6 | 30 days supply | Qty: 30 | Fill #0

## 2017-03-25 MED FILL — ?ALLOPURINOL 100MG TABLET: 100 | 30 days supply | Qty: 30 | Fill #1

## 2017-03-25 MED FILL — POLYETHYLENE GLYCOL 3350 PO: 8 days supply | Qty: 255 | Fill #1

## 2017-03-25 MED FILL — FERROUS SULFATE 325 MG TAB: 325 (65 FE) | 10 days supply | Qty: 10 | Fill #3

## 2017-03-25 MED FILL — AMLODIPINE BESYLATE 5 MG TA: 5 | 30 days supply | Qty: 30 | Fill #3

## 2017-04-07 ENCOUNTER — Encounter: Payer: Self-pay | Admitting: Internal Medicine

## 2017-04-07 ENCOUNTER — Ambulatory Visit: Payer: Self-pay | Attending: Internal Medicine | Admitting: Internal Medicine

## 2017-04-07 ENCOUNTER — Ambulatory Visit (HOSPITAL_COMMUNITY)
Admission: RE | Admit: 2017-04-07 | Discharge: 2017-04-07 | Disposition: A | Discharge status: Home / Self Care | Payer: Self-pay | Source: Ambulatory Visit | Attending: Internal Medicine | Admitting: Internal Medicine

## 2017-04-07 VITALS — BP 143/91 | HR 81 | Temp 98.9°F | Resp 16 | Wt 309.4 lb

## 2017-04-07 DIAGNOSIS — K625 Hemorrhage of anus and rectum: Secondary | ICD-10-CM | POA: Insufficient documentation

## 2017-04-07 DIAGNOSIS — G8929 Other chronic pain: Secondary | ICD-10-CM | POA: Insufficient documentation

## 2017-04-07 DIAGNOSIS — Z882 Allergy status to sulfonamides status: Secondary | ICD-10-CM | POA: Insufficient documentation

## 2017-04-07 DIAGNOSIS — M25571 Pain in right ankle and joints of right foot: Secondary | ICD-10-CM | POA: Insufficient documentation

## 2017-04-07 DIAGNOSIS — Z8739 Personal history of other diseases of the musculoskeletal system and connective tissue: Secondary | ICD-10-CM

## 2017-04-07 DIAGNOSIS — Z79899 Other long term (current) drug therapy: Secondary | ICD-10-CM | POA: Insufficient documentation

## 2017-04-07 DIAGNOSIS — M19072 Primary osteoarthritis, left ankle and foot: Secondary | ICD-10-CM | POA: Insufficient documentation

## 2017-04-07 DIAGNOSIS — M19071 Primary osteoarthritis, right ankle and foot: Secondary | ICD-10-CM | POA: Insufficient documentation

## 2017-04-07 DIAGNOSIS — Z880 Allergy status to penicillin: Secondary | ICD-10-CM | POA: Insufficient documentation

## 2017-04-07 DIAGNOSIS — M25572 Pain in left ankle and joints of left foot: Secondary | ICD-10-CM | POA: Insufficient documentation

## 2017-04-07 DIAGNOSIS — I10 Essential (primary) hypertension: Secondary | ICD-10-CM | POA: Insufficient documentation

## 2017-04-07 DIAGNOSIS — M7989 Other specified soft tissue disorders: Secondary | ICD-10-CM | POA: Insufficient documentation

## 2017-04-07 DIAGNOSIS — M109 Gout, unspecified: Secondary | ICD-10-CM | POA: Insufficient documentation

## 2017-04-07 DIAGNOSIS — D500 Iron deficiency anemia secondary to blood loss (chronic): Secondary | ICD-10-CM | POA: Insufficient documentation

## 2017-04-07 DIAGNOSIS — Z7982 Long term (current) use of aspirin: Secondary | ICD-10-CM | POA: Insufficient documentation

## 2017-04-07 DIAGNOSIS — K648 Other hemorrhoids: Secondary | ICD-10-CM | POA: Insufficient documentation

## 2017-04-07 DIAGNOSIS — F1011 Alcohol abuse, in remission: Secondary | ICD-10-CM | POA: Insufficient documentation

## 2017-04-07 DIAGNOSIS — M25512 Pain in left shoulder: Secondary | ICD-10-CM | POA: Insufficient documentation

## 2017-04-07 DIAGNOSIS — Z760 Encounter for issue of repeat prescription: Secondary | ICD-10-CM | POA: Insufficient documentation

## 2017-04-07 MED ORDER — FERROUS SULFATE 325 (65 FE) MG PO TABS: 325.0000 mg | ORAL_TABLET | Freq: Every day | ORAL | 1 refills | Status: DC

## 2017-04-07 MED ORDER — AMLODIPINE BESYLATE 10 MG PO TABS: 10.0000 mg | ORAL_TABLET | Freq: Every day | ORAL | 11 refills | Status: DC

## 2017-04-07 NOTE — Patient Instructions (Addendum)
[facility].  Please apply of [facility] discount.   Increase Norvasc to 10 mg daily.
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
## 2017-04-08 LAB — CBC
Hematocrit: 38 % (ref 37.5–51.0)
Hemoglobin: 12.4 g/dL — ABNORMAL LOW (ref 13.0–17.7)
MCH: 29.1 pg (ref 26.6–33.0)
MCHC: 32.6 g/dL (ref 31.5–35.7)
MCV: 89 fL (ref 79–97)
Platelets: 233 10*3/uL (ref 150–379)
RBC: 4.26 x10E6/uL (ref 4.14–5.80)
RDW: 13.5 % (ref 12.3–15.4)
WBC: 4.2 10*3/uL (ref 3.4–10.8)

## 2017-04-08 LAB — URIC ACID: Uric Acid: 7 mg/dL (ref 3.7–8.6)

## 2017-04-10 ENCOUNTER — Telehealth: Payer: Self-pay | Admitting: Internal Medicine

## 2017-04-10 ENCOUNTER — Other Ambulatory Visit: Payer: Self-pay | Admitting: Internal Medicine

## 2017-04-10 MED FILL — AMLODIPINE BESYLATE 10 MG T: 10 | 30 days supply | Qty: 30 | Fill #0

## 2017-04-10 MED FILL — FERROUS SULFATE 325 MG TAB: 325 (65 FE) | 30 days supply | Qty: 30 | Fill #0

## 2017-04-10 NOTE — Telephone Encounter (Signed)
Phone call placed to patient this evening to discuss results of x-rays of the ankles.  I got an answering machine.  I did not leave a message.  I will have my CMA try to reach him with results next wk.  Of note x-ray of LT foot shows osteochondral lesion along the medial talar. I spoke with radiologist about it. He said this is usually due to previous  Trauma and can be painful. Will offer ortho referral once pt has [facility] discount.

## 2017-04-13 ENCOUNTER — Telehealth: Payer: Self-pay | Admitting: Internal Medicine

## 2017-04-13 DIAGNOSIS — Z8739 Personal history of other diseases of the musculoskeletal system and connective tissue: Secondary | ICD-10-CM

## 2017-04-13 MED FILL — ALLOPURINOL 100 MG TABLET: 100 | 30 days supply | Qty: 60 | Fill #1

## 2017-04-13 NOTE — Telephone Encounter (Signed)
Returned pt call and made pt aware of lab results and and xray results

## 2017-04-13 NOTE — Telephone Encounter (Signed)
Pt came by the office requesting med refill on allopurinol (ZYLOPRIM) 100 MG tablet

## 2017-04-13 NOTE — Telephone Encounter (Signed)
Pt came into office requesting xray results and lab results, please f/up

## 2017-04-14 MED ORDER — ALLOPURINOL 100 MG PO TABS: 200.0000 mg | ORAL_TABLET | Freq: Every day | ORAL | 2 refills | Status: DC

## 2017-04-14 NOTE — Telephone Encounter (Signed)
Refilled

## 2017-04-23 MED FILL — COLCHICINE 0.6 MG TABS: 0.6 | 10 days supply | Qty: 10 | Fill #1

## 2017-05-04 MED FILL — COLCHICINE 0.6 MG TABS: 0.6 | 10 days supply | Qty: 10 | Fill #2

## 2017-05-06 ENCOUNTER — Ambulatory Visit: Payer: Self-pay | Attending: Internal Medicine

## 2017-05-08 ENCOUNTER — Telehealth: Payer: Self-pay | Admitting: Internal Medicine

## 2017-05-08 NOTE — Telephone Encounter (Signed)
Pt called to speak with the financial dept. Please call him back

## 2017-05-12 MED FILL — AMLODIPINE BESYLATE 10 MG T: 10 | 30 days supply | Qty: 30 | Fill #1

## 2017-05-12 MED FILL — COLCHICINE 0.6 MG TABS: 0.6 | 10 days supply | Qty: 10 | Fill #3

## 2017-05-12 MED FILL — POLYETHYLENE GLYCOL 3350 PO: 7 days supply | Qty: 238 | Fill #2

## 2017-05-12 MED FILL — ALLOPURINOL 100 MG TABLET: 100 | 30 days supply | Qty: 60 | Fill #2

## 2017-05-12 MED FILL — FERROUS SULFATE 325 MG TAB: 325 (65 FE) | 30 days supply | Qty: 30 | Fill #1

## 2017-05-26 MED FILL — COLCHICINE 0.6 MG TABS: 0.6 | 10 days supply | Qty: 10 | Fill #4

## 2017-06-03 MED FILL — !COLCRYS 0.6 MG TABLET: 0.6 MG | 30 days supply | Qty: 30 | Fill #5

## 2017-06-10 ENCOUNTER — Ambulatory Visit: Payer: Self-pay | Attending: Internal Medicine

## 2017-06-10 MED FILL — AMLODIPINE BESYLATE 10 MG T: 10 | 30 days supply | Qty: 30 | Fill #2

## 2017-06-10 MED FILL — ?ALLOPURINOL 100 MG TABS: 100 | 30 days supply | Qty: 60 | Fill #3

## 2017-06-11 MED FILL — FERROUS SULFATE 325 MG TAB: 325 (65 FE) | 30 days supply | Qty: 30 | Fill #2

## 2017-06-25 MED FILL — POLYETHYLENE GLYCOL 3350 PO: 15 days supply | Qty: 510 | Fill #3

## 2017-06-25 MED FILL — $COLCRYS 0.6 MG TABLET: 0.6 | 30 days supply | Qty: 30 | Fill #6

## 2017-07-09 ENCOUNTER — Ambulatory Visit: Payer: Self-pay | Admitting: Internal Medicine

## 2017-07-09 MED FILL — ?ALLOPURINOL 100 MG TABS: 100 | 30 days supply | Qty: 60 | Fill #0

## 2017-07-09 MED FILL — FERROUS SULFATE 325 MG TAB: 325 (65 FE) | 30 days supply | Qty: 30 | Fill #3

## 2017-07-13 ENCOUNTER — Encounter: Payer: Self-pay | Admitting: Internal Medicine

## 2017-07-13 ENCOUNTER — Ambulatory Visit: Payer: Self-pay | Attending: Internal Medicine | Admitting: Internal Medicine

## 2017-07-13 VITALS — BP 123/82 | HR 93 | Temp 98.2°F | Resp 16 | Wt 286.8 lb

## 2017-07-13 DIAGNOSIS — Z880 Allergy status to penicillin: Secondary | ICD-10-CM | POA: Insufficient documentation

## 2017-07-13 DIAGNOSIS — G8929 Other chronic pain: Secondary | ICD-10-CM

## 2017-07-13 DIAGNOSIS — M109 Gout, unspecified: Secondary | ICD-10-CM | POA: Insufficient documentation

## 2017-07-13 DIAGNOSIS — L739 Follicular disorder, unspecified: Secondary | ICD-10-CM

## 2017-07-13 DIAGNOSIS — R21 Rash and other nonspecific skin eruption: Secondary | ICD-10-CM | POA: Insufficient documentation

## 2017-07-13 DIAGNOSIS — M25572 Pain in left ankle and joints of left foot: Secondary | ICD-10-CM | POA: Insufficient documentation

## 2017-07-13 DIAGNOSIS — D509 Iron deficiency anemia, unspecified: Secondary | ICD-10-CM | POA: Insufficient documentation

## 2017-07-13 DIAGNOSIS — Z881 Allergy status to other antibiotic agents status: Secondary | ICD-10-CM | POA: Insufficient documentation

## 2017-07-13 DIAGNOSIS — Z7982 Long term (current) use of aspirin: Secondary | ICD-10-CM | POA: Insufficient documentation

## 2017-07-13 DIAGNOSIS — Z882 Allergy status to sulfonamides status: Secondary | ICD-10-CM | POA: Insufficient documentation

## 2017-07-13 DIAGNOSIS — R600 Localized edema: Secondary | ICD-10-CM

## 2017-07-13 DIAGNOSIS — I10 Essential (primary) hypertension: Secondary | ICD-10-CM | POA: Insufficient documentation

## 2017-07-13 DIAGNOSIS — K429 Umbilical hernia without obstruction or gangrene: Secondary | ICD-10-CM

## 2017-07-13 DIAGNOSIS — Z79899 Other long term (current) drug therapy: Secondary | ICD-10-CM | POA: Insufficient documentation

## 2017-07-13 DIAGNOSIS — Z8739 Personal history of other diseases of the musculoskeletal system and connective tissue: Secondary | ICD-10-CM

## 2017-07-13 DIAGNOSIS — K648 Other hemorrhoids: Secondary | ICD-10-CM

## 2017-07-13 DIAGNOSIS — K625 Hemorrhage of anus and rectum: Secondary | ICD-10-CM

## 2017-07-13 HISTORY — DX: Follicular disorder, unspecified: L73.9

## 2017-07-13 MED ORDER — MUPIROCIN CALCIUM 2 % EX CREA: TOPICAL_CREAM | CUTANEOUS | 0 refills | Status: DC

## 2017-07-13 MED ORDER — PREDNISONE 20 MG PO TABS: ORAL_TABLET | ORAL | 1 refills | Status: DC

## 2017-07-13 MED ORDER — MUPIROCIN 2 % EX OINT: TOPICAL_OINTMENT | CUTANEOUS | 0 refills | Status: DC

## 2017-07-13 MED FILL — MUPIROCIN 2% OINTMENT: 2 | 10 days supply | Qty: 22 | Fill #0

## 2017-07-13 MED FILL — predniSONE 20 MG TABS: 20 | 12 days supply | Qty: 15 | Fill #0

## 2017-07-13 MED FILL — AMLODIPINE BESYLATE 10 MG T: 10 | 30 days supply | Qty: 30 | Fill #3
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
## 2017-07-13 NOTE — Patient Instructions (Signed)

## 2017-07-14 LAB — URIC ACID: Uric Acid: 8.1 mg/dL (ref 3.7–8.6)

## 2017-07-14 LAB — HEMATOCRIT: Hematocrit: 39.9 % (ref 37.5–51.0)

## 2017-07-15 ENCOUNTER — Telehealth: Payer: Self-pay | Admitting: Internal Medicine

## 2017-07-15 DIAGNOSIS — Z8739 Personal history of other diseases of the musculoskeletal system and connective tissue: Secondary | ICD-10-CM

## 2017-07-15 MED ORDER — ALLOPURINOL 300 MG PO TABS: 300.0000 mg | ORAL_TABLET | Freq: Every day | ORAL | 1 refills | Status: DC

## 2017-07-15 MED FILL — ?ALLOPURINOL 300 MG TABL: 300 | 30 days supply | Qty: 30 | Fill #0

## 2017-07-15 NOTE — Telephone Encounter (Signed)
PC placed to pt today.  Got voicemail.  Did not leave message.  Will have CMA try to reach him again. Pt needs to know that the anemia has corrected.  He can stop iron supplement for now but please restart if he starts having rectal bleeding again.  His uric acid level for the gout has increased from 7 to 8.  We are trying to get him at least to 6.5 or lower.  Please inquire whether he is taking the Allopurinol consistently.  If he is, we need to increase dose from 200 mg daily to 300 mg daily.  [facility].

## 2017-07-15 NOTE — Telephone Encounter (Signed)
Pt is aware of results and doesn't have any questions or concerns. Pt will be coming by to get rx

## 2017-07-22 ENCOUNTER — Ambulatory Visit (INDEPENDENT_AMBULATORY_CARE_PROVIDER_SITE_OTHER): Payer: Self-pay | Admitting: Surgery

## 2017-07-22 ENCOUNTER — Encounter: Payer: Self-pay | Admitting: Surgery

## 2017-07-22 VITALS — BP 132/87 | HR 67 | Temp 98.0°F | Ht 70.0 in | Wt 283.0 lb

## 2017-07-22 DIAGNOSIS — K649 Unspecified hemorrhoids: Secondary | ICD-10-CM

## 2017-07-22 DIAGNOSIS — K429 Umbilical hernia without obstruction or gangrene: Secondary | ICD-10-CM

## 2017-07-22 NOTE — Patient Instructions (Addendum)
You have requested for Exam under Anesthesia. This has been scheduled on 2/27 by Dr. Aleen CampiPiscoya at [facility].  Please see your (blue)pre-care sheet for information.    How to Take a Sitz Bath A sitz bath is a warm water bath that is taken while you are sitting down. The water should only come up to your hips and should cover your buttocks. Your health care provider may recommend a sitz bath to help you:  Clean the lower part of your body, including your genital area.  With itching.  With pain.  With sore muscles or muscles that tighten or spasm.  How to take a sitz bath Take 3-4 sitz baths per day or as told by your health care provider. 1. Partially fill a bathtub with warm water. You will only need the water to be deep enough to cover your hips and buttocks when you are sitting in it. 2. If your health care provider told you to put medicine in the water, follow the directions exactly. 3. Sit in the water and open the tub drain a little. 4. Turn on the warm water again to keep the tub at the correct level. Keep the water running constantly. 5. Soak in the water for 15-20 minutes or as told by your health care provider. 6. After the sitz bath, pat the affected area dry first. Do not rub it. 7. Be careful when you stand up after the sitz bath because you may feel dizzy.  Contact a health care provider if:  Your symptoms get worse. Do not continue with sitz baths if your symptoms get worse.  You have new symptoms. Do not continue with sitz baths until you talk with your health care provider. This information is not intended to replace advice given to you by your health care provider. Make sure you discuss any questions you have with your health care provider. Document Released: 02/16/2004 Document Revised: 10/24/2015 Document Reviewed: 05/24/2014 Elsevier Interactive Patient Education  2018 [employer]   Do not strain only use the restroom when needed. Avoid  constipation.  Informed patient that they should increase water intake and activity level as much as possible to help with bowel movement. Also educated that they may use Miralax over the counter x 2 doses, 6 hours apart, followed by Dulcolax x 2 doses, 6 hours apart until they have a successful bowel movement. If they are unsuccessful with oral medications, they will need to do 2 fleets enemas back to back. Asked patient to call back for further instructions if after taking all doses of these medications, they are still unsuccessful with a bowel movement.  Patient verbalizes understanding of this information.   Continue with the fiber and Miralax.
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
## 2017-07-23 ENCOUNTER — Telehealth: Payer: Self-pay

## 2017-07-23 MED ORDER — FLEET ENEMA 7-19 GM/118ML RE ENEM: ENEMA | RECTAL | 0 refills | Status: DC

## 2017-07-23 NOTE — Telephone Encounter (Signed)
Patient returned call at this time and I verbalized to him that Dr. Aleen CampiPiscoya would like to advise him to stop his aspirin 5 days prior to his surgery on 2/27. Stopping his aspirin on 2/22. I also verbalized that Dr. Aleen CampiPiscoya advises to do a fleet enema the night before and the morning of. I asked if he would like for me to send it to his pharmacy or for him to purchase an over the counter enema. He verbalized to send it to [facility]. I verbalized understanding and stated that I would also send him a letter with these instructions. He verbalized understanding.

## 2017-07-23 NOTE — Telephone Encounter (Signed)
Left message for patient to give our office a call regarding message from Dr. Aleen CampiPiscoya.

## 2017-07-23 NOTE — Telephone Encounter (Signed)
-----   Message from Henrene DodgeJose Piscoya, MD sent at 07/22/2017  5:33 PM EST ----- Regarding: OR Hi Valli Randol,  I forgot to realize that the patient has Aspiring 325 mg listed as one of his meds.  Looks like it's for pain, though it's prescribed weird.  Can you please inform him that he cannot take Aspirin for 5 days prior to surgery?  All his OR orders are in.  We'll also do enema night before and morning of to clean his rectum out.  Thanks!  Visteon CorporationJose

## 2017-07-24 ENCOUNTER — Telehealth: Payer: Self-pay | Admitting: Surgery

## 2017-07-24 ENCOUNTER — Telehealth: Payer: Self-pay

## 2017-07-24 NOTE — Telephone Encounter (Signed)
Medical clearance has been sent for patient to Dr. Henriette CombsJohnson's office.

## 2017-07-27 MED FILL — $COLCRYS 0.6 MG TABLET: 0.6 | 20 days supply | Qty: 20 | Fill #7

## 2017-07-29 ENCOUNTER — Encounter (INDEPENDENT_AMBULATORY_CARE_PROVIDER_SITE_OTHER): Payer: Self-pay | Admitting: Orthopedic Surgery

## 2017-07-29 ENCOUNTER — Ambulatory Visit (INDEPENDENT_AMBULATORY_CARE_PROVIDER_SITE_OTHER): Payer: Self-pay | Admitting: Orthopedic Surgery

## 2017-07-29 ENCOUNTER — Encounter
Admission: RE | Admit: 2017-07-29 | Discharge: 2017-07-29 | Disposition: A | Discharge status: Home / Self Care | Payer: Self-pay | Source: Ambulatory Visit | Attending: Surgery | Admitting: Surgery

## 2017-07-29 ENCOUNTER — Other Ambulatory Visit: Payer: Self-pay

## 2017-07-29 ENCOUNTER — Ambulatory Visit (INDEPENDENT_AMBULATORY_CARE_PROVIDER_SITE_OTHER): Payer: Self-pay

## 2017-07-29 DIAGNOSIS — M25512 Pain in left shoulder: Secondary | ICD-10-CM

## 2017-07-29 DIAGNOSIS — M25572 Pain in left ankle and joints of left foot: Secondary | ICD-10-CM

## 2017-07-29 DIAGNOSIS — G8929 Other chronic pain: Secondary | ICD-10-CM

## 2017-07-29 HISTORY — DX: Personal history of other diseases of the digestive system: Z87.19

## 2017-07-29 HISTORY — DX: Gastro-esophageal reflux disease without esophagitis: K21.9

## 2017-07-29 HISTORY — DX: Personal history of Methicillin resistant Staphylococcus aureus infection: Z86.14

## 2017-07-29 HISTORY — DX: Unspecified osteoarthritis, unspecified site: M19.90

## 2017-07-29 MED ORDER — METHYLPREDNISOLONE ACETATE 40 MG/ML IJ SUSP
40.0000 mg | INTRAMUSCULAR | Status: AC | PRN
  Administered 2017-07-29: 40 mg via INTRA_ARTICULAR

## 2017-07-29 MED ORDER — BUPIVACAINE HCL 0.5 % IJ SOLN
9.0000 mL | INTRAMUSCULAR | Status: AC | PRN
  Administered 2017-07-29: 9 mL via INTRA_ARTICULAR

## 2017-07-29 MED ORDER — LIDOCAINE HCL 1 % IJ SOLN
5.0000 mL | INTRAMUSCULAR | Status: AC | PRN
  Administered 2017-07-29: 5 mL

## 2017-07-29 NOTE — Patient Instructions (Addendum)
Your procedure is scheduled on: 08-05-17 [facility] Report to Same Day Surgery 2nd floor medical mall [facility] Entrance-take elevator on left to 2nd floor.  Check in with surgery information desk.) To find out your arrival time please call [phone] between 1PM - 3PM on 08-04-17 TUESDAY  Remember: Instructions that are not followed completely may result in serious medical risk, up to and including death, or upon the discretion of your surgeon and anesthesiologist your surgery may need to be rescheduled.    _x___ 1. Do not eat food after midnight the night before your procedure. NO GUM OR CANDY AFTER MIDNIGHT. You may drink clear liquids up to 2 hours before you are scheduled to arrive at the hospital for your procedure.  Do not drink clear liquids within 2 hours of your scheduled arrival to the hospital.  Clear liquids include  --Water or Apple juice without pulp  --Clear carbohydrate beverage such as ClearFast or Gatorade  --Black Coffee or Clear Tea (No milk, no creamers, do not add anything to the coffee or Tea     __x__ 2. No Alcohol for 24 hours before or after surgery.   __x__3. No Smoking or e-cigarettes for 24 prior to surgery.  Do not use any chewable tobacco products for at least 6 hour prior to surgery   ____  4. Bring all medications with you on the day of surgery if instructed.    __x__ 5. Notify your doctor if there is any change in your medical condition     (cold, fever, infections).    x___6. On the morning of surgery brush your teeth with toothpaste and water.  You may rinse your mouth with mouth wash if you wish.  Do not swallow any toothpaste or mouthwash.   Do not wear jewelry, make-up, hairpins, clips or nail polish.  Do not wear lotions, powders, or perfumes. You may wear deodorant.  Do not shave 48 hours prior to surgery. Men may shave face and neck.  Do not bring valuables to the hospital.    [facility] is not responsible for any belongings or  valuables.               Contacts, dentures or bridgework may not be worn into surgery.  Leave your suitcase in the car. After surgery it may be brought to your room.  For patients admitted to the hospital, discharge time is determined by your treatment team.  _  Patients discharged the day of surgery will not be allowed to drive home.  You will need someone to drive you home and stay with you the night of your procedure.    Please read over the following fact sheets that you were given:   [facility] Preparing for Surgery and or MRSA Information   _x___ TAKE THE FOLLOWING MEDICATION THE MORNING OF SURGERY WITH A SMALL SIP OF WATER. These include:  1. AMLODIPINE  2. ALLOPURINOL  3. COLCHICINE  4.  5.  6.  _X___Fleets enema as directed-DO FLEET ENEMA AT HOME THE NIGHT BEFORE SURGERY AND AM OF SURGERY( 1 HOUR PRIOR TO ARRIVAL TIME TO HOSPITAL)  ____ Use CHG Soap or sage wipes as directed on instruction sheet   ____ Use inhalers on the day of surgery and bring to hospital day of surgery  ____ Stop Metformin and Janumet 2 days prior to surgery.    ____ Take 1/2 of usual insulin dose the night before surgery and none on the morning surgery.   _x___  Follow recommendations from Cardiologist, Pulmonologist or PCP regarding stopping Aspirin, Coumadin, Plavix ,Eliquis, Effient, or Pradaxa, and Pletal-PT TO STOP 325 MG ASPIRIN 5 DAYS PRIOR TO SURGERY PER DR PISCOYA  X____Stop Anti-inflammatories such as Advil, Aleve, Ibuprofen, Motrin, Naproxen, Naprosyn, Goodies powders or aspirin products NOW- OK to take Tylenol    ____ Stop supplements until after surgery.     ____ Bring C-Pap to the hospital.
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
## 2017-07-30 ENCOUNTER — Encounter
Admission: RE | Admit: 2017-07-30 | Discharge: 2017-07-30 | Disposition: A | Discharge status: Home / Self Care | Payer: Self-pay | Source: Ambulatory Visit | Attending: Surgery | Admitting: Surgery

## 2017-07-30 DIAGNOSIS — D641 Secondary sideroblastic anemia due to disease: Secondary | ICD-10-CM | POA: Insufficient documentation

## 2017-07-30 DIAGNOSIS — Z01810 Encounter for preprocedural cardiovascular examination: Secondary | ICD-10-CM | POA: Insufficient documentation

## 2017-07-30 DIAGNOSIS — Z6841 Body Mass Index (BMI) 40.0 and over, adult: Secondary | ICD-10-CM | POA: Insufficient documentation

## 2017-07-30 DIAGNOSIS — K449 Diaphragmatic hernia without obstruction or gangrene: Secondary | ICD-10-CM | POA: Insufficient documentation

## 2017-07-30 DIAGNOSIS — K219 Gastro-esophageal reflux disease without esophagitis: Secondary | ICD-10-CM | POA: Insufficient documentation

## 2017-07-30 DIAGNOSIS — I10 Essential (primary) hypertension: Secondary | ICD-10-CM | POA: Insufficient documentation

## 2017-07-30 DIAGNOSIS — Z01812 Encounter for preprocedural laboratory examination: Secondary | ICD-10-CM | POA: Insufficient documentation

## 2017-07-30 LAB — SURGICAL PCR SCREEN
MRSA, PCR: NEGATIVE
Staphylococcus aureus: NEGATIVE

## 2017-07-30 LAB — HEMOGLOBIN: Hemoglobin: 13.1 g/dL (ref 13.0–18.0)

## 2017-07-30 MED ORDER — FLEET ENEMA 7-19 GM/118ML RE ENEM
1.0000 | ENEMA | Freq: Once | RECTAL | Status: DC
  Filled 2017-07-30: qty 1

## 2017-07-31 NOTE — Telephone Encounter (Signed)
Pt advised of pre op date/time and sx date. Sx: 08/05/17 with Dr Albertina SenegalPiscoya--EUA and hemorrhoidectomy.  Pre op: 07/29/17 between 9-1:00pm--Phone interview.   Patient made aware to call [phone], between 1-3:00pm the day before surgery, to find out what time to arrive.

## 2017-08-04 MED ORDER — CLINDAMYCIN PHOSPHATE 900 MG/50ML IV SOLN
900.0000 mg | INTRAVENOUS | Status: AC
Start: 2017-08-05 — End: 2017-08-05
  Administered 2017-08-05: 900 mg via INTRAVENOUS

## 2017-08-05 ENCOUNTER — Other Ambulatory Visit: Payer: Self-pay

## 2017-08-05 ENCOUNTER — Encounter: Payer: Self-pay | Admitting: *Deleted

## 2017-08-05 ENCOUNTER — Ambulatory Visit
Admission: RE | Admit: 2017-08-05 | Discharge: 2017-08-05 | Disposition: A | Discharge status: Home / Self Care | Payer: Self-pay | Source: Ambulatory Visit | Attending: Surgery | Admitting: Surgery

## 2017-08-05 ENCOUNTER — Encounter
Admission: RE | Disposition: A | Discharge status: Home / Self Care | Payer: Self-pay | Source: Ambulatory Visit | Attending: Surgery

## 2017-08-05 ENCOUNTER — Ambulatory Visit: Payer: Self-pay | Admitting: Anesthesiology

## 2017-08-05 DIAGNOSIS — M109 Gout, unspecified: Secondary | ICD-10-CM | POA: Insufficient documentation

## 2017-08-05 DIAGNOSIS — Z881 Allergy status to other antibiotic agents status: Secondary | ICD-10-CM | POA: Insufficient documentation

## 2017-08-05 DIAGNOSIS — K429 Umbilical hernia without obstruction or gangrene: Secondary | ICD-10-CM | POA: Insufficient documentation

## 2017-08-05 DIAGNOSIS — Z880 Allergy status to penicillin: Secondary | ICD-10-CM | POA: Insufficient documentation

## 2017-08-05 DIAGNOSIS — Z7982 Long term (current) use of aspirin: Secondary | ICD-10-CM | POA: Insufficient documentation

## 2017-08-05 DIAGNOSIS — I10 Essential (primary) hypertension: Secondary | ICD-10-CM | POA: Insufficient documentation

## 2017-08-05 DIAGNOSIS — K644 Residual hemorrhoidal skin tags: Secondary | ICD-10-CM

## 2017-08-05 DIAGNOSIS — E538 Deficiency of other specified B group vitamins: Secondary | ICD-10-CM | POA: Insufficient documentation

## 2017-08-05 DIAGNOSIS — D649 Anemia, unspecified: Secondary | ICD-10-CM | POA: Insufficient documentation

## 2017-08-05 DIAGNOSIS — K648 Other hemorrhoids: Secondary | ICD-10-CM | POA: Insufficient documentation

## 2017-08-05 DIAGNOSIS — Z6841 Body Mass Index (BMI) 40.0 and over, adult: Secondary | ICD-10-CM | POA: Insufficient documentation

## 2017-08-05 DIAGNOSIS — Z79899 Other long term (current) drug therapy: Secondary | ICD-10-CM | POA: Insufficient documentation

## 2017-08-05 DIAGNOSIS — Z882 Allergy status to sulfonamides status: Secondary | ICD-10-CM | POA: Insufficient documentation

## 2017-08-05 HISTORY — PX: EVALUATION UNDER ANESTHESIA WITH HEMORRHOIDECTOMY: SHX5624

## 2017-08-05 SURGERY — EXAM UNDER ANESTHESIA WITH HEMORRHOIDECTOMY
Anesthesia: General | Wound class: Clean Contaminated

## 2017-08-05 MED ORDER — CHLORHEXIDINE GLUCONATE CLOTH 2 % EX PADS: 6.0000 | MEDICATED_PAD | Freq: Once | CUTANEOUS | Status: DC

## 2017-08-05 MED ORDER — FENTANYL CITRATE (PF) 250 MCG/5ML IJ SOLN
INTRAMUSCULAR | Status: AC
  Filled 2017-08-05: qty 5

## 2017-08-05 MED ORDER — LACTATED RINGERS IV SOLN
INTRAVENOUS | Status: DC
  Administered 2017-08-05 (×2): via INTRAVENOUS

## 2017-08-05 MED ORDER — ONDANSETRON HCL 4 MG/2ML IJ SOLN
INTRAMUSCULAR | Status: DC | PRN
  Administered 2017-08-05: 4 mg via INTRAVENOUS

## 2017-08-05 MED ORDER — LIDOCAINE HCL 2 % EX GEL
CUTANEOUS | Status: AC
  Filled 2017-08-05: qty 10

## 2017-08-05 MED ORDER — MIDAZOLAM HCL 2 MG/2ML IJ SOLN
INTRAMUSCULAR | Status: AC
  Filled 2017-08-05: qty 2

## 2017-08-05 MED ORDER — OXYCODONE HCL 5 MG PO TABS
ORAL_TABLET | ORAL | Status: AC
  Filled 2017-08-05: qty 1

## 2017-08-05 MED ORDER — GELATIN ABSORBABLE 100 CM EX MISC
CUTANEOUS | Status: AC
  Filled 2017-08-05: qty 1

## 2017-08-05 MED ORDER — BUPIVACAINE LIPOSOME 1.3 % IJ SUSP
INTRAMUSCULAR | Status: AC
  Filled 2017-08-05: qty 20

## 2017-08-05 MED ORDER — FENTANYL CITRATE (PF) 100 MCG/2ML IJ SOLN
INTRAMUSCULAR | Status: AC
  Administered 2017-08-05: 25 ug via INTRAVENOUS
  Filled 2017-08-05: qty 2

## 2017-08-05 MED ORDER — SODIUM CHLORIDE 0.9 % IJ SOLN
INTRAMUSCULAR | Status: AC
  Filled 2017-08-05: qty 50

## 2017-08-05 MED ORDER — MIDAZOLAM HCL 2 MG/2ML IJ SOLN
INTRAMUSCULAR | Status: DC | PRN
  Administered 2017-08-05: 2 mg via INTRAVENOUS

## 2017-08-05 MED ORDER — LIDOCAINE HCL (CARDIAC) 20 MG/ML IV SOLN
INTRAVENOUS | Status: DC | PRN
  Administered 2017-08-05: 40 mg via INTRAVENOUS

## 2017-08-05 MED ORDER — FAMOTIDINE 20 MG PO TABS
ORAL_TABLET | ORAL | Status: AC
  Administered 2017-08-05: 20 mg via ORAL
  Filled 2017-08-05: qty 1

## 2017-08-05 MED ORDER — FENTANYL CITRATE (PF) 100 MCG/2ML IJ SOLN
INTRAMUSCULAR | Status: DC | PRN
  Administered 2017-08-05: 25 ug via INTRAVENOUS
  Administered 2017-08-05: 50 ug via INTRAVENOUS
  Administered 2017-08-05 (×2): 25 ug via INTRAVENOUS
  Administered 2017-08-05: 100 ug via INTRAVENOUS
  Administered 2017-08-05: 25 ug via INTRAVENOUS

## 2017-08-05 MED ORDER — CLINDAMYCIN PHOSPHATE 900 MG/50ML IV SOLN
INTRAVENOUS | Status: AC
  Filled 2017-08-05: qty 50

## 2017-08-05 MED ORDER — DEXAMETHASONE SODIUM PHOSPHATE 10 MG/ML IJ SOLN
INTRAMUSCULAR | Status: AC
  Filled 2017-08-05: qty 1

## 2017-08-05 MED ORDER — LIDOCAINE HCL (PF) 2 % IJ SOLN
INTRAMUSCULAR | Status: AC
  Filled 2017-08-05: qty 10

## 2017-08-05 MED ORDER — SEVOFLURANE IN SOLN
RESPIRATORY_TRACT | Status: AC
  Filled 2017-08-05: qty 250

## 2017-08-05 MED ORDER — ONDANSETRON HCL 4 MG/2ML IJ SOLN
INTRAMUSCULAR | Status: AC
  Filled 2017-08-05: qty 2

## 2017-08-05 MED ORDER — PROPOFOL 10 MG/ML IV BOLUS
INTRAVENOUS | Status: DC | PRN
  Administered 2017-08-05: 180 mg via INTRAVENOUS

## 2017-08-05 MED ORDER — BUPIVACAINE HCL (PF) 0.5 % IJ SOLN
INTRAMUSCULAR | Status: AC
  Filled 2017-08-05: qty 30

## 2017-08-05 MED ORDER — BUPIVACAINE-EPINEPHRINE (PF) 0.5% -1:200000 IJ SOLN
INTRAMUSCULAR | Status: DC | PRN
  Administered 2017-08-05: 30 mL via PERINEURAL

## 2017-08-05 MED ORDER — GABAPENTIN 300 MG PO CAPS
ORAL_CAPSULE | ORAL | Status: AC
  Administered 2017-08-05: 300 mg via ORAL
  Filled 2017-08-05: qty 1

## 2017-08-05 MED ORDER — FAMOTIDINE 20 MG PO TABS
20.0000 mg | ORAL_TABLET | Freq: Once | ORAL | Status: AC
  Administered 2017-08-05: 20 mg via ORAL

## 2017-08-05 MED ORDER — PROMETHAZINE HCL 25 MG/ML IJ SOLN: 6.2500 mg | INTRAMUSCULAR | Status: DC | PRN

## 2017-08-05 MED ORDER — BUPIVACAINE LIPOSOME 1.3 % IJ SUSP
INTRAMUSCULAR | Status: DC | PRN
  Administered 2017-08-05: 20 mL

## 2017-08-05 MED ORDER — OXYCODONE HCL 5 MG PO TABS
5.0000 mg | ORAL_TABLET | ORAL | Status: DC | PRN
  Administered 2017-08-05: 5 mg via ORAL

## 2017-08-05 MED ORDER — ACETAMINOPHEN 500 MG PO TABS
ORAL_TABLET | ORAL | Status: AC
  Administered 2017-08-05: 1000 mg via ORAL
  Filled 2017-08-05: qty 2

## 2017-08-05 MED ORDER — OXYCODONE HCL 5 MG PO TABS
5.0000 mg | ORAL_TABLET | Freq: Four times a day (QID) | ORAL | 0 refills | Status: DC | PRN
Start: 2017-08-05 — End: 2017-08-05

## 2017-08-05 MED ORDER — DEXAMETHASONE SODIUM PHOSPHATE 10 MG/ML IJ SOLN
INTRAMUSCULAR | Status: DC | PRN
  Administered 2017-08-05: 10 mg via INTRAVENOUS

## 2017-08-05 MED ORDER — ACETAMINOPHEN 500 MG PO TABS
1000.0000 mg | ORAL_TABLET | ORAL | Status: AC
  Administered 2017-08-05: 1000 mg via ORAL

## 2017-08-05 MED ORDER — GABAPENTIN 300 MG PO CAPS
300.0000 mg | ORAL_CAPSULE | ORAL | Status: AC
  Administered 2017-08-05: 300 mg via ORAL

## 2017-08-05 MED ORDER — FENTANYL CITRATE (PF) 100 MCG/2ML IJ SOLN
25.0000 ug | INTRAMUSCULAR | Status: DC | PRN
  Administered 2017-08-05 (×4): 25 ug via INTRAVENOUS

## 2017-08-05 MED ORDER — BUPIVACAINE-EPINEPHRINE (PF) 0.5% -1:200000 IJ SOLN
INTRAMUSCULAR | Status: AC
  Filled 2017-08-05: qty 30

## 2017-08-05 MED ORDER — LIDOCAINE HCL 2 % EX GEL
CUTANEOUS | Status: DC | PRN
  Administered 2017-08-05: 1

## 2017-08-05 MED ORDER — OXYCODONE HCL 5 MG PO TABS: 5.0000 mg | ORAL_TABLET | Freq: Four times a day (QID) | ORAL | 0 refills | Status: DC | PRN

## 2017-08-05 MED ORDER — PROPOFOL 10 MG/ML IV BOLUS
INTRAVENOUS | Status: AC
  Filled 2017-08-05: qty 20

## 2017-08-05 SURGICAL SUPPLY — 21 items
BLADE SURG 15 STRL SS SAFETY (BLADE) ×2 IMPLANT
BRIEF STRETCH MATERNITY 2XLG (MISCELLANEOUS) ×2 IMPLANT
CANISTER SUCT 1200ML W/VALVE (MISCELLANEOUS) ×2 IMPLANT
DRAPE LAPAROTOMY 100X77 ABD (DRAPES) ×2 IMPLANT
ELECT REM PT RETURN 9FT ADLT (ELECTROSURGICAL) ×2
ELECTRODE REM PT RTRN 9FT ADLT (ELECTROSURGICAL) ×1 IMPLANT
GLOVE BIO SURGEON STRL SZ7 (GLOVE) ×2 IMPLANT
GLOVE BIO SURGEON STRL SZ7.5 (GLOVE) ×2 IMPLANT
GOWN STRL REUS W/ TWL LRG LVL3 (GOWN DISPOSABLE) ×2 IMPLANT
GOWN STRL REUS W/TWL LRG LVL3 (GOWN DISPOSABLE) ×2
KIT TURNOVER KIT A (KITS) ×2 IMPLANT
LABEL OR SOLS (LABEL) ×2 IMPLANT
LIGASURE IMPACT 36 18CM CVD LR (INSTRUMENTS) ×2 IMPLANT
NEEDLE HYPO 25X1 1.5 SAFETY (NEEDLE) ×2 IMPLANT
NS IRRIG 500ML POUR BTL (IV SOLUTION) ×2 IMPLANT
PACK BASIN MINOR ARMC (MISCELLANEOUS) ×2 IMPLANT
PAD OB MATERNITY 4.3X12.25 (PERSONAL CARE ITEMS) ×2 IMPLANT
PAD PREP 24X41 OB/GYN DISP (PERSONAL CARE ITEMS) ×2 IMPLANT
SOL PREP PVP 2OZ (MISCELLANEOUS) ×2
SOLUTION PREP PVP 2OZ (MISCELLANEOUS) ×1 IMPLANT
SURGILUBE 2OZ TUBE FLIPTOP (MISCELLANEOUS) ×2 IMPLANT

## 2017-08-05 NOTE — Anesthesia Post-op Follow-up Note (Signed)
Anesthesia QCDR form completed.        

## 2017-08-05 NOTE — Op Note (Signed)
  Procedure Date:  08/05/2017  Pre-operative Diagnosis:  Bleeding hemorrhoids  Post-operative Diagnosis:  Bleeding internal and external hemorrhoids  Procedure:  Exam under Anesthesia, hemorrhoidectomy of three columns  Surgeon:  Howie IllJose Luis Marzella Miracle, MD  Anesthesia:  General endotracheal  Estimated Blood Loss:  20 ml  Specimens:  Right anterior, right posterior, and left lateral hemorrhoidal columns  Complications:  None  Indications for Procedure:  This is a 42 y.o. male with diagnosis of bleeding hemorrhoids.  In the office, exam was not adequate to fully reveal which hemorrhoidal columns were involved, and thus he is brought to the OR for further evaluation and management.  The risks of bleeding, abscess or infection, injury to surrounding structures, and need for further procedures were all discussed with the patient and was willing to proceed.  Description of Procedure: The patient was correctly identified in the preoperative area and brought into the operating room.  The patient was placed supine with VTE prophylaxis in place.  Appropriate time-outs were performed.  Anesthesia was induced and the patient was intubated.  Appropriate antibiotics were infused.  The patient was then placed in high lithotomy position.  The patient's perianal area was prepped and draped in usual sterile fashion.  Perianal block was performed using combination of Exparel and 0.5% bupivacaine with epi.  The anal canal was dilated using one, then two, and finally three fingers.  Exam revealed three enlarged hemorrhoidal columns. The bivalve retractor was placed exposing the right anterior column.  Bovie cautery and LigaSure were used to resect that hemorrhoidal column.  2-0 Vicryl suture was used to close the resected end, starting the column apex, leaving the external corner open for drainage.  The same was done for the two other hemorrhoidal columns.  The anal canal was irrigated and there was no gross bleeding.   A large gelfoam was rolled with lidocaine gel and placed into the anal canal.  The perianal area was cleaned a dressed with fluff gauze and ABD pad.     The patient was placed back in flat supine position, emerged from anesthesia, extubated, and brought to the recovery room for further management.  The patient tolerated the procedure well and all counts were correct at the end of the case.   Howie IllJose Luis Algenis Ballin, MD

## 2017-08-05 NOTE — Transfer of Care (Signed)
Immediate Anesthesia Transfer of Care Note  Patient: Wayne [name]  Procedure(s) Performed: EXAM UNDER ANESTHESIA WITH HEMORRHOIDECTOMY (N/A )  Patient Location: PACU  Anesthesia Type:General  Level of Consciousness: sedated  Airway & Oxygen Therapy: Patient Spontanous Breathing and Patient connected to face mask oxygen  Post-op Assessment: Report given to RN and Post -op Vital signs reviewed and stable  Post vital signs: Reviewed and stable  Last Vitals:  Vitals:   08/05/17 1057 08/05/17 1342  BP: (!) 148/88 137/79  Pulse: 82 87  Resp: 20 14  Temp: 36.4 C 36.7 C  SpO2: 100% 100%    Last Pain:  Vitals:   08/05/17 1057  TempSrc: Oral         Complications: No apparent anesthesia complications

## 2017-08-05 NOTE — Anesthesia Procedure Notes (Signed)
Procedure Name: LMA Insertion Date/Time: 08/05/2017 12:11 PM Performed by: Henrietta HooverPope, Sailor Hevia, CRNA Pre-anesthesia Checklist: Patient identified, Emergency Drugs available, Suction available, Patient being monitored and Timeout performed Patient Re-evaluated:Patient Re-evaluated prior to induction Oxygen Delivery Method: Circle system utilized Preoxygenation: Pre-oxygenation with 100% oxygen Induction Type: IV induction Ventilation: Mask ventilation without difficulty LMA: LMA inserted LMA Size: 5.0 Number of attempts: 1 Dental Injury: Teeth and Oropharynx as per pre-operative assessment

## 2017-08-05 NOTE — Interval H&P Note (Signed)
History and Physical Interval Note:  08/05/2017 11:32 AM  Wayne [name]  has presented today for surgery, with the diagnosis of Bleeding hemorrhoid  The various methods of treatment have been discussed with the patient and family. After consideration of risks, benefits and other options for treatment, the patient has consented to  Procedure(s) with comments: EXAM UNDER ANESTHESIA WITH HEMORRHOIDECTOMY (N/A) - Lithotomy position as a surgical intervention .  The patient's history has been reviewed, patient examined, no change in status, stable for surgery.  I have reviewed the patient's chart and labs.  Questions were answered to the patient's satisfaction.     Anjana Cheek

## 2017-08-05 NOTE — Discharge Instructions (Signed)
AMBULATORY SURGERY  °DISCHARGE INSTRUCTIONS ° ° °1) The drugs that you were given will stay in your system until tomorrow so for the next 24 hours you should not: ° °A) Drive an automobile °B) Make any legal decisions °C) Drink any alcoholic beverage ° ° °2) You may resume regular meals tomorrow.  Today it is better to start with liquids and gradually work up to solid foods. ° °You may eat anything you prefer, but it is better to start with liquids, then soup and crackers, and gradually work up to solid foods. ° ° °3) Please notify your doctor immediately if you have any unusual bleeding, trouble breathing, redness and pain at the surgery site, drainage, fever, or pain not relieved by medication. ° ° ° °4) Additional Instructions: ° ° ° ° ° ° ° °Please contact your physician with any problems or Same Day Surgery at [phone], Monday through Friday 6 am to 4 pm, or [place] [facility] [place] Main number at [phone]. °
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
## 2017-08-06 ENCOUNTER — Telehealth: Payer: Self-pay | Admitting: General Practice

## 2017-08-06 ENCOUNTER — Encounter: Payer: Self-pay | Admitting: Surgery

## 2017-08-06 ENCOUNTER — Other Ambulatory Visit: Payer: Self-pay

## 2017-08-06 LAB — SURGICAL PATHOLOGY

## 2017-08-06 NOTE — Telephone Encounter (Signed)
Contacted patient and he stated that he was in so much pain. He stated that he was discharged yesterday from having a hemorrhoidectomy and at night time he started to have a lot of pain. Patient stated that he has been taking the oxycodone every 6 hours as prescribed and has not touched the pain. I recommended for him to start taking Ibuprofen 800 MG every 8 hours to help with inflammation. I also recommended for him to do sitz baths several times a day for swelling. I also asked him about the shortness of breathe that he has been having. He stated that it's not shortness of breathe but when the pain gets intense, he just can't stand the pain. Patient also stated that he will ran out of pain medications soon and wanted to know how he could obtain more. I told him that we could see him on Monday at 9:30 AM to assess his pain. Patient agreed and understood. Patient stated that he would do what I had recommended for him to do.

## 2017-08-06 NOTE — Telephone Encounter (Signed)
Patients calling said he's in some pain, pain level being a 10. Patient has taken the oxycodone. Patient said he is having some shortness of breath. Please call patient and advise.

## 2017-08-06 NOTE — Anesthesia Postprocedure Evaluation (Signed)
Anesthesia Post Note  Patient: Wayne [name]  Procedure(s) Performed: EXAM UNDER ANESTHESIA WITH HEMORRHOIDECTOMY (N/A )  Patient location during evaluation: PACU Anesthesia Type: General Level of consciousness: awake and alert Pain management: pain level controlled Vital Signs Assessment: post-procedure vital signs reviewed and stable Respiratory status: spontaneous breathing, nonlabored ventilation, respiratory function stable and patient connected to nasal cannula oxygen Cardiovascular status: blood pressure returned to baseline and stable Postop Assessment: no apparent nausea or vomiting Anesthetic complications: no     Last Vitals:  Vitals:   08/05/17 1456 08/05/17 1526  BP: 134/82 131/78  Pulse: 77 75  Resp: 16 16  Temp: 36.7 C 36.7 C  SpO2: 96% 100%    Last Pain:  Vitals:   08/06/17 0917  TempSrc:   PainSc: 9                  Lenard SimmerAndrew Ethyle Tiedt

## 2017-08-10 ENCOUNTER — Other Ambulatory Visit: Payer: Self-pay | Admitting: Internal Medicine

## 2017-08-10 ENCOUNTER — Ambulatory Visit (INDEPENDENT_AMBULATORY_CARE_PROVIDER_SITE_OTHER): Payer: Self-pay | Admitting: General Surgery

## 2017-08-10 ENCOUNTER — Encounter: Payer: Self-pay | Admitting: General Surgery

## 2017-08-10 VITALS — BP 159/103 | HR 86 | Temp 98.1°F | Ht 70.0 in | Wt 275.0 lb

## 2017-08-10 DIAGNOSIS — Z4889 Encounter for other specified surgical aftercare: Secondary | ICD-10-CM

## 2017-08-10 DIAGNOSIS — Z8739 Personal history of other diseases of the musculoskeletal system and connective tissue: Secondary | ICD-10-CM

## 2017-08-10 MED ORDER — OXYCODONE HCL 5 MG PO TABS: 5.0000 mg | ORAL_TABLET | Freq: Four times a day (QID) | ORAL | 0 refills | Status: DC | PRN

## 2017-08-10 MED FILL — $COLCRYS 0.6 MG TABLET: 0.6 | 30 days supply | Qty: 30 | Fill #0

## 2017-08-10 MED FILL — AMLODIPINE BESYLATE 10 MG T: 10 | 30 days supply | Qty: 30 | Fill #4

## 2017-08-10 MED FILL — POLYETHYLENE GLYCOL 3350 PO: 7 days supply | Qty: 238 | Fill #4

## 2017-08-10 MED FILL — ?ALLOPURINOL 300 MG TABL: 300 | 30 days supply | Qty: 30 | Fill #1

## 2017-08-10 NOTE — Patient Instructions (Signed)
We will see you back as listed below:  STOP using the Tucks pads and change to baby wipes.  If you have any questions or concerns please give our office a call.
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
## 2017-08-13 ENCOUNTER — Encounter: Payer: Self-pay | Admitting: Surgery

## 2017-08-13 ENCOUNTER — Ambulatory Visit (INDEPENDENT_AMBULATORY_CARE_PROVIDER_SITE_OTHER): Payer: Self-pay | Admitting: Surgery

## 2017-08-13 ENCOUNTER — Ambulatory Visit
Admission: RE | Admit: 2017-08-13 | Discharge: 2017-08-13 | Disposition: A | Discharge status: Home / Self Care | Payer: No Typology Code available for payment source | Source: Ambulatory Visit | Attending: Orthopedic Surgery | Admitting: Orthopedic Surgery

## 2017-08-13 DIAGNOSIS — Z09 Encounter for follow-up examination after completed treatment for conditions other than malignant neoplasm: Secondary | ICD-10-CM

## 2017-08-13 DIAGNOSIS — M25572 Pain in left ankle and joints of left foot: Secondary | ICD-10-CM

## 2017-08-13 MED ORDER — OXYCODONE HCL 5 MG PO TABS: 5.0000 mg | ORAL_TABLET | Freq: Four times a day (QID) | ORAL | 0 refills | Status: DC | PRN

## 2017-08-13 MED ORDER — LIDOCAINE (ANORECTAL) 5 % EX CREA: 1.0000 "application " | TOPICAL_CREAM | Freq: Every day | CUTANEOUS | 0 refills | Status: DC | PRN

## 2017-08-13 NOTE — Patient Instructions (Addendum)
Start using the Lidocaine ointment up to 5 times daily as needed for pain. You may also increase your Miralax to twice daily or use a stool softer in addition to the Miralax that you are currently taking. We also advise that you increase your water intake as well.   We will see you back as listed below:  If you h ave any questions or concerns please give our office a call.

## 2017-08-13 NOTE — Progress Notes (Signed)
08/13/2017  HPI: Patient is status post exam under anesthesia and hemorrhoidectomy of 3 hemorrhoidal columns on 2/27.  He presented to the office on 3/4 due to worsening pain in the perianal area.  It appears that the local anesthetic infiltrated during surgery only lasted for 2 days.  He was given a refill prescription for his pain medication.  He reports that he is somewhat more comfortable from the pain control standpoint, but he still having issues.  He reports that there is been some mild amount of blood with bowel movements and that the stool is still hard although he has 2-3 bowel movements per day.  He feels sometimes that there is still stool remaining and feels an urge to continue pushing.  He has not been doing sitz bath's uses his shower to wash his anal area after bowel movements.  He thought that using Tucks pads would help but in fact this was irritating things more.  He transition to baby wipes to help clean instead.  Vital signs: There were no vitals taken for this visit.   Physical Exam: Constitutional: No acute distress Rectal: It appears that the incision for all 3 hemorrhoidal columns has have somewhat open given the inflammation that he had during the surgery.  Also there is no bleeding noticeable no other lesions or masses.  No protruding hemorrhoidal tissue either.  Assessment/Plan: 42 year old male status post exam under anesthesia and hemorrhoidectomy.  -Discussed with the patient that likely his wounds have open despite of LigaSure and suture overlap due to his stool still being hard and having to strain to push him through.  Discussed the patient that he needs to soften his stools more.  He is currently taking MiraLAX once daily and psyllium once daily as well.  This was his regimen before surgery which was not working so discussed with the patient that he should do MiraLAX twice daily and drink more water to help improve the softness of his stools.  If he notices that the  MiraLAX is not working that he can try a stool softener such as Colace.  He also has been instructed that he needs to do sitz bath twice daily to help soothe the perianal tissue as well.  We will also give him a prescription for lidocaine 5% ointment that he can apply to the perianal area as needed for pain.  He can also continue taking oxycodone for pain although he understands that this can cause constipation.  We will refill his pain prescription.  He will follow up in the office in 2 weeks to assess his symptoms and how he is improving.   Howie IllJose Luis Jace Fermin, MD [facility]

## 2017-08-17 ENCOUNTER — Telehealth: Payer: Self-pay | Admitting: Surgery

## 2017-08-17 NOTE — Telephone Encounter (Signed)
Called patient back and he stated that he is still in a lot of rectal pain. He stated that he wanted more pain medication because he is out of them. I asked him if he had done Dr. Adelene IdlerPiscoya's recommendations when he was here last on 08/13/2017. Patient stated that he has tried but nothing is helping. I asked him if he was doing the sitz baths, Miralax, Lidocaine Ointment twice a day and taking Colace. He stated that he was not doing the Miralax, sitz baths and Lidocaine ointment twice a day. I told him that he should do these recommendations twice a day and to take Ibuprofen 800 MG every 6 hours as needed for pain. I also told him that I would speak with our surgeon and ask for further recommendations. Patient understood.  I spoke with Dr. Excell Seltzerooper and he stated that he should do what Dr. Aleen CampiPiscoya and I had recommended. Unfortunately, he would not prescribe him more pain medication. He also suggested to go to the ED for pain control if needed to.  I then called patient to let him know what Dr. Excell Seltzerooper recommended and patient stated that he understood and that he would go to the ED if none of this helps.

## 2017-08-17 NOTE — Telephone Encounter (Signed)
Patient is calling said he is in some pain, pain level being a ten when he goes to the bathroom, less pain when he doesn't have to use the bathroom, he said he is having a lot more blood, please call patient and advise.

## 2017-08-19 ENCOUNTER — Ambulatory Visit (INDEPENDENT_AMBULATORY_CARE_PROVIDER_SITE_OTHER): Payer: Self-pay | Admitting: Orthopedic Surgery

## 2017-08-19 ENCOUNTER — Encounter (INDEPENDENT_AMBULATORY_CARE_PROVIDER_SITE_OTHER): Payer: Self-pay | Admitting: Orthopedic Surgery

## 2017-08-19 DIAGNOSIS — M25572 Pain in left ankle and joints of left foot: Secondary | ICD-10-CM

## 2017-08-22 ENCOUNTER — Encounter (INDEPENDENT_AMBULATORY_CARE_PROVIDER_SITE_OTHER): Payer: Self-pay | Admitting: Orthopedic Surgery
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
## 2017-08-24 ENCOUNTER — Ambulatory Visit (INDEPENDENT_AMBULATORY_CARE_PROVIDER_SITE_OTHER): Payer: Self-pay | Admitting: Surgery

## 2017-08-24 ENCOUNTER — Encounter: Payer: Self-pay | Admitting: Surgery

## 2017-08-24 VITALS — BP 129/91 | HR 83 | Temp 97.6°F | Wt 278.0 lb

## 2017-08-24 DIAGNOSIS — Z09 Encounter for follow-up examination after completed treatment for conditions other than malignant neoplasm: Secondary | ICD-10-CM

## 2017-08-24 NOTE — Progress Notes (Signed)
08/24/2017  HPI: Patient is s/p hemorrhoidectomy of three columns on 2/27.  He presents for further follow up.  He reports that he's still having some pain although it is much better than before.  Now he actually can be comfortable and gets pain more with the bowel movements.  He still feels that he has to strain despite of increasing miralax to twice daily.  He has been using lidocaine ointment which helps.  He has not done sitz baths but has used warm compresses.  He notes occasional bleeding but definitely improved compared to preop.  Vital signs: BP (!) 129/91   Pulse 83   Temp 97.6 F (36.4 C) (Oral)   Wt 126.1 kg (278 lb)   BMI 39.89 kg/m    Physical Exam: Constitutional: No acute distress Rectal:  External exam reveals mucous/stool discharge from anal opening.  The external component of the hemorrhoidectomy still reveals open wounds, which are healing, without evidence of infection and with appropriate granulation tissue.  There is no bleeding noted, no induration, fluctuance, or erythema.  One vicryl suture segment was cut.  Assessment/Plan: 42 yo male s/p hemorrhoidectomy of 3 columns.  Patient's pain continues to improve which is reassuring, though I suspect that if he's still straining and having an unclean anal verge, there's bound to be some discomfort at the open wounds.  Discussed with him that he should add a stool softener to help with his bowels as well.  He should do sitz baths, and if not able, to use the shower head to rinse the perianal area well after each bowel movement.  He can otherwise resume his workouts and activities.  He can continue using lidocaine as needed.  He will follow up in a month, but knows return precautions and reasons to call back beforehand.   Wayne IllJose Luis Raiyah Speakman, MD [facility]

## 2017-08-24 NOTE — Patient Instructions (Signed)
Please try to do the sitz baths after you have a bowel movement to keep the area clean.  Please continue with the Miralax twice a day.  You are able to add a stool softener once a day.  Continue with your fiber intake.  We will see you back in a month to make sure that you are doing better.

## 2017-08-25 ENCOUNTER — Encounter (INDEPENDENT_AMBULATORY_CARE_PROVIDER_SITE_OTHER): Payer: Self-pay | Admitting: Orthopedic Surgery

## 2017-08-25 ENCOUNTER — Ambulatory Visit (INDEPENDENT_AMBULATORY_CARE_PROVIDER_SITE_OTHER): Payer: Self-pay | Admitting: Orthopedic Surgery

## 2017-08-25 VITALS — Ht 70.0 in | Wt 278.0 lb

## 2017-08-25 DIAGNOSIS — M958 Other specified acquired deformities of musculoskeletal system: Secondary | ICD-10-CM

## 2017-08-25 HISTORY — DX: Other specified acquired deformities of musculoskeletal system: M95.8
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
## 2017-08-26 ENCOUNTER — Inpatient Hospital Stay: Payer: Self-pay | Admitting: Surgery

## 2017-09-15 MED FILL — ?ALLOPURINOL 300 MG TABL: 300 | 30 days supply | Qty: 30 | Fill #2

## 2017-09-15 MED FILL — $COLCRYS 0.6 MG TABLET: 0.6 | 30 days supply | Qty: 30 | Fill #1

## 2017-09-15 MED FILL — AMLODIPINE BESYLATE 10 MG T: 10 | 30 days supply | Qty: 30 | Fill #5

## 2017-09-28 ENCOUNTER — Encounter: Payer: Self-pay | Admitting: Surgery

## 2017-09-28 ENCOUNTER — Ambulatory Visit (INDEPENDENT_AMBULATORY_CARE_PROVIDER_SITE_OTHER): Payer: Self-pay | Admitting: Surgery

## 2017-09-28 VITALS — BP 119/83 | HR 67 | Temp 97.6°F | Ht 70.0 in | Wt 259.2 lb

## 2017-09-28 DIAGNOSIS — K439 Ventral hernia without obstruction or gangrene: Secondary | ICD-10-CM

## 2017-09-28 DIAGNOSIS — K648 Other hemorrhoids: Secondary | ICD-10-CM

## 2017-09-28 DIAGNOSIS — Z09 Encounter for follow-up examination after completed treatment for conditions other than malignant neoplasm: Secondary | ICD-10-CM

## 2017-09-28 NOTE — Progress Notes (Signed)
09/28/2017  HPI: Patient is s/p hemorrhoidectomy on 2/26.  He presents for follow up and to discuss his ventral hernia.  From the hemorrhoid standpoint, patient reports pain only 1 of 10.  He reports his stools are better, though he does strain a bit sometimes.  He has noticed some very small amount of blood, particularly when he's straining.  He continues taking Miralax and Psyllium twice daily.  From the ventral hernia standpoint, patient reports that he has frequent abdominal pain symptoms and uses a belt to try to decrease the pressure on the hernia.  Denies any obstructive symptoms from it, but does feel that he's having more frequent pain.  Vital signs: BP 119/83   Pulse 67   Temp 97.6 F (36.4 C) (Oral)   Ht 5' 10" (1.778 m)   Wt 259 lb 3.2 oz (117.6 kg)   BMI 37.19 kg/m    Physical Exam: Constitutional: No acute distress Abdomen:  Soft, nondistended, nontender to palpation.  Patient has a supraumbilical ventral defect measuring about 7 cm length by 5 cm wide, easily reducible. Rectal:  No palpable enlarged hemorrhoidal tissue, no blood, no open wounds, infection, erythema, or fissure.  Assessment/Plan: 41 yo male s/p hemorrhoidectomy, also with a ventral hernia.  Discussed with the patient that we can do a laparoscopic ventral hernia repair with mesh.  Discussed the steps of the surgery, and risks including risk of bleeding, infection, injury to surrounding structures.  He is willing to proceed.  We will schedule him for 5/9 for laparoscopic ventral hernia repair with mesh.  From the hemorrhoid standpoint, no further surgical needs at this time.  Recommended that he continue with a bowel regimen.  If he notices that this is not helping enough, he will let us know so we can send a referral to GI for chronic constipation.   Wayne Koffman Luis Nathyn Luiz, MD [facility]  

## 2017-09-28 NOTE — Patient Instructions (Addendum)
Please continue the high fiber diet and be sure to drink plenty of fluids.  If you would like a referral to GI please let us know and we can send this for you.  Please see your blue pre-care sheet for surgery information.  Please call if you have questions or concerns.  Ventral Hernia A ventral hernia is a bulge of tissue from inside the abdomen that pushes through a weak area of the muscles that form the front wall of the abdomen. The tissues inside the abdomen are inside a sac (peritoneum). These tissues include the small intestine, large intestine, and the fatty tissue that covers the intestines (omentum). Sometimes, the bulge that forms a hernia contains intestines. Other hernias contain only fat. Ventral hernias do not go away without surgical treatment. There are several types of ventral hernias. You may have:  A hernia at an incision site from previous abdominal surgery (incisional hernia).  A hernia just above the belly button (epigastric hernia), or at the belly button (umbilical hernia). These types of hernias can develop from heavy lifting or straining.  A hernia that comes and goes (reducible hernia). It may be visible only when you lift or strain. This type of hernia can be pushed back into the abdomen (reduced).  A hernia that traps abdominal tissue inside the hernia (incarcerated hernia). This type of hernia does not reduce.  A hernia that cuts off blood flow to the tissues inside the hernia (strangulated hernia). The tissues can start to die if this happens. This is a very painful bulge that cannot be reduced. A strangulated hernia is a medical emergency.  What are the causes? This condition is caused by abdominal tissue putting pressure on an area of weakness in the abdominal muscles. What increases the risk? The following factors may make you more likely to develop this condition:  Being male.  Being 40 or older.  Being overweight or obese.  Having had previous  abdominal surgery, especially if there was an infection after surgery.  Having had an injury to the abdominal wall.  Having had several pregnancies.  Having a buildup of fluid inside the abdomen (ascites).  What are the signs or symptoms? The only symptom of a ventral hernia may be a painless bulge in the abdomen. A reducible hernia may be visible only when you strain, cough, or lift. Other symptoms may include:  Dull pain.  A feeling of pressure.  Signs and symptoms of a strangulated hernia may include:  Increasing pain.  Nausea and vomiting.  Pain when pressing on the hernia.  The skin over the hernia turning red or purple.  Constipation.  Blood in the stool (feces).  How is this diagnosed? This condition may be diagnosed based on:  Your symptoms.  Your medical history.  A physical exam. You may be asked to cough or strain while standing. These actions increase the pressure inside your abdomen and force the hernia through the opening in your muscles. Your health care provider may try to reduce the hernia by pressing on it.  Imaging studies, such as an ultrasound or CT scan.  How is this treated? This condition is treated with surgery. If you have a strangulated hernia, surgery is done as soon as possible. If your hernia is small and not incarcerated, you may be asked to lose some weight before surgery. Follow these instructions at home:  Follow instructions from your health care provider about eating or drinking restrictions.  If you are overweight, your health  care provider may recommend that you increase your activity level and eat a healthier diet.  Do not lift anything that is heavier than 10 lb (4.5 kg).  Return to your normal activities as told by your health care provider. Ask your health care provider what activities are safe for you. You may need to avoid activities that increase pressure on your hernia.  Take over-the-counter and prescription medicines  only as told by your health care provider.  Keep all follow-up visits as told by your health care provider. This is important. Contact a health care provider if:  Your hernia gets larger.  Your hernia becomes painful. Get help right away if:  Your hernia becomes increasingly painful.  You have pain along with any of the following: ? Changes in skin color in the area of the hernia. ? Nausea. ? Vomiting. ? Fever. Summary  A ventral hernia is a bulge of tissue from inside the abdomen that pushes through a weak area of the muscles that form the front wall of the abdomen.  This condition is treated with surgery, which may be urgent depending on your hernia.  Do not lift anything that is heavier than 10 lb (4.5 kg), and follow activity instructions from your health care provider. This information is not intended to replace advice given to you by your health care provider. Make sure you discuss any questions you have with your health care provider. Document Released: 05/12/2012 Document Revised: 01/11/2016 Document Reviewed: 01/11/2016 Elsevier Interactive Patient Education  [employer]2018 [employer]

## 2017-09-28 NOTE — H&P (View-Only) (Signed)
09/28/2017  HPI: Patient is s/p hemorrhoidectomy on 2/26.  He presents for follow up and to discuss his ventral hernia.  From the hemorrhoid standpoint, patient reports pain only 1 of 10.  He reports his stools are better, though he does strain a bit sometimes.  He has noticed some very small amount of blood, particularly when he's straining.  He continues taking Miralax and Psyllium twice daily.  From the ventral hernia standpoint, patient reports that he has frequent abdominal pain symptoms and uses a belt to try to decrease the pressure on the hernia.  Denies any obstructive symptoms from it, but does feel that he's having more frequent pain.  Vital signs: BP 119/83   Pulse 67   Temp 97.6 F (36.4 C) (Oral)   Ht 5\' 10"  (1.778 m)   Wt 259 lb 3.2 oz (117.6 kg)   BMI 37.19 kg/m    Physical Exam: Constitutional: No acute distress Abdomen:  Soft, nondistended, nontender to palpation.  Patient has a supraumbilical ventral defect measuring about 7 cm length by 5 cm wide, easily reducible. Rectal:  No palpable enlarged hemorrhoidal tissue, no blood, no open wounds, infection, erythema, or fissure.  Assessment/Plan: 42 yo male s/p hemorrhoidectomy, also with a ventral hernia.  Discussed with the patient that we can do a laparoscopic ventral hernia repair with mesh.  Discussed the steps of the surgery, and risks including risk of bleeding, infection, injury to surrounding structures.  He is willing to proceed.  We will schedule him for 5/9 for laparoscopic ventral hernia repair with mesh.  From the hemorrhoid standpoint, no further surgical needs at this time.  Recommended that he continue with a bowel regimen.  If he notices that this is not helping enough, he will let us know so we can send a referral to GI for chronic constipation.   Howie IllJose Luis Marston Mccadden, MD [facility]

## 2017-09-29 ENCOUNTER — Telehealth: Payer: Self-pay | Admitting: Surgery

## 2017-09-29 NOTE — Telephone Encounter (Signed)
Pt advised of pre op date/time and sx date. Sx: 10/15/17 with Dr Piscoya-laparoscopic ventral hernia repair with mesh.  Pre op: 10/08/17 between 9-1:00pm-phone interview.   Patient made aware to call [phone], between 1-3:00pm the day before surgery, to find out what time to arrive.

## 2017-10-08 ENCOUNTER — Encounter
Admission: RE | Admit: 2017-10-08 | Discharge: 2017-10-08 | Disposition: A | Discharge status: Home / Self Care | Payer: No Typology Code available for payment source | Source: Ambulatory Visit | Attending: Surgery | Admitting: Surgery

## 2017-10-08 ENCOUNTER — Other Ambulatory Visit: Payer: Self-pay

## 2017-10-08 NOTE — Patient Instructions (Signed)
Your procedure is scheduled on:10/15/17 Report to Day Surgery. To find out your arrival time please call [phone] between 1PM - 3PM on Wed. 10/14/17.  Remember: Instructions that are not followed completely may result in serious medical risk, up to and including death, or upon the discretion of your surgeon and anesthesiologist your surgery may need to be rescheduled.     _X__ 1. Do not eat food after midnight the night before your procedure.                 No gum chewing or hard candies. You may drink clear liquids up to 2 hours                 before you are scheduled to arrive for your surgery- DO not drink clear                 liquids within 2 hours of the start of your surgery.                 Clear Liquids include:  water, apple juice without pulp, clear carbohydrate                 drink such as Clearfast of Gartorade, Black Coffee or Tea (Do not add                 anything to coffee or tea).  __X__2.  On the morning of surgery brush your teeth with toothpaste and water, you  may rinse your mouth with mouthwash if you wish.  Do not swallow any              toothpaste of mouthwash.     _X__ 3.  No Alcohol for 24 hours before or after surgery.   ___ 4.  Do Not Smoke or use e-cigarettes For 24 Hours Prior to Your Surgery.                 Do not use any chewable tobacco products for at least 6 hours prior to                 surgery.  ____  5.  Bring all medications with you on the day of surgery if instructed.   _x___  6.  Notify your doctor if there is any change in your medical condition      (cold, fever, infections).     Do not wear jewelry, make-up, hairpins, clips or nail polish. Do not wear lotions, powders, or perfumes. You may wear deodorant. Do not shave 48 hours prior to surgery. Men may shave face and neck. Do not bring valuables to the hospital.    [facility] is not responsible for any belongings or valuables.  Contacts, dentures or  bridgework may not be worn into surgery. Leave your suitcase in the car. After surgery it may be brought to your room. For patients admitted to the hospital, discharge time is determined by your treatment team.   Patients discharged the day of surgery will not be allowed to drive home.   Please read over the following fact sheets that you were given:     __x__ Take these medicines the morning of surgery with A SIP OF WATER:    1. allopurinol (ZYLOPRIM) 300 MG tablet  2. amLODipine (NORVASC) 10 MG tablet  3. COLCRYS 0.6 MG tablet  4.  5.  6.  ____ Fleet Enema (as directed)   ____ Use CHG Soap as directed  ____ Use inhalers on the day of surgery  ____ Stop metformin 2 days prior to surgery    ____ Take 1/2 of usual insulin dose the night before surgery. No insulin the morning          of surgery.   ____ Stop Coumadin/Plavix/aspirin on   __x__ Stop Anti-inflammatories on Ibuprofen.  May take tylenol   __x__ Stop supplements until after surgery Fish Oil    ____ Bring C-Pap to the hospital.

## 2017-10-12 ENCOUNTER — Encounter: Payer: Self-pay | Admitting: Internal Medicine

## 2017-10-12 ENCOUNTER — Ambulatory Visit: Payer: No Typology Code available for payment source | Attending: Internal Medicine | Admitting: Internal Medicine

## 2017-10-12 VITALS — BP 121/85 | HR 79 | Temp 98.3°F | Resp 16 | Wt 270.0 lb

## 2017-10-12 DIAGNOSIS — Z9889 Other specified postprocedural states: Secondary | ICD-10-CM | POA: Insufficient documentation

## 2017-10-12 DIAGNOSIS — K429 Umbilical hernia without obstruction or gangrene: Secondary | ICD-10-CM | POA: Insufficient documentation

## 2017-10-12 DIAGNOSIS — L739 Follicular disorder, unspecified: Secondary | ICD-10-CM | POA: Insufficient documentation

## 2017-10-12 DIAGNOSIS — L309 Dermatitis, unspecified: Secondary | ICD-10-CM | POA: Insufficient documentation

## 2017-10-12 DIAGNOSIS — Z880 Allergy status to penicillin: Secondary | ICD-10-CM | POA: Insufficient documentation

## 2017-10-12 DIAGNOSIS — Z882 Allergy status to sulfonamides status: Secondary | ICD-10-CM | POA: Insufficient documentation

## 2017-10-12 DIAGNOSIS — I10 Essential (primary) hypertension: Secondary | ICD-10-CM

## 2017-10-12 DIAGNOSIS — Z881 Allergy status to other antibiotic agents status: Secondary | ICD-10-CM | POA: Insufficient documentation

## 2017-10-12 DIAGNOSIS — Z79899 Other long term (current) drug therapy: Secondary | ICD-10-CM | POA: Insufficient documentation

## 2017-10-12 MED ORDER — TRIAMCINOLONE ACETONIDE 0.1 % EX CREA
1.0000 | TOPICAL_CREAM | Freq: Two times a day (BID) | CUTANEOUS | 0 refills | Status: DC
Start: 2017-10-12 — End: 2017-12-15
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
## 2017-10-12 NOTE — Progress Notes (Signed)
Pt states the cream that was prescribed to him last time is not working and is requesting something different

## 2017-10-13 ENCOUNTER — Other Ambulatory Visit: Payer: Self-pay

## 2017-10-13 ENCOUNTER — Ambulatory Visit: Payer: Self-pay | Attending: Internal Medicine

## 2017-10-13 DIAGNOSIS — I10 Essential (primary) hypertension: Secondary | ICD-10-CM | POA: Insufficient documentation

## 2017-10-13 MED ORDER — POLYETHYLENE GLYCOL 3350 17 GM/SCOOP PO POWD: ORAL | 3 refills | Status: DC

## 2017-10-13 MED FILL — TRIAMCINOLONE ACETONIDE 0.1: 0.1 | 15 days supply | Qty: 30 | Fill #0

## 2017-10-13 MED FILL — ALLOPURINOL 300 MG TAB: 300 | 30 days supply | Qty: 30 | Fill #3

## 2017-10-13 MED FILL — POLYETHYLENE GLYCOL 3350 PO: 15 days supply | Qty: 510 | Fill #0

## 2017-10-13 MED FILL — $COLCRYS 0.6 MG TABLET: 0.6 | 30 days supply | Qty: 30 | Fill #2

## 2017-10-13 MED FILL — AMLODIPINE BESYLATE 10 MG T: 10 | 30 days supply | Qty: 30 | Fill #6

## 2017-10-13 NOTE — Progress Notes (Signed)
Patient here for lab visit  

## 2017-10-14 LAB — COMPREHENSIVE METABOLIC PANEL
ALT: 31 IU/L (ref 0–44)
AST: 31 IU/L (ref 0–40)
Albumin/Globulin Ratio: 1.6 (ref 1.2–2.2)
Albumin: 4.4 g/dL (ref 3.5–5.5)
Alkaline Phosphatase: 65 IU/L (ref 39–117)
BUN/Creatinine Ratio: 10 (ref 9–20)
BUN: 11 mg/dL (ref 6–24)
Bilirubin Total: 0.9 mg/dL (ref 0.0–1.2)
CO2: 21 mmol/L (ref 20–29)
Calcium: 9.5 mg/dL (ref 8.7–10.2)
Chloride: 105 mmol/L (ref 96–106)
Creatinine, Ser: 1.11 mg/dL (ref 0.76–1.27)
GFR calc Af Amer: 94 mL/min/{1.73_m2} (ref >59)
GFR calc non Af Amer: 81 mL/min/{1.73_m2} (ref >59)
Globulin, Total: 2.8 g/dL (ref 1.5–4.5)
Glucose: 86 mg/dL (ref 65–99)
Potassium: 4.6 mmol/L (ref 3.5–5.2)
Sodium: 144 mmol/L (ref 134–144)
Total Protein: 7.2 g/dL (ref 6.0–8.5)

## 2017-10-15 ENCOUNTER — Ambulatory Visit: Payer: Self-pay | Admitting: Certified Registered"

## 2017-10-15 ENCOUNTER — Encounter
Admission: RE | Disposition: A | Discharge status: Home / Self Care | Payer: Self-pay | Source: Ambulatory Visit | Attending: Surgery

## 2017-10-15 ENCOUNTER — Ambulatory Visit
Admission: RE | Admit: 2017-10-15 | Discharge: 2017-10-15 | Disposition: A | Discharge status: Home / Self Care | Payer: Self-pay | Source: Ambulatory Visit | Attending: Surgery | Admitting: Surgery

## 2017-10-15 ENCOUNTER — Other Ambulatory Visit: Payer: Self-pay

## 2017-10-15 ENCOUNTER — Encounter: Payer: Self-pay | Admitting: Certified Registered"

## 2017-10-15 DIAGNOSIS — K219 Gastro-esophageal reflux disease without esophagitis: Secondary | ICD-10-CM | POA: Insufficient documentation

## 2017-10-15 DIAGNOSIS — Z6838 Body mass index (BMI) 38.0-38.9, adult: Secondary | ICD-10-CM | POA: Insufficient documentation

## 2017-10-15 DIAGNOSIS — Z79899 Other long term (current) drug therapy: Secondary | ICD-10-CM | POA: Insufficient documentation

## 2017-10-15 DIAGNOSIS — K439 Ventral hernia without obstruction or gangrene: Secondary | ICD-10-CM | POA: Insufficient documentation

## 2017-10-15 DIAGNOSIS — I10 Essential (primary) hypertension: Secondary | ICD-10-CM | POA: Insufficient documentation

## 2017-10-15 HISTORY — PX: INSERTION OF MESH: SHX5868

## 2017-10-15 HISTORY — PX: VENTRAL HERNIA REPAIR: SHX424

## 2017-10-15 LAB — SURGICAL PCR SCREEN
MRSA, PCR: NEGATIVE
Staphylococcus aureus: NEGATIVE

## 2017-10-15 SURGERY — REPAIR, HERNIA, VENTRAL, LAPAROSCOPIC
Anesthesia: General | Wound class: Clean

## 2017-10-15 MED ORDER — ACETAMINOPHEN 10 MG/ML IV SOLN
INTRAVENOUS | Status: AC
  Filled 2017-10-15: qty 100

## 2017-10-15 MED ORDER — FENTANYL CITRATE (PF) 100 MCG/2ML IJ SOLN
INTRAMUSCULAR | Status: AC
  Filled 2017-10-15: qty 2

## 2017-10-15 MED ORDER — PROPOFOL 10 MG/ML IV BOLUS
INTRAVENOUS | Status: AC
  Filled 2017-10-15: qty 20

## 2017-10-15 MED ORDER — OXYCODONE HCL 5 MG PO TABS: 5.0000 mg | ORAL_TABLET | Freq: Four times a day (QID) | ORAL | 0 refills | Status: DC | PRN

## 2017-10-15 MED ORDER — GLYCOPYRROLATE 0.2 MG/ML IJ SOLN
INTRAMUSCULAR | Status: AC
  Filled 2017-10-15: qty 1

## 2017-10-15 MED ORDER — GABAPENTIN 300 MG PO CAPS
ORAL_CAPSULE | ORAL | Status: AC
  Administered 2017-10-15: 300 mg via ORAL
  Filled 2017-10-15: qty 1

## 2017-10-15 MED ORDER — ACETAMINOPHEN 500 MG PO TABS
ORAL_TABLET | ORAL | Status: AC
  Administered 2017-10-15: 1000 mg via ORAL
  Filled 2017-10-15: qty 2

## 2017-10-15 MED ORDER — CLINDAMYCIN PHOSPHATE 900 MG/50ML IV SOLN
900.0000 mg | INTRAVENOUS | Status: AC
  Administered 2017-10-15: 900 mg via INTRAVENOUS

## 2017-10-15 MED ORDER — BUPIVACAINE-EPINEPHRINE (PF) 0.25% -1:200000 IJ SOLN
INTRAMUSCULAR | Status: AC
  Filled 2017-10-15: qty 30

## 2017-10-15 MED ORDER — IBUPROFEN 600 MG PO TABS: 600.0000 mg | ORAL_TABLET | Freq: Three times a day (TID) | ORAL | 0 refills | Status: DC | PRN

## 2017-10-15 MED ORDER — FENTANYL CITRATE (PF) 100 MCG/2ML IJ SOLN: 25.0000 ug | INTRAMUSCULAR | Status: DC | PRN

## 2017-10-15 MED ORDER — ACETAMINOPHEN 500 MG PO TABS
1000.0000 mg | ORAL_TABLET | ORAL | Status: AC
  Administered 2017-10-15: 1000 mg via ORAL

## 2017-10-15 MED ORDER — MIDAZOLAM HCL 2 MG/2ML IJ SOLN
INTRAMUSCULAR | Status: AC
  Filled 2017-10-15: qty 2

## 2017-10-15 MED ORDER — DEXAMETHASONE SODIUM PHOSPHATE 10 MG/ML IJ SOLN
INTRAMUSCULAR | Status: DC | PRN
  Administered 2017-10-15: 10 mg via INTRAVENOUS

## 2017-10-15 MED ORDER — ONDANSETRON HCL 4 MG/2ML IJ SOLN: 4.0000 mg | Freq: Once | INTRAMUSCULAR | Status: DC | PRN

## 2017-10-15 MED ORDER — GLYCOPYRROLATE 0.2 MG/ML IJ SOLN
INTRAMUSCULAR | Status: DC | PRN
  Administered 2017-10-15 (×2): 0.1 mg via INTRAVENOUS

## 2017-10-15 MED ORDER — DEXAMETHASONE SODIUM PHOSPHATE 10 MG/ML IJ SOLN
INTRAMUSCULAR | Status: AC
  Filled 2017-10-15: qty 1

## 2017-10-15 MED ORDER — BUPIVACAINE-EPINEPHRINE 0.5% -1:200000 IJ SOLN
INTRAMUSCULAR | Status: DC | PRN
  Administered 2017-10-15: 30 mL

## 2017-10-15 MED ORDER — CHLORHEXIDINE GLUCONATE CLOTH 2 % EX PADS
6.0000 | MEDICATED_PAD | Freq: Once | CUTANEOUS | Status: AC
  Administered 2017-10-15: 6 via TOPICAL

## 2017-10-15 MED ORDER — PHENYLEPHRINE HCL 10 MG/ML IJ SOLN
INTRAMUSCULAR | Status: DC | PRN
  Administered 2017-10-15 (×4): 100 ug via INTRAVENOUS

## 2017-10-15 MED ORDER — BUPIVACAINE LIPOSOME 1.3 % IJ SUSP
INTRAMUSCULAR | Status: AC
  Filled 2017-10-15: qty 20

## 2017-10-15 MED ORDER — HYDROMORPHONE HCL 1 MG/ML IJ SOLN
INTRAMUSCULAR | Status: AC
  Filled 2017-10-15: qty 1

## 2017-10-15 MED ORDER — LACTATED RINGERS IV SOLN
INTRAVENOUS | Status: DC
  Administered 2017-10-15 (×2): via INTRAVENOUS

## 2017-10-15 MED ORDER — MIDAZOLAM HCL 2 MG/2ML IJ SOLN
INTRAMUSCULAR | Status: DC | PRN
  Administered 2017-10-15: 2 mg via INTRAVENOUS

## 2017-10-15 MED ORDER — PROPOFOL 10 MG/ML IV BOLUS
INTRAVENOUS | Status: DC | PRN
  Administered 2017-10-15: 200 mg via INTRAVENOUS

## 2017-10-15 MED ORDER — KETOROLAC TROMETHAMINE 30 MG/ML IJ SOLN
INTRAMUSCULAR | Status: AC
  Filled 2017-10-15: qty 1

## 2017-10-15 MED ORDER — KETOROLAC TROMETHAMINE 30 MG/ML IJ SOLN
INTRAMUSCULAR | Status: DC | PRN
  Administered 2017-10-15: 30 mg via INTRAVENOUS

## 2017-10-15 MED ORDER — CHLORHEXIDINE GLUCONATE CLOTH 2 % EX PADS: 6.0000 | MEDICATED_PAD | Freq: Once | CUTANEOUS | Status: DC

## 2017-10-15 MED ORDER — SUGAMMADEX SODIUM 200 MG/2ML IV SOLN
INTRAVENOUS | Status: AC
  Filled 2017-10-15: qty 4

## 2017-10-15 MED ORDER — FAMOTIDINE 20 MG PO TABS
20.0000 mg | ORAL_TABLET | Freq: Once | ORAL | Status: AC
  Administered 2017-10-15: 20 mg via ORAL

## 2017-10-15 MED ORDER — ROCURONIUM BROMIDE 100 MG/10ML IV SOLN
INTRAVENOUS | Status: DC | PRN
  Administered 2017-10-15: 10 mg via INTRAVENOUS
  Administered 2017-10-15: 50 mg via INTRAVENOUS

## 2017-10-15 MED ORDER — ONDANSETRON HCL 4 MG/2ML IJ SOLN
INTRAMUSCULAR | Status: DC | PRN
  Administered 2017-10-15: 4 mg via INTRAVENOUS

## 2017-10-15 MED ORDER — FENTANYL CITRATE (PF) 100 MCG/2ML IJ SOLN
INTRAMUSCULAR | Status: DC | PRN
  Administered 2017-10-15: 100 ug via INTRAVENOUS

## 2017-10-15 MED ORDER — GABAPENTIN 300 MG PO CAPS
300.0000 mg | ORAL_CAPSULE | ORAL | Status: AC
  Administered 2017-10-15: 300 mg via ORAL

## 2017-10-15 MED ORDER — CLINDAMYCIN PHOSPHATE 900 MG/50ML IV SOLN
INTRAVENOUS | Status: AC
  Filled 2017-10-15: qty 50

## 2017-10-15 MED ORDER — SODIUM CHLORIDE FLUSH 0.9 % IV SOLN
INTRAVENOUS | Status: AC
  Filled 2017-10-15: qty 50

## 2017-10-15 MED ORDER — SEVOFLURANE IN SOLN
RESPIRATORY_TRACT | Status: AC
  Filled 2017-10-15: qty 250

## 2017-10-15 MED ORDER — ACETAMINOPHEN 10 MG/ML IV SOLN
INTRAVENOUS | Status: DC | PRN
  Administered 2017-10-15: 1000 mg via INTRAVENOUS

## 2017-10-15 MED ORDER — LIDOCAINE HCL (CARDIAC) PF 100 MG/5ML IV SOSY
PREFILLED_SYRINGE | INTRAVENOUS | Status: DC | PRN
  Administered 2017-10-15: 50 mg via INTRAVENOUS

## 2017-10-15 MED ORDER — LIDOCAINE HCL (PF) 2 % IJ SOLN
INTRAMUSCULAR | Status: AC
  Filled 2017-10-15: qty 10

## 2017-10-15 MED ORDER — FAMOTIDINE 20 MG PO TABS
ORAL_TABLET | ORAL | Status: AC
  Administered 2017-10-15: 20 mg via ORAL
  Filled 2017-10-15: qty 1

## 2017-10-15 MED ORDER — ROCURONIUM BROMIDE 50 MG/5ML IV SOLN
INTRAVENOUS | Status: AC
  Filled 2017-10-15: qty 1

## 2017-10-15 MED ORDER — HYDROMORPHONE HCL 1 MG/ML IJ SOLN
INTRAMUSCULAR | Status: DC | PRN
  Administered 2017-10-15: 1 mg via INTRAVENOUS

## 2017-10-15 MED ORDER — ONDANSETRON HCL 4 MG/2ML IJ SOLN
INTRAMUSCULAR | Status: AC
Start: 2017-10-15 — End: ?
  Filled 2017-10-15: qty 2

## 2017-10-15 MED ORDER — SODIUM CHLORIDE 0.9 % IV SOLN
INTRAVENOUS | Status: DC | PRN
  Administered 2017-10-15: 30 mL

## 2017-10-15 MED ORDER — SUGAMMADEX SODIUM 200 MG/2ML IV SOLN
INTRAVENOUS | Status: DC | PRN
  Administered 2017-10-15: 300 mg via INTRAVENOUS

## 2017-10-15 MED ORDER — BUPIVACAINE-EPINEPHRINE (PF) 0.5% -1:200000 IJ SOLN
INTRAMUSCULAR | Status: AC
  Filled 2017-10-15: qty 30

## 2017-10-15 SURGICAL SUPPLY — 37 items
BLADE SURG SZ11 CARB STEEL (BLADE) ×2 IMPLANT
CANISTER SUCT 1200ML W/VALVE (MISCELLANEOUS) ×2 IMPLANT
CHLORAPREP W/TINT 26ML (MISCELLANEOUS) ×2 IMPLANT
DERMABOND ADVANCED (GAUZE/BANDAGES/DRESSINGS) ×1
DERMABOND ADVANCED .7 DNX12 (GAUZE/BANDAGES/DRESSINGS) ×1 IMPLANT
ELECT REM PT RETURN 9FT ADLT (ELECTROSURGICAL) ×2
ELECTRODE REM PT RTRN 9FT ADLT (ELECTROSURGICAL) ×1 IMPLANT
GLOVE SURG SYN 7.0 (GLOVE) ×2 IMPLANT
GLOVE SURG SYN 7.5  E (GLOVE) ×1
GLOVE SURG SYN 7.5 E (GLOVE) ×1 IMPLANT
GOWN STRL REUS W/ TWL LRG LVL3 (GOWN DISPOSABLE) ×2 IMPLANT
GOWN STRL REUS W/TWL LRG LVL3 (GOWN DISPOSABLE) ×2
IRRIGATION STRYKERFLOW (MISCELLANEOUS) ×1 IMPLANT
IRRIGATOR STRYKERFLOW (MISCELLANEOUS) ×2
IV NS 1000ML (IV SOLUTION) ×1
IV NS 1000ML BAXH (IV SOLUTION) ×1 IMPLANT
KIT TURNOVER KIT A (KITS) ×2 IMPLANT
LABEL OR SOLS (LABEL) ×2 IMPLANT
MESH VENT LT ST 10.2X15.2CM EL (Mesh General) ×2 IMPLANT
NEEDLE HYPO 22GX1.5 SAFETY (NEEDLE) ×2 IMPLANT
NEEDLE SPNL 22GX3.5 QUINCKE BK (NEEDLE) ×2 IMPLANT
NEEDLE VERESS 14GA 120MM (NEEDLE) ×2 IMPLANT
PACK LAP CHOLECYSTECTOMY (MISCELLANEOUS) ×2 IMPLANT
PENCIL ELECTRO HAND CTR (MISCELLANEOUS) ×2 IMPLANT
SLEEVE ADV FIXATION 5X100MM (TROCAR) ×4 IMPLANT
SUT MNCRL 4-0 (SUTURE) ×1
SUT MNCRL 4-0 27XMFL (SUTURE) ×1
SUT VIC AB 3-0 SH 27 (SUTURE) ×1
SUT VIC AB 3-0 SH 27X BRD (SUTURE) ×1 IMPLANT
SUT VICRYL 0 TIES 12 18 (SUTURE) ×2 IMPLANT
SUTURE MNCRL 4-0 27XMF (SUTURE) ×1 IMPLANT
TACKER 5MM HERNIA 3.5CML NAB (ENDOMECHANICALS) ×2 IMPLANT
TRAY FOLEY W/METER SILVER 16FR (SET/KITS/TRAYS/PACK) ×2 IMPLANT
TROCAR BALLN GELPORT 12X130M (ENDOMECHANICALS) ×2 IMPLANT
TROCAR Z-THREAD FIOS 11X100 BL (TROCAR) ×2 IMPLANT
TROCAR Z-THREAD OPTICAL 5X100M (TROCAR) ×2 IMPLANT
TUBING INSUFFLATION (TUBING) ×2 IMPLANT

## 2017-10-15 NOTE — Discharge Instructions (Signed)

## 2017-10-15 NOTE — Interval H&P Note (Signed)
History and Physical Interval Note:  10/15/2017 9:36 AM  Wayne [name]  has presented today for surgery, with the diagnosis of ventral hernia  The various methods of treatment have been discussed with the patient and family. After consideration of risks, benefits and other options for treatment, the patient has consented to  Procedure(s): LAPAROSCOPIC VENTRAL HERNIA (N/A) INSERTION OF MESH (N/A) as a surgical intervention .  The patient's history has been reviewed, patient examined, no change in status, stable for surgery.  I have reviewed the patient's chart and labs.  Questions were answered to the patient's satisfaction.     An Schnabel

## 2017-10-15 NOTE — Transfer of Care (Signed)
Immediate Anesthesia Transfer of Care Note  Patient: Wayne [name]  Procedure(s) Performed: LAPAROSCOPIC VENTRAL HERNIA (N/A ) INSERTION OF MESH (N/A )  Patient Location: PACU  Anesthesia Type:General  Level of Consciousness: drowsy and patient cooperative  Airway & Oxygen Therapy: Patient Spontanous Breathing and Patient connected to face mask oxygen  Post-op Assessment: Report given to RN, Post -op Vital signs reviewed and stable and Patient moving all extremities  Post vital signs: Reviewed and stable  Last Vitals:  Vitals Value Taken Time  BP 138/79 10/15/2017  2:01 PM  Temp 36.6 C 10/15/2017  2:01 PM  Pulse 88 10/15/2017  2:04 PM  Resp 13 10/15/2017  2:04 PM  SpO2 98 % 10/15/2017  2:04 PM  Vitals shown include unvalidated device data.  Last Pain:  Vitals:   10/15/17 1401  TempSrc:   PainSc: Asleep         Complications: No apparent anesthesia complications

## 2017-10-15 NOTE — Anesthesia Post-op Follow-up Note (Signed)
Anesthesia QCDR form completed.        

## 2017-10-15 NOTE — Anesthesia Procedure Notes (Signed)
Procedure Name: Intubation Date/Time: 10/15/2017 11:14 AM Performed by: Sherol Dade, CRNA Pre-anesthesia Checklist: Patient identified, Emergency Drugs available, Suction available, Patient being monitored and Timeout performed Patient Re-evaluated:Patient Re-evaluated prior to induction Oxygen Delivery Method: Circle system utilized Preoxygenation: Pre-oxygenation with 100% oxygen Induction Type: IV induction Ventilation: Mask ventilation without difficulty Laryngoscope Size: Miller and 2 Grade View: Grade I Tube type: Oral Tube size: 7.5 mm Number of attempts: 1 Airway Equipment and Method: Stylet Placement Confirmation: positive ETCO2,  breath sounds checked- equal and bilateral,  ETT inserted through vocal cords under direct vision and CO2 detector Secured at: 22 cm Tube secured with: Tape Dental Injury: Teeth and Oropharynx as per pre-operative assessment

## 2017-10-15 NOTE — Op Note (Signed)
  Procedure Date:  10/15/2017  Pre-operative Diagnosis:  Ventral Hernia  Post-operative Diagnosis:  Ventral hernia x 2  Procedure:  Laparoscopic Ventral Hernia Repair with mesh  Surgeon:  Howie Ill, MD  Anesthesia:  General endotracheal  Estimated Blood Loss:  10 ml  Specimens:  Hernia sac  Complications:  None  Indications for Procedure:  This is a 42 y.o. male who presents with a ventral hernia.  The options of surgery versus observation were reviewed with the patient and/or family. The risks of bleeding, abscess or infection, recurrence of symptoms, potential for an open procedure, injury to surrounding structures, and chronic pain were all discussed with the patient and was willing to proceed.  Description of Procedure: The patient was correctly identified in the preoperative area and brought into the operating room.  The patient was placed supine with VTE prophylaxis in place.  Appropriate time-outs were performed.  Anesthesia was induced and the patient was intubated.  Appropriate antibiotics were infused.  The abdomen was prepped and draped in a sterile fashion. The patient's hernia defect was marked with a marking pen.  A Veress needle was introduced in the left upper quadrant and pneumoperitoneum was obtained with appropriate pressures.  Two 5-mm and one 12-mm port were placed along the left lateral side without complications.  The patient was noted to have two ventral hernias, one measuring about 3 cm, and one smaller one, superior to it, measuring about 1 cm.  These were divided by a segment of intact fascia of 1 cm length.  The superior hernia was adjacent to the falciform ligament and this was taken down with cautery.  The hernia sacs were reduced using a combination of blunt dissection and cautery with careful attention not to injure surrounding structures.  Overall, the defect from top to bottom hernia measured 5 cm length.  The hernia defects were closed primarily using  interrupted 0 Vicryl sutures using the suture passer.  Two stab incisions were created over the defects in order to pass the suture passer.  Once the defects were closed, a 6x4 inch Bard Ventralight ST Echo mesh was placed via the 12-mm port.  The positioning system was insufflated and the mesh was placed over the defect covering it entirely and tacked in place.  The mesh was further secured with 4 transfascial prolene sutures.  The 12-mm port was removed and the fascia was closed under direct visualization utilizing an Endo Close technique with 0 Vicryl interrupted sutures.  3-0 Vicryl was used for deep dermal layer.  The 5 mm ports were removed.  Local anesthetic was infused in all incisions and the incisions were closed with 4-0 Monocryl.  The wounds were cleaned and sealed with DermaBond.  The patient was emerged from anesthesia and extubated and brought to the recovery room for further management.  The patient tolerated the procedure well and all counts were correct at the end of the case.   Howie Ill, MD
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
## 2017-10-15 NOTE — Anesthesia Postprocedure Evaluation (Signed)
Anesthesia Post Note  Patient: Wayne [name]  Procedure(s) Performed: LAPAROSCOPIC VENTRAL HERNIA (N/A ) INSERTION OF MESH (N/A )  Patient location during evaluation: PACU Anesthesia Type: General Level of consciousness: awake and alert and oriented Pain management: pain level controlled Vital Signs Assessment: post-procedure vital signs reviewed and stable Respiratory status: spontaneous breathing, nonlabored ventilation and respiratory function stable Cardiovascular status: blood pressure returned to baseline and stable Postop Assessment: no signs of nausea or vomiting Anesthetic complications: no     Last Vitals:  Vitals:   10/15/17 1501 10/15/17 1510  BP: (!) 121/93 139/79  Pulse: 94 89  Resp: 17 16  Temp:  (!) 36.1 C  SpO2: 92% 97%    Last Pain:  Vitals:   10/15/17 1510  TempSrc: Temporal  PainSc: 2                  Mark Hassey

## 2017-10-16 ENCOUNTER — Telehealth: Payer: Self-pay

## 2017-10-16 ENCOUNTER — Encounter: Payer: Self-pay | Admitting: Surgery

## 2017-10-16 NOTE — Telephone Encounter (Signed)
Contacted pt to go over lab results pt is aware and doesn't have any questions or concerns 

## 2017-10-19 LAB — SURGICAL PATHOLOGY

## 2017-10-20 ENCOUNTER — Telehealth: Payer: Self-pay | Admitting: Surgery

## 2017-10-20 NOTE — Telephone Encounter (Signed)
Patient is calling said he still has a hard knot in the area where he had surgery and is just very concerned about it. Please call patient and advise.

## 2017-10-20 NOTE — Telephone Encounter (Signed)
Patient called stating he could feel a knot under the skin and is wondering if he could be having a recurrence of the hernia. He is reading a lot on [employer] regarding recurrence of hernias.  He denies lifting anything. He denies fever, chills, nausea or vomiting. His bowels are normal. No straining.  He was instructed to monitor the area of concern and to call the office if it worsened or he begins to have any symptoms. Patient has post operative appointment scheduled and verbalized understanding.

## 2017-10-29 ENCOUNTER — Encounter: Payer: No Typology Code available for payment source | Admitting: Surgery

## 2017-10-30 ENCOUNTER — Encounter: Payer: Self-pay | Admitting: Surgery

## 2017-10-30 ENCOUNTER — Ambulatory Visit (INDEPENDENT_AMBULATORY_CARE_PROVIDER_SITE_OTHER): Payer: Self-pay | Admitting: Surgery

## 2017-10-30 VITALS — BP 136/98 | HR 79 | Ht 70.0 in | Wt 271.0 lb

## 2017-10-30 DIAGNOSIS — K439 Ventral hernia without obstruction or gangrene: Secondary | ICD-10-CM

## 2017-10-30 NOTE — Progress Notes (Signed)
S/p lap ventral H by Dr. Aleen Campi 5/9 Doing well Small knot No pain, + PO  PE NAD Abd: soft, incision c/d/i.Small seroma near umbilicus. No infection or recurrence  A/P Doing well D/w pt in detail about seroma, watchful waiting RTC prn

## 2017-10-30 NOTE — Patient Instructions (Signed)
Patient to return as needed. The patient is aware to call back for any questions or concerns. 

## 2017-11-16 MED FILL — AMLODIPINE BESYLATE 10 MG T: 10 | 30 days supply | Qty: 30 | Fill #7

## 2017-11-16 MED FILL — POLYETHYLENE GLYCOL 3350 PO: 15 days supply | Qty: 510 | Fill #1

## 2017-11-19 ENCOUNTER — Telehealth: Payer: Self-pay | Admitting: Internal Medicine

## 2017-11-19 ENCOUNTER — Other Ambulatory Visit: Payer: Self-pay | Admitting: Internal Medicine

## 2017-11-19 DIAGNOSIS — Z8739 Personal history of other diseases of the musculoskeletal system and connective tissue: Secondary | ICD-10-CM

## 2017-11-19 MED FILL — ALLOPURINOL 300 MG TAB: 300 | 30 days supply | Qty: 30 | Fill #4

## 2017-11-19 MED FILL — $COLCRYS 0.6 MG TABLET: 0.6 | 30 days supply | Qty: 30 | Fill #0

## 2017-11-19 NOTE — Telephone Encounter (Signed)
Patient came in clinic stating that he needs his medication refilled due to him being completely out  allopurinol (ZYLOPRIM) 300 MG tablet   COLCRYS 0.6 MG tablet   [facility] pharmacy  [facility] [place]  Please Follow up [phone]

## 2017-11-19 NOTE — Telephone Encounter (Signed)
Called patient to inform medication refill for COLCRYS 0.6 MG tablet  Had been approved and sent to pharmacy. Patient states that he also need  allopurinol (ZYLOPRIM) 300 MG tablet  To be refilled.   Please Follow Up [phone]  Uses pharmacy   [facility] [place]

## 2017-11-20 MED ORDER — ALLOPURINOL 300 MG PO TABS: 300.0000 mg | ORAL_TABLET | Freq: Every day | ORAL | 1 refills | Status: DC

## 2017-11-30 ENCOUNTER — Telehealth: Payer: Self-pay | Admitting: Surgery

## 2017-11-30 NOTE — Telephone Encounter (Signed)
Called patient back and had to leave him a detailed message letting him know that at this time he does not have a lifting restrictions. However, I told him that he should take it easy at first and to listen to his body. He starts to have discomfort, that means to stop and try again in 2 days and so forth until he is back to his normal. I told patient to call us back in case he had further questions.

## 2017-11-30 NOTE — Telephone Encounter (Signed)
Patient is calling said he had surgery back on 10/15/17 he had a laparoscopic ventral hernia. Patient is asking if it's okay to start lifting weights again. Please call patient and advise.

## 2017-12-07 ENCOUNTER — Encounter (HOSPITAL_COMMUNITY): Payer: Self-pay | Admitting: Emergency Medicine

## 2017-12-07 ENCOUNTER — Emergency Department (HOSPITAL_COMMUNITY)
Admission: EM | Admit: 2017-12-07 | Discharge: 2017-12-07 | Disposition: A | Discharge status: Home / Self Care | Payer: No Typology Code available for payment source | Attending: Emergency Medicine | Admitting: Emergency Medicine

## 2017-12-07 ENCOUNTER — Emergency Department (HOSPITAL_COMMUNITY): Payer: No Typology Code available for payment source

## 2017-12-07 DIAGNOSIS — X500XXA Overexertion from strenuous movement or load, initial encounter: Secondary | ICD-10-CM | POA: Insufficient documentation

## 2017-12-07 DIAGNOSIS — Y929 Unspecified place or not applicable: Secondary | ICD-10-CM | POA: Insufficient documentation

## 2017-12-07 DIAGNOSIS — S82832A Other fracture of upper and lower end of left fibula, initial encounter for closed fracture: Secondary | ICD-10-CM | POA: Insufficient documentation

## 2017-12-07 DIAGNOSIS — Y999 Unspecified external cause status: Secondary | ICD-10-CM | POA: Insufficient documentation

## 2017-12-07 DIAGNOSIS — I10 Essential (primary) hypertension: Secondary | ICD-10-CM | POA: Insufficient documentation

## 2017-12-07 DIAGNOSIS — Y9389 Activity, other specified: Secondary | ICD-10-CM | POA: Insufficient documentation

## 2017-12-07 DIAGNOSIS — Z79899 Other long term (current) drug therapy: Secondary | ICD-10-CM | POA: Insufficient documentation

## 2017-12-07 MED ORDER — OXYCODONE HCL 5 MG PO TABS: 5.0000 mg | ORAL_TABLET | Freq: Four times a day (QID) | ORAL | 0 refills | Status: AC

## 2017-12-07 MED ORDER — OXYCODONE HCL 5 MG PO TABS
10.0000 mg | ORAL_TABLET | Freq: Once | ORAL | Status: AC
Start: 2017-12-07 — End: 2017-12-07
  Administered 2017-12-07: 10 mg via ORAL
  Filled 2017-12-07: qty 2
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
## 2017-12-07 NOTE — ED Triage Notes (Signed)
Per pt, states he was playing with the kids when  his foot/ankle/leg gave out-states he heard something pop

## 2017-12-07 NOTE — Discharge Instructions (Addendum)
X-Ray Report: "Acute mildly displaced distal fibular fracture. Findings suggestive of an avulsion fracture of the medial malleolus or medial process of the talus with associated widening of the medial clear space."  Call orthopedics doctor ASAP, Dr. Roda ShuttersXu, to schedule an appointment.  Do NOT put any weight on your left foot until you get seen by the orthopedic doctor. Use crutches to walk.

## 2017-12-08 ENCOUNTER — Ambulatory Visit (INDEPENDENT_AMBULATORY_CARE_PROVIDER_SITE_OTHER): Payer: Self-pay | Admitting: Orthopaedic Surgery

## 2017-12-08 ENCOUNTER — Encounter (INDEPENDENT_AMBULATORY_CARE_PROVIDER_SITE_OTHER): Payer: Self-pay | Admitting: Orthopaedic Surgery

## 2017-12-08 DIAGNOSIS — S8262XA Displaced fracture of lateral malleolus of left fibula, initial encounter for closed fracture: Secondary | ICD-10-CM | POA: Insufficient documentation

## 2017-12-08 DIAGNOSIS — S93432A Sprain of tibiofibular ligament of left ankle, initial encounter: Secondary | ICD-10-CM

## 2017-12-08 HISTORY — DX: Sprain of tibiofibular ligament of left ankle, initial encounter: S93.432A

## 2017-12-08 HISTORY — DX: Displaced fracture of lateral malleolus of left fibula, initial encounter for closed fracture: S82.62XA

## 2017-12-08 MED ORDER — ACETAMINOPHEN 500 MG PO TABS: 1000.0000 mg | ORAL_TABLET | Freq: Four times a day (QID) | ORAL | 0 refills | Status: DC | PRN

## 2017-12-08 MED ORDER — OXYCODONE HCL 5 MG PO TABS: 5.0000 mg | ORAL_TABLET | Freq: Three times a day (TID) | ORAL | 0 refills | Status: DC | PRN
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
## 2017-12-09 ENCOUNTER — Telehealth (INDEPENDENT_AMBULATORY_CARE_PROVIDER_SITE_OTHER): Payer: Self-pay | Admitting: Orthopaedic Surgery

## 2017-12-09 ENCOUNTER — Encounter (HOSPITAL_COMMUNITY): Payer: Self-pay | Admitting: *Deleted

## 2017-12-09 ENCOUNTER — Emergency Department (HOSPITAL_COMMUNITY)
Admission: EM | Admit: 2017-12-09 | Discharge: 2017-12-09 | Disposition: A | Discharge status: Home / Self Care | Payer: Self-pay | Attending: Emergency Medicine | Admitting: Emergency Medicine

## 2017-12-09 ENCOUNTER — Other Ambulatory Visit: Payer: Self-pay

## 2017-12-09 ENCOUNTER — Telehealth (INDEPENDENT_AMBULATORY_CARE_PROVIDER_SITE_OTHER): Payer: Self-pay

## 2017-12-09 ENCOUNTER — Emergency Department (HOSPITAL_COMMUNITY): Payer: Self-pay

## 2017-12-09 ENCOUNTER — Encounter (HOSPITAL_COMMUNITY): Payer: Self-pay

## 2017-12-09 DIAGNOSIS — Y999 Unspecified external cause status: Secondary | ICD-10-CM | POA: Insufficient documentation

## 2017-12-09 DIAGNOSIS — S838X2A Sprain of other specified parts of left knee, initial encounter: Secondary | ICD-10-CM | POA: Insufficient documentation

## 2017-12-09 DIAGNOSIS — Y9389 Activity, other specified: Secondary | ICD-10-CM | POA: Insufficient documentation

## 2017-12-09 DIAGNOSIS — W1831XA Fall on same level due to stepping on an object, initial encounter: Secondary | ICD-10-CM | POA: Insufficient documentation

## 2017-12-09 DIAGNOSIS — I10 Essential (primary) hypertension: Secondary | ICD-10-CM | POA: Insufficient documentation

## 2017-12-09 DIAGNOSIS — Y929 Unspecified place or not applicable: Secondary | ICD-10-CM | POA: Insufficient documentation

## 2017-12-09 DIAGNOSIS — M25462 Effusion, left knee: Secondary | ICD-10-CM | POA: Insufficient documentation

## 2017-12-09 DIAGNOSIS — Z79899 Other long term (current) drug therapy: Secondary | ICD-10-CM | POA: Insufficient documentation

## 2017-12-09 MED ORDER — IBUPROFEN 800 MG PO TABS
800.0000 mg | ORAL_TABLET | Freq: Once | ORAL | Status: AC
  Administered 2017-12-09: 800 mg via ORAL
  Filled 2017-12-09: qty 1

## 2017-12-09 MED ORDER — OXYCODONE-ACETAMINOPHEN 5-325 MG PO TABS
1.0000 | ORAL_TABLET | Freq: Once | ORAL | Status: AC
  Administered 2017-12-09: 1 via ORAL
  Filled 2017-12-09: qty 1

## 2017-12-09 NOTE — Progress Notes (Signed)
Spoke with pt for pre-op call. Pt denies cardiac history, chest pain or sob. Pt states he is having left knee pain today, thinks it might be his gout flaring up. States he's in the ED now and was concerned if they gave him Prednisone that his surgery would be cancelled. I told him from our perspective, Prednisone is ok to take prior to surgery. I did instruct him to call Dr. Warren DanesXu's office to let him know about the knee pain. He states he's left a message at the office for someone to call him back.

## 2017-12-09 NOTE — Telephone Encounter (Signed)
Patient called and states he  is having a New pain in the knee. Didn't have this before and is as painful as the ankle. He is having SU Monday. Would like to know what he needs to do or does he need to come in to get Xray of knee? Taking Oxycodone for pain which  helps temp.     CB: [phone]

## 2017-12-09 NOTE — ED Notes (Signed)
Patient to MRI.
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
## 2017-12-09 NOTE — ED Notes (Signed)
Ace wrap applied. Patient verbalized understanding of discharge instructions, no questions. Patient in wheelchair out of ED in no distress.

## 2017-12-09 NOTE — Discharge Instructions (Addendum)
Weight bearing left leg. Ace wrap for compression as needed. Elevate leg. Apply ice to ankle and knee for 20 minutes at a time. Follow up with your orthopedic doctor.

## 2017-12-09 NOTE — ED Triage Notes (Signed)
Patient reports that he had a fall 2 days ago and had a fractured ankle and is due for surgery on 12/14/17. Patient is c/o left knee pain with slight swelling. Patient reports that he did not receive an x-ray of his left knee at the time of his fall.

## 2017-12-11 NOTE — Progress Notes (Signed)
Pt called today stating that he couldn't remember the pre-op instructions I gave him on Wednesday. I gave him the instructions along with which medications to stop as of today (Multivitamins, Fish Oil and NSAIDS)

## 2017-12-13 MED ORDER — DEXTROSE 5 % IV SOLN
3.0000 g | INTRAVENOUS | Status: AC
  Filled 2017-12-13: qty 3000

## 2017-12-14 ENCOUNTER — Other Ambulatory Visit (INDEPENDENT_AMBULATORY_CARE_PROVIDER_SITE_OTHER): Payer: Self-pay | Admitting: Orthopaedic Surgery

## 2017-12-14 MED ORDER — HYDROCODONE-ACETAMINOPHEN 5-325 MG PO TABS: 1.0000 | ORAL_TABLET | Freq: Every day | ORAL | 0 refills | Status: DC | PRN

## 2017-12-15 ENCOUNTER — Encounter: Payer: Self-pay | Admitting: Internal Medicine

## 2017-12-15 ENCOUNTER — Other Ambulatory Visit: Payer: Self-pay

## 2017-12-15 ENCOUNTER — Encounter (HOSPITAL_BASED_OUTPATIENT_CLINIC_OR_DEPARTMENT_OTHER): Payer: Self-pay | Admitting: *Deleted

## 2017-12-15 MED FILL — $COLCRYS 0.6 MG TABLET: 0.6 | 30 days supply | Qty: 30 | Fill #1

## 2017-12-15 MED FILL — ALLOPURINOL 300 MG TAB: 300 | 30 days supply | Qty: 30 | Fill #5

## 2017-12-15 MED FILL — AMLODIPINE BESYLATE 10 MG T: 10 | 30 days supply | Qty: 30 | Fill #8

## 2017-12-15 MED FILL — POLYETHYLENE GLYCOL 3350 PO: 15 days supply | Qty: 510 | Fill #2

## 2017-12-16 ENCOUNTER — Ambulatory Visit: Payer: No Typology Code available for payment source

## 2017-12-16 ENCOUNTER — Encounter (HOSPITAL_BASED_OUTPATIENT_CLINIC_OR_DEPARTMENT_OTHER): Payer: Self-pay

## 2017-12-16 ENCOUNTER — Ambulatory Visit (HOSPITAL_BASED_OUTPATIENT_CLINIC_OR_DEPARTMENT_OTHER)
Admission: RE | Admit: 2017-12-16 | Discharge: 2017-12-16 | Disposition: A | Discharge status: Home / Self Care | Payer: Self-pay | Source: Ambulatory Visit | Attending: Orthopaedic Surgery | Admitting: Orthopaedic Surgery

## 2017-12-16 ENCOUNTER — Ambulatory Visit (HOSPITAL_BASED_OUTPATIENT_CLINIC_OR_DEPARTMENT_OTHER): Payer: Self-pay | Admitting: Anesthesiology

## 2017-12-16 ENCOUNTER — Encounter (HOSPITAL_BASED_OUTPATIENT_CLINIC_OR_DEPARTMENT_OTHER)
Admission: RE | Disposition: A | Discharge status: Home / Self Care | Payer: Self-pay | Source: Ambulatory Visit | Attending: Orthopaedic Surgery

## 2017-12-16 ENCOUNTER — Ambulatory Visit (HOSPITAL_COMMUNITY): Payer: Self-pay

## 2017-12-16 ENCOUNTER — Other Ambulatory Visit: Payer: Self-pay

## 2017-12-16 DIAGNOSIS — S82432A Displaced oblique fracture of shaft of left fibula, initial encounter for closed fracture: Secondary | ICD-10-CM | POA: Insufficient documentation

## 2017-12-16 DIAGNOSIS — Z419 Encounter for procedure for purposes other than remedying health state, unspecified: Secondary | ICD-10-CM

## 2017-12-16 DIAGNOSIS — M25462 Effusion, left knee: Secondary | ICD-10-CM

## 2017-12-16 DIAGNOSIS — M109 Gout, unspecified: Secondary | ICD-10-CM | POA: Insufficient documentation

## 2017-12-16 DIAGNOSIS — Z6838 Body mass index (BMI) 38.0-38.9, adult: Secondary | ICD-10-CM | POA: Insufficient documentation

## 2017-12-16 DIAGNOSIS — Z79899 Other long term (current) drug therapy: Secondary | ICD-10-CM | POA: Insufficient documentation

## 2017-12-16 DIAGNOSIS — S8262XA Displaced fracture of lateral malleolus of left fibula, initial encounter for closed fracture: Secondary | ICD-10-CM | POA: Diagnosis present

## 2017-12-16 DIAGNOSIS — I10 Essential (primary) hypertension: Secondary | ICD-10-CM | POA: Insufficient documentation

## 2017-12-16 DIAGNOSIS — Z881 Allergy status to other antibiotic agents status: Secondary | ICD-10-CM | POA: Insufficient documentation

## 2017-12-16 DIAGNOSIS — Z880 Allergy status to penicillin: Secondary | ICD-10-CM | POA: Insufficient documentation

## 2017-12-16 DIAGNOSIS — E669 Obesity, unspecified: Secondary | ICD-10-CM | POA: Insufficient documentation

## 2017-12-16 DIAGNOSIS — S93432A Sprain of tibiofibular ligament of left ankle, initial encounter: Secondary | ICD-10-CM | POA: Diagnosis present

## 2017-12-16 DIAGNOSIS — M1990 Unspecified osteoarthritis, unspecified site: Secondary | ICD-10-CM | POA: Insufficient documentation

## 2017-12-16 DIAGNOSIS — Z882 Allergy status to sulfonamides status: Secondary | ICD-10-CM | POA: Insufficient documentation

## 2017-12-16 DIAGNOSIS — X58XXXA Exposure to other specified factors, initial encounter: Secondary | ICD-10-CM | POA: Insufficient documentation

## 2017-12-16 HISTORY — PX: ORIF ANKLE FRACTURE: SHX5408

## 2017-12-16 HISTORY — DX: Effusion, left knee: M25.462

## 2017-12-16 HISTORY — DX: Other fracture of upper and lower end of left fibula, initial encounter for closed fracture: S82.832A

## 2017-12-16 HISTORY — PX: INJECTION KNEE: SHX2446

## 2017-12-16 SURGERY — OPEN REDUCTION INTERNAL FIXATION (ORIF) ANKLE FRACTURE
Anesthesia: General | Site: Knee | Laterality: Left

## 2017-12-16 MED ORDER — LACTATED RINGERS IV SOLN: INTRAVENOUS | Status: DC

## 2017-12-16 MED ORDER — PHENYLEPHRINE 40 MCG/ML (10ML) SYRINGE FOR IV PUSH (FOR BLOOD PRESSURE SUPPORT)
PREFILLED_SYRINGE | INTRAVENOUS | Status: AC
  Filled 2017-12-16: qty 10

## 2017-12-16 MED ORDER — SENNOSIDES-DOCUSATE SODIUM 8.6-50 MG PO TABS: 1.0000 | ORAL_TABLET | Freq: Every evening | ORAL | 1 refills | Status: DC | PRN

## 2017-12-16 MED ORDER — DEXTROSE 5 % IV SOLN
INTRAVENOUS | Status: DC | PRN
  Administered 2017-12-16: 3 g via INTRAVENOUS

## 2017-12-16 MED ORDER — PROMETHAZINE HCL 25 MG PO TABS: 25.0000 mg | ORAL_TABLET | Freq: Four times a day (QID) | ORAL | 1 refills | Status: DC | PRN

## 2017-12-16 MED ORDER — ONDANSETRON HCL 4 MG PO TABS: 4.0000 mg | ORAL_TABLET | Freq: Three times a day (TID) | ORAL | 0 refills | Status: DC | PRN

## 2017-12-16 MED ORDER — CEFAZOLIN SODIUM-DEXTROSE 1-4 GM/50ML-% IV SOLN
INTRAVENOUS | Status: AC
  Filled 2017-12-16: qty 50

## 2017-12-16 MED ORDER — BUPIVACAINE HCL (PF) 0.25 % IJ SOLN
INTRAMUSCULAR | Status: AC
  Filled 2017-12-16: qty 30

## 2017-12-16 MED ORDER — ONDANSETRON HCL 4 MG/2ML IJ SOLN: INTRAMUSCULAR | Status: DC | PRN

## 2017-12-16 MED ORDER — PROPOFOL 10 MG/ML IV BOLUS
INTRAVENOUS | Status: AC
  Filled 2017-12-16: qty 20

## 2017-12-16 MED ORDER — ZINC SULFATE 220 (50 ZN) MG PO CAPS: 220.0000 mg | ORAL_CAPSULE | Freq: Every day | ORAL | 0 refills | Status: DC

## 2017-12-16 MED ORDER — MIDAZOLAM HCL 2 MG/2ML IJ SOLN
INTRAMUSCULAR | Status: AC
  Filled 2017-12-16: qty 2

## 2017-12-16 MED ORDER — DEXAMETHASONE SODIUM PHOSPHATE 10 MG/ML IJ SOLN
INTRAMUSCULAR | Status: AC
  Filled 2017-12-16: qty 1

## 2017-12-16 MED ORDER — BUPIVACAINE-EPINEPHRINE (PF) 0.5% -1:200000 IJ SOLN
INTRAMUSCULAR | Status: DC | PRN
  Administered 2017-12-16: 40 mL via PERINEURAL

## 2017-12-16 MED ORDER — LIDOCAINE HCL (CARDIAC) PF 100 MG/5ML IV SOSY
PREFILLED_SYRINGE | INTRAVENOUS | Status: AC
  Filled 2017-12-16: qty 5

## 2017-12-16 MED ORDER — PROMETHAZINE HCL 25 MG/ML IJ SOLN: 6.2500 mg | INTRAMUSCULAR | Status: DC | PRN

## 2017-12-16 MED ORDER — FENTANYL CITRATE (PF) 100 MCG/2ML IJ SOLN: 25.0000 ug | INTRAMUSCULAR | Status: DC | PRN

## 2017-12-16 MED ORDER — FENTANYL CITRATE (PF) 100 MCG/2ML IJ SOLN
50.0000 ug | INTRAMUSCULAR | Status: DC | PRN
  Administered 2017-12-16: 50 ug via INTRAVENOUS
  Administered 2017-12-16: 100 ug via INTRAVENOUS

## 2017-12-16 MED ORDER — ONDANSETRON HCL 4 MG/2ML IJ SOLN
INTRAMUSCULAR | Status: AC
  Filled 2017-12-16: qty 2

## 2017-12-16 MED ORDER — LIDOCAINE HCL (PF) 1 % IJ SOLN
INTRAMUSCULAR | Status: DC | PRN
  Administered 2017-12-16: 4.5 mL

## 2017-12-16 MED ORDER — LACTATED RINGERS IV SOLN
INTRAVENOUS | Status: DC
  Administered 2017-12-16: 11:00:00 via INTRAVENOUS

## 2017-12-16 MED ORDER — LIDOCAINE HCL (PF) 1 % IJ SOLN
INTRAMUSCULAR | Status: AC
  Filled 2017-12-16: qty 30

## 2017-12-16 MED ORDER — CHLORHEXIDINE GLUCONATE 4 % EX LIQD: 60.0000 mL | Freq: Once | CUTANEOUS | Status: DC

## 2017-12-16 MED ORDER — PHENYLEPHRINE 40 MCG/ML (10ML) SYRINGE FOR IV PUSH (FOR BLOOD PRESSURE SUPPORT)
PREFILLED_SYRINGE | INTRAVENOUS | Status: DC | PRN
  Administered 2017-12-16 (×2): 80 ug via INTRAVENOUS

## 2017-12-16 MED ORDER — LIDOCAINE HCL (CARDIAC) PF 100 MG/5ML IV SOSY
PREFILLED_SYRINGE | INTRAVENOUS | Status: DC | PRN
  Administered 2017-12-16: 100 mg via INTRAVENOUS

## 2017-12-16 MED ORDER — METHYLPREDNISOLONE ACETATE 80 MG/ML IJ SUSP
INTRAMUSCULAR | Status: DC | PRN
  Administered 2017-12-16: 80 mg via INTRA_ARTICULAR

## 2017-12-16 MED ORDER — FENTANYL CITRATE (PF) 100 MCG/2ML IJ SOLN
INTRAMUSCULAR | Status: AC
  Filled 2017-12-16: qty 2

## 2017-12-16 MED ORDER — ASPIRIN EC 81 MG PO TBEC: 81.0000 mg | DELAYED_RELEASE_TABLET | Freq: Two times a day (BID) | ORAL | 0 refills | Status: DC

## 2017-12-16 MED ORDER — PROPOFOL 10 MG/ML IV BOLUS
INTRAVENOUS | Status: DC | PRN
  Administered 2017-12-16: 250 mg via INTRAVENOUS

## 2017-12-16 MED ORDER — SCOPOLAMINE 1 MG/3DAYS TD PT72: 1.0000 | MEDICATED_PATCH | Freq: Once | TRANSDERMAL | Status: DC | PRN

## 2017-12-16 MED ORDER — CEFAZOLIN SODIUM-DEXTROSE 2-4 GM/100ML-% IV SOLN
INTRAVENOUS | Status: AC
  Filled 2017-12-16: qty 100

## 2017-12-16 MED ORDER — MIDAZOLAM HCL 2 MG/2ML IJ SOLN
1.0000 mg | INTRAMUSCULAR | Status: DC | PRN
  Administered 2017-12-16: 2 mg via INTRAVENOUS

## 2017-12-16 MED ORDER — BUPIVACAINE HCL (PF) 0.25 % IJ SOLN
INTRAMUSCULAR | Status: DC | PRN
  Administered 2017-12-16: 4.5 mL

## 2017-12-16 MED ORDER — OXYCODONE HCL ER 10 MG PO T12A: 10.0000 mg | EXTENDED_RELEASE_TABLET | Freq: Two times a day (BID) | ORAL | 0 refills | Status: AC

## 2017-12-16 MED ORDER — HYDROCODONE-ACETAMINOPHEN 7.5-325 MG PO TABS: 1.0000 | ORAL_TABLET | Freq: Four times a day (QID) | ORAL | 0 refills | Status: DC | PRN

## 2017-12-16 MED ORDER — CALCIUM CARBONATE-VITAMIN D 500-200 MG-UNIT PO TABS: 1.0000 | ORAL_TABLET | Freq: Three times a day (TID) | ORAL | 12 refills | Status: DC

## 2017-12-16 MED ORDER — DEXAMETHASONE SODIUM PHOSPHATE 4 MG/ML IJ SOLN
INTRAMUSCULAR | Status: DC | PRN
  Administered 2017-12-16: 10 mg via INTRAVENOUS

## 2017-12-16 MED FILL — PROMETHAZINE 25 MG TABLET: 25 | 7 days supply | Qty: 30 | Fill #0

## 2017-12-16 MED FILL — ONDANSETRON HCL 4 MG TABLET: 4 | 6 days supply | Qty: 40 | Fill #0

## 2017-12-16 SURGICAL SUPPLY — 88 items
BANDAGE ACE 4X5 VEL STRL LF (GAUZE/BANDAGES/DRESSINGS) ×4 IMPLANT
BANDAGE ACE 6X5 VEL STRL LF (GAUZE/BANDAGES/DRESSINGS) ×4 IMPLANT
BANDAGE ESMARK 6X9 LF (GAUZE/BANDAGES/DRESSINGS) ×2 IMPLANT
BIT DRILL QC 2.5MM SHRT EVO SM (DRILL) ×4 IMPLANT
BLADE HEX COATED 2.75 (ELECTRODE) ×4 IMPLANT
BLADE SURG 15 STRL LF DISP TIS (BLADE) ×6 IMPLANT
BLADE SURG 15 STRL SS (BLADE) ×6
BNDG COHESIVE 6X5 TAN STRL LF (GAUZE/BANDAGES/DRESSINGS) ×4 IMPLANT
BNDG ESMARK 6X9 LF (GAUZE/BANDAGES/DRESSINGS) ×4
BRUSH SCRUB EZ PLAIN DRY (MISCELLANEOUS) ×4 IMPLANT
CANISTER SUCT 1200ML W/VALVE (MISCELLANEOUS) ×4 IMPLANT
COVER BACK TABLE 60X90IN (DRAPES) ×4 IMPLANT
COVER MAYO STAND STRL (DRAPES) ×4 IMPLANT
CUFF TOURNIQUET SINGLE 34IN LL (TOURNIQUET CUFF) ×4 IMPLANT
DECANTER SPIKE VIAL GLASS SM (MISCELLANEOUS) IMPLANT
DRAPE C-ARM 42X72 X-RAY (DRAPES) ×4 IMPLANT
DRAPE C-ARMOR (DRAPES) ×4 IMPLANT
DRAPE EXTREMITY T 121X128X90 (DRAPE) ×4 IMPLANT
DRAPE IMP U-DRAPE 54X76 (DRAPES) ×4 IMPLANT
DRAPE SURG 17X23 STRL (DRAPES) IMPLANT
DRAPE U-SHAPE 47X51 STRL (DRAPES) ×4 IMPLANT
DRILL QC 2.5MM SHORT EVOS SM (DRILL) ×8
DRSG PAD ABDOMINAL 8X10 ST (GAUZE/BANDAGES/DRESSINGS) ×8 IMPLANT
DURAPREP 26ML APPLICATOR (WOUND CARE) ×8 IMPLANT
ELECT REM PT RETURN 9FT ADLT (ELECTROSURGICAL) ×4
ELECTRODE REM PT RTRN 9FT ADLT (ELECTROSURGICAL) ×2 IMPLANT
GAUZE SPONGE 4X4 12PLY STRL (GAUZE/BANDAGES/DRESSINGS) ×4 IMPLANT
GAUZE XEROFORM 1X8 LF (GAUZE/BANDAGES/DRESSINGS) ×4 IMPLANT
GLOVE BIO SURGEON STRL SZ 6.5 (GLOVE) ×3 IMPLANT
GLOVE BIO SURGEONS STRL SZ 6.5 (GLOVE) ×1
GLOVE BIOGEL PI IND STRL 7.0 (GLOVE) ×4 IMPLANT
GLOVE BIOGEL PI INDICATOR 7.0 (GLOVE) ×4
GLOVE ECLIPSE 7.0 STRL STRAW (GLOVE) ×4 IMPLANT
GLOVE SKINSENSE NS SZ7.5 (GLOVE) ×2
GLOVE SKINSENSE STRL SZ7.5 (GLOVE) ×2 IMPLANT
GLOVE SURG SYN 7.5  E (GLOVE) ×2
GLOVE SURG SYN 7.5 E (GLOVE) ×2 IMPLANT
GOWN STRL REIN XL XLG (GOWN DISPOSABLE) ×4 IMPLANT
GOWN STRL REUS W/ TWL LRG LVL3 (GOWN DISPOSABLE) ×2 IMPLANT
GOWN STRL REUS W/ TWL XL LVL3 (GOWN DISPOSABLE) ×2 IMPLANT
GOWN STRL REUS W/TWL LRG LVL3 (GOWN DISPOSABLE) ×3
GOWN STRL REUS W/TWL XL LVL3 (GOWN DISPOSABLE) ×3
KIT INVISIKNOT ANKLE FRACTURE (Screw) ×4 IMPLANT
MANIFOLD NEPTUNE II (INSTRUMENTS) ×4 IMPLANT
NEEDLE HYPO 22GX1.5 SAFETY (NEEDLE) IMPLANT
NS IRRIG 1000ML POUR BTL (IV SOLUTION) ×4 IMPLANT
PACK BASIN DAY SURGERY FS (CUSTOM PROCEDURE TRAY) ×4 IMPLANT
PAD CAST 3X4 CTTN HI CHSV (CAST SUPPLIES) IMPLANT
PAD CAST 4YDX4 CTTN HI CHSV (CAST SUPPLIES) ×2 IMPLANT
PADDING CAST ABS 4INX4YD NS (CAST SUPPLIES) ×8
PADDING CAST ABS COTTON 4X4 ST (CAST SUPPLIES) ×8 IMPLANT
PADDING CAST COTTON 3X4 STRL (CAST SUPPLIES)
PADDING CAST COTTON 4X4 STRL (CAST SUPPLIES) ×3
PADDING CAST COTTON 6X4 STRL (CAST SUPPLIES) ×4 IMPLANT
PADDING CAST SYN 6 (CAST SUPPLIES) ×2
PADDING CAST SYNTHETIC 4 (CAST SUPPLIES) ×2
PADDING CAST SYNTHETIC 4X4 STR (CAST SUPPLIES) ×2 IMPLANT
PADDING CAST SYNTHETIC 6X4 NS (CAST SUPPLIES) ×2 IMPLANT
PENCIL BUTTON HOLSTER BLD 10FT (ELECTRODE) ×4 IMPLANT
PLATE LOCK EVOS 3.5X94 8H (Plate) ×4 IMPLANT
SCREW CANC 4.7X16MM FT (Screw) ×4 IMPLANT
SCREW CORT 3.5X14 ST EVOS (Screw) ×4 IMPLANT
SCREW CORT 3.5X15 ST EVOS (Screw) ×8 IMPLANT
SCREW CORT 3.5X16 ST EVOS (Screw) ×4 IMPLANT
SCREW CORT 3.5X17 ST EVOS (Screw) ×4 IMPLANT
SCREW CORT EVOS ST 3.5X12 (Screw) ×8 IMPLANT
SHEET MEDIUM DRAPE 40X70 STRL (DRAPES) ×4 IMPLANT
SLEEVE SCD COMPRESS KNEE MED (MISCELLANEOUS) ×4 IMPLANT
SPLINT FIBERGLASS 4X30 (CAST SUPPLIES) ×4 IMPLANT
SPONGE LAP 18X18 RF (DISPOSABLE) ×4 IMPLANT
SUCTION FRAZIER HANDLE 10FR (MISCELLANEOUS) ×2
SUCTION TUBE FRAZIER 10FR DISP (MISCELLANEOUS) ×2 IMPLANT
SUT ETHILON 3 0 PS 1 (SUTURE) IMPLANT
SUT VIC AB 0 CT1 27 (SUTURE)
SUT VIC AB 0 CT1 27XBRD ANBCTR (SUTURE) IMPLANT
SUT VIC AB 2-0 CT1 27 (SUTURE) ×2
SUT VIC AB 2-0 CT1 TAPERPNT 27 (SUTURE) ×2 IMPLANT
SUT VIC AB 3-0 SH 27 (SUTURE)
SUT VIC AB 3-0 SH 27X BRD (SUTURE) IMPLANT
SYR BULB 3OZ (MISCELLANEOUS) ×4 IMPLANT
SYR CONTROL 10ML LL (SYRINGE) IMPLANT
TOWEL GREEN STERILE FF (TOWEL DISPOSABLE) ×4 IMPLANT
TOWEL OR NON WOVEN STRL DISP B (DISPOSABLE) ×4 IMPLANT
TRAY DSU PREP LF (CUSTOM PROCEDURE TRAY) ×4 IMPLANT
TUBE CONNECTING 20'X1/4 (TUBING) ×1
TUBE CONNECTING 20X1/4 (TUBING) ×3 IMPLANT
UNDERPAD 30X30 (UNDERPADS AND DIAPERS) ×4 IMPLANT
YANKAUER SUCT BULB TIP NO VENT (SUCTIONS) ×4 IMPLANT
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
## 2017-12-16 NOTE — Progress Notes (Signed)
Assisted Dr. Turk with left, ultrasound guided, popliteal/saphenous block. Side rails up, monitors on throughout procedure. See vital signs in flow sheet. Tolerated Procedure well. 

## 2017-12-16 NOTE — Discharge Instructions (Signed)
    1. Keep splint clean and dry 2. Elevate foot above level of the heart 3. Take aspirin to prevent blood clots 4. Take pain meds as needed 5. Strict non weight bearing to operative extremity   Post Anesthesia Home Care Instructions  Activity: Get plenty of rest for the remainder of the day. A responsible individual must stay with you for 24 hours following the procedure.  For the next 24 hours, DO NOT: -Drive a car -Operate machinery -Drink alcoholic beverages -Take any medication unless instructed by your physician -Make any legal decisions or sign important papers.  Meals: Start with liquid foods such as gelatin or soup. Progress to regular foods as tolerated. Avoid greasy, spicy, heavy foods. If nausea and/or vomiting occur, drink only clear liquids until the nausea and/or vomiting subsides. Call your physician if vomiting continues.  Special Instructions/Symptoms: Your throat may feel dry or sore from the anesthesia or the breathing tube placed in your throat during surgery. If this causes discomfort, gargle with warm salt water. The discomfort should disappear within 24 hours.  If you had a scopolamine patch placed behind your ear for the management of post- operative nausea and/or vomiting:  1. The medication in the patch is effective for 72 hours, after which it should be removed.  Wrap patch in a tissue and discard in the trash. Wash hands thoroughly with soap and water. 2. You may remove the patch earlier than 72 hours if you experience unpleasant side effects which may include dry mouth, dizziness or visual disturbances. 3. Avoid touching the patch. Wash your hands with soap and water after contact with the patch.  Regional Anesthesia Blocks  1. Numbness or the inability to move the "blocked" extremity may last from 3-48 hours after placement. The length of time depends on the medication injected and your individual response to the medication. If the numbness is not  going away after 48 hours, call your surgeon.  2. The extremity that is blocked will need to be protected until the numbness is gone and the  Strength has returned. Because you cannot feel it, you will need to take extra care to avoid injury. Because it may be weak, you may have difficulty moving it or using it. You may not know what position it is in without looking at it while the block is in effect.  3. For blocks in the legs and feet, returning to weight bearing and walking needs to be done carefully. You will need to wait until the numbness is entirely gone and the strength has returned. You should be able to move your leg and foot normally before you try and bear weight or walk. You will need someone to be with you when you first try to ensure you do not fall and possibly risk injury.  4. Bruising and tenderness at the needle site are common side effects and will resolve in a few days.  5. Persistent numbness or new problems with movement should be communicated to the surgeon or the [facility] ([phone])/ [facility] ([phone]).      

## 2017-12-16 NOTE — Anesthesia Procedure Notes (Signed)
Anesthesia Regional Block: Popliteal block   Pre-Anesthetic Checklist: ,, timeout performed, Correct Patient, Correct Site, Correct Laterality, Correct Procedure, Correct Position, site marked, Risks and benefits discussed,  Surgical consent,  Pre-op evaluation,  At surgeon's request and post-op pain management  Laterality: Left  Prep: chloraprep       Needles:  Injection technique: Single-shot  Needle Type: Echogenic Needle     Needle Length: 9cm  Needle Gauge: 21     Additional Needles:   Procedures:,,,, ultrasound used (permanent image in chart),,,,  Narrative:  Start time: 12/16/2017 10:35 AM End time: 12/16/2017 10:45 AM Injection made incrementally with aspirations every 5 mL.  Performed by: Personally  Anesthesiologist: Cecile Hearingurk, Stephen Edward, MD  Additional Notes: No pain on injection. No increased resistance to injection. Injection made in 5cc increments.  Good needle visualization.  Patient tolerated procedure well.  LEFT POPLITEAL/SAPHENOUS NERVE BLOCK.

## 2017-12-16 NOTE — Anesthesia Procedure Notes (Signed)
Procedure Name: LMA Insertion Date/Time: 12/16/2017 11:07 AM Performed by: Gar GibbonKeeton, Terrill Alperin S, CRNA Pre-anesthesia Checklist: Patient identified, Emergency Drugs available, Suction available, Patient being monitored and Timeout performed Patient Re-evaluated:Patient Re-evaluated prior to induction Oxygen Delivery Method: Circle system utilized Preoxygenation: Pre-oxygenation with 100% oxygen Induction Type: IV induction Ventilation: Mask ventilation without difficulty LMA: LMA inserted LMA Size: 4.0 Number of attempts: 1 Airway Equipment and Method: Bite block Placement Confirmation: positive ETCO2 Tube secured with: Tape Dental Injury: Teeth and Oropharynx as per pre-operative assessment

## 2017-12-16 NOTE — Anesthesia Postprocedure Evaluation (Signed)
Anesthesia Post Note  Patient: Wayne [name]  Procedure(s) Performed: OPEN REDUCTION INTERNAL FIXATION (ORIF) LEFT ANKLE AND SYNDESMOSIS (Left Ankle) KNEE ASPIRATION AND CORTISONE INJECTION (Left Knee)     Patient location during evaluation: PACU Anesthesia Type: General Level of consciousness: awake and alert Pain management: pain level controlled Vital Signs Assessment: post-procedure vital signs reviewed and stable Respiratory status: spontaneous breathing, nonlabored ventilation and respiratory function stable Cardiovascular status: blood pressure returned to baseline and stable Postop Assessment: no apparent nausea or vomiting Anesthetic complications: no    Last Vitals:  Vitals:   12/16/17 1315 12/16/17 1330  BP: 135/86 (!) 142/81  Pulse: 91 90  Resp: 17 12  Temp:    SpO2: 100% 100%    Last Pain:  Vitals:   12/16/17 1345  TempSrc:   PainSc: 0-No pain                 Cecile HearingStephen Edward Turk

## 2017-12-16 NOTE — Op Note (Addendum)
Date of Surgery: 12/16/2017  INDICATIONS: Mr. Downie is a 42 y.o.-year-old male who sustained a left ankle fracture; he was indicated for open reduction and internal fixation due to the displaced nature of the articular fracture and came to the operating room today for this procedure. The patient did consent to the procedure after discussion of the risks and benefits.  PREOPERATIVE DIAGNOSIS:  1. Left Weber C fibula ankle fracture with syndesmosis rupture 2. Left knee effusion  POSTOPERATIVE DIAGNOSIS: Same.  PROCEDURE:  1. Open treatment of left ankle fracture with internal fixation of lateral malleolus 2. Open treatment of left ankle syndesmosis rupture with tightrope  3. Left knee injection  SURGEON: N. Glee Arvin, M.D.  ASSIST: Starlyn Skeans [place], [place]; necessary for the timely completion of procedure and due to complexity of procedure.  ANESTHESIA:  general, regional  TOURNIQUET TIME: less than 60 mins  IV FLUIDS AND URINE: See anesthesia.  ESTIMATED BLOOD LOSS: minimal mL.  IMPLANTS: [employer]  COMPLICATIONS: None.  DESCRIPTION OF PROCEDURE: The patient was brought to the operating room and placed supine on the operating table.  The patient had been signed prior to the procedure and this was documented. The patient had the anesthesia placed by the anesthesiologist.  A nonsterile tourniquet was placed on the upper thigh.  The prep verification and incision time-outs were performed to confirm that this was the correct patient, site, side and location. The patient had an SCD on the opposite lower extremity. The patient did receive antibiotics prior to the incision and was re-dosed during the procedure as needed at indicated intervals.  The patient had the lower extremity prepped and draped in the standard surgical fashion.  The extremity was exsanguinated using an esmarch bandage and the tourniquet was inflated to 300 mm Hg.  An incision was made on the lateral aspect of  the distal fibula.  Dissection was carried through the subcutaneous tissue.  The superficial peroneal nerve was identified and mobilized.  We continued our dissection down to the fibula.  Subperiosteal elevation was performed.  The fracture was exposed.  Entrapped periosteum and organized hematoma were removed from the fracture site.  The fracture was brought out to length and alignment and clamped.  Fluoroscopy was used to confirm appropriate reduction.  I then placed a single anterior to posterior lag screw by technique.  The clamp was removed.  I then placed a semitubular plate on the lateral aspect of the fibula at the appropriate position.  Nonlocking screws were placed above and below the fracture using standard AO technique.  Each screw had excellent purchase.  I then performed an external rotation stress exam which showed widening of the medial clear space.  I then used a large periarticular clamp in order to reduce the syndesmosis with the foot at 90 degrees of flexion.  We then placed a single tight rope parallel to the ankle joint using fluoroscopic guidance.  A tight rope was tightened down in order to maintain reduction of the syndesmosis.  The clamp was removed.  Final x-rays were then taken.  The wound was then thoroughly irrigated and closed in layered fashion using 0 Vicryl, 2-0 Vicryl, 3-0 nylon.  Sterile dressings were applied.  Leg was placed in a short leg splint at 90 degrees.  Patient tolerated procedure well had no immediate complications.  Left knee was then injected with 1 cc of depomedrol, 2 cc of 1% lidocaine, and 2 cc of 0.5% bupivicaine under sterile conditions.  Band  aid was applied.  POSTOPERATIVE PLAN: Mr. [name] will remain nonweightbearing on this leg for approximately 6 weeks; Mr. [name] will return for suture removal in 2 weeks.  He will be immobilized in a short leg splint and then transitioned to a CAM walker at his first follow up appointment.  Mr. [name] will receive DVT  prophylaxis based on other medications, activity level, and risk ratio of bleeding to thrombosis.  Mayra ReelN. Michael Ripley Lovecchio, MD [facility] [phone] 12:11 PM

## 2017-12-16 NOTE — Anesthesia Preprocedure Evaluation (Signed)
Anesthesia Evaluation  Patient identified by MRN, date of birth, ID band Patient awake    Reviewed: Allergy & Precautions, NPO status , Patient's Chart, lab work & pertinent test results  Airway Mallampati: II  TM Distance: >3 FB Neck ROM: Full    Dental  (+) Teeth Intact, Dental Advisory Given   Pulmonary neg pulmonary ROS,    Pulmonary exam normal breath sounds clear to auscultation       Cardiovascular hypertension, Pt. on medications Normal cardiovascular exam Rhythm:Regular Rate:Normal     Neuro/Psych negative neurological ROS     GI/Hepatic hiatal hernia, GERD  ,(+)     substance abuse  alcohol use,   Endo/Other  negative endocrine ROSObesity   Renal/GU negative Renal ROS     Musculoskeletal  (+) Arthritis , left distal fibula fracture,syndesmosis rupture, left knee effusion   Abdominal   Peds  Hematology negative hematology ROS (+)   Anesthesia Other Findings Day of surgery medications reviewed with the patient.  Reproductive/Obstetrics                             Anesthesia Physical Anesthesia Plan  ASA: II  Anesthesia Plan: General   Post-op Pain Management:  Regional for Post-op pain   Induction: Intravenous  PONV Risk Score and Plan: 2 and Dexamethasone, Ondansetron and Midazolam  Airway Management Planned: LMA  Additional Equipment:   Intra-op Plan:   Post-operative Plan: Extubation in OR  Informed Consent: I have reviewed the patients History and Physical, chart, labs and discussed the procedure including the risks, benefits and alternatives for the proposed anesthesia with the patient or authorized representative who has indicated his/her understanding and acceptance.   Dental advisory given  Plan Discussed with: CRNA  Anesthesia Plan Comments:         Anesthesia Quick Evaluation

## 2017-12-16 NOTE — Transfer of Care (Signed)
Immediate Anesthesia Transfer of Care Note  Patient: Wayne RobinsonLashuarn [name]  Procedure(s) Performed: OPEN REDUCTION INTERNAL FIXATION (ORIF) LEFT ANKLE AND SYNDESMOSIS (Left Ankle) KNEE ASPIRATION AND CORTISONE INJECTION (Left Knee)  Patient Location: PACU  Anesthesia Type:General  Level of Consciousness: awake  Airway & Oxygen Therapy: Patient Spontanous Breathing and Patient connected to face mask oxygen  Post-op Assessment: Report given to RN and Post -op Vital signs reviewed and stable  Post vital signs: Reviewed and stable  Last Vitals:  Vitals Value Taken Time  BP 151/91 12/16/2017 12:53 PM  Temp    Pulse 99 12/16/2017 12:54 PM  Resp 18 12/16/2017 12:54 PM  SpO2 100 % 12/16/2017 12:54 PM  Vitals shown include unvalidated device data.  Last Pain:  Vitals:   12/16/17 0953  TempSrc: Oral  PainSc: 8       Patients Stated Pain Goal: 0 (12/16/17 0955)  Complications: No apparent anesthesia complications

## 2017-12-17 ENCOUNTER — Encounter (HOSPITAL_BASED_OUTPATIENT_CLINIC_OR_DEPARTMENT_OTHER): Payer: Self-pay | Admitting: Orthopaedic Surgery

## 2017-12-30 ENCOUNTER — Ambulatory Visit (INDEPENDENT_AMBULATORY_CARE_PROVIDER_SITE_OTHER): Payer: Self-pay | Admitting: Orthopaedic Surgery

## 2017-12-30 ENCOUNTER — Encounter (INDEPENDENT_AMBULATORY_CARE_PROVIDER_SITE_OTHER): Payer: Self-pay | Admitting: Orthopaedic Surgery

## 2017-12-30 ENCOUNTER — Ambulatory Visit (INDEPENDENT_AMBULATORY_CARE_PROVIDER_SITE_OTHER): Payer: Self-pay

## 2017-12-30 DIAGNOSIS — M25572 Pain in left ankle and joints of left foot: Secondary | ICD-10-CM

## 2017-12-30 DIAGNOSIS — S8262XA Displaced fracture of lateral malleolus of left fibula, initial encounter for closed fracture: Secondary | ICD-10-CM

## 2017-12-30 DIAGNOSIS — S93432A Sprain of tibiofibular ligament of left ankle, initial encounter: Secondary | ICD-10-CM

## 2017-12-30 MED ORDER — MUPIROCIN 2 % EX OINT: 1.0000 "application " | TOPICAL_OINTMENT | Freq: Two times a day (BID) | CUTANEOUS | 0 refills | Status: DC

## 2017-12-30 MED FILL — MUPIROCIN 2% OINTMENT: 2 | 10 days supply | Qty: 22 | Fill #0
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
## 2017-12-31 ENCOUNTER — Ambulatory Visit (INDEPENDENT_AMBULATORY_CARE_PROVIDER_SITE_OTHER): Payer: Self-pay

## 2017-12-31 ENCOUNTER — Encounter (INDEPENDENT_AMBULATORY_CARE_PROVIDER_SITE_OTHER): Payer: Self-pay

## 2017-12-31 VITALS — Ht 70.0 in | Wt 270.0 lb

## 2017-12-31 DIAGNOSIS — S8262XA Displaced fracture of lateral malleolus of left fibula, initial encounter for closed fracture: Secondary | ICD-10-CM

## 2017-12-31 NOTE — Progress Notes (Signed)
Patient came in for a nurse visit appointment. 1 stitch was left and I removed it today. Applied some Bactroban on wound and placed a Band-Aid on it. Helped patient reapply his CAM BOOT.

## 2018-01-04 ENCOUNTER — Encounter (INDEPENDENT_AMBULATORY_CARE_PROVIDER_SITE_OTHER): Payer: Self-pay

## 2018-01-05 ENCOUNTER — Encounter (INDEPENDENT_AMBULATORY_CARE_PROVIDER_SITE_OTHER): Payer: Self-pay

## 2018-01-11 MED FILL — POLYETHYLENE GLYCOL 3350 PO: 14 days supply | Qty: 476 | Fill #3

## 2018-01-11 MED FILL — ALLOPURINOL 300 MG TAB: 300 | 30 days supply | Qty: 30 | Fill #0

## 2018-01-11 MED FILL — AMLODIPINE BESYLATE 10 MG T: 10 | 30 days supply | Qty: 30 | Fill #9

## 2018-01-11 MED FILL — $COLCRYS 0.6 MG TABLET: 0.6 | 30 days supply | Qty: 30 | Fill #2

## 2018-01-12 ENCOUNTER — Encounter: Payer: Self-pay | Admitting: Internal Medicine

## 2018-01-12 ENCOUNTER — Ambulatory Visit: Payer: Self-pay | Attending: Internal Medicine | Admitting: Internal Medicine

## 2018-01-12 VITALS — BP 139/91 | HR 77 | Temp 98.2°F | Resp 16

## 2018-01-12 DIAGNOSIS — M10061 Idiopathic gout, right knee: Secondary | ICD-10-CM | POA: Insufficient documentation

## 2018-01-12 DIAGNOSIS — F1010 Alcohol abuse, uncomplicated: Secondary | ICD-10-CM | POA: Insufficient documentation

## 2018-01-12 DIAGNOSIS — E669 Obesity, unspecified: Secondary | ICD-10-CM | POA: Insufficient documentation

## 2018-01-12 DIAGNOSIS — D509 Iron deficiency anemia, unspecified: Secondary | ICD-10-CM | POA: Insufficient documentation

## 2018-01-12 DIAGNOSIS — X58XXXS Exposure to other specified factors, sequela: Secondary | ICD-10-CM | POA: Insufficient documentation

## 2018-01-12 DIAGNOSIS — Z79899 Other long term (current) drug therapy: Secondary | ICD-10-CM | POA: Insufficient documentation

## 2018-01-12 DIAGNOSIS — I10 Essential (primary) hypertension: Secondary | ICD-10-CM | POA: Insufficient documentation

## 2018-01-12 DIAGNOSIS — S8262XS Displaced fracture of lateral malleolus of left fibula, sequela: Secondary | ICD-10-CM | POA: Insufficient documentation

## 2018-01-12 DIAGNOSIS — S82892S Other fracture of left lower leg, sequela: Secondary | ICD-10-CM

## 2018-01-12 DIAGNOSIS — E538 Deficiency of other specified B group vitamins: Secondary | ICD-10-CM | POA: Insufficient documentation

## 2018-01-12 DIAGNOSIS — Z7982 Long term (current) use of aspirin: Secondary | ICD-10-CM | POA: Insufficient documentation

## 2018-01-12 MED ORDER — PREDNISONE 10 MG PO TABS: 10.0000 mg | ORAL_TABLET | Freq: Every day | ORAL | 0 refills | Status: DC

## 2018-01-12 MED FILL — predniSONE 10 MG TABS: 10 | 5 days supply | Qty: 5 | Fill #0
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
## 2018-01-12 NOTE — Progress Notes (Signed)
Pt is having pain in his left ankle where he had surgery, his knee and is having a gout flare up

## 2018-01-13 LAB — URIC ACID: Uric Acid: 5.1 mg/dL (ref 3.7–8.6)

## 2018-01-27 ENCOUNTER — Encounter (INDEPENDENT_AMBULATORY_CARE_PROVIDER_SITE_OTHER): Payer: Self-pay | Admitting: Orthopaedic Surgery

## 2018-01-27 ENCOUNTER — Ambulatory Visit (INDEPENDENT_AMBULATORY_CARE_PROVIDER_SITE_OTHER): Payer: Self-pay | Admitting: Physician Assistant

## 2018-01-27 ENCOUNTER — Ambulatory Visit (INDEPENDENT_AMBULATORY_CARE_PROVIDER_SITE_OTHER): Payer: Self-pay

## 2018-01-27 DIAGNOSIS — S8262XA Displaced fracture of lateral malleolus of left fibula, initial encounter for closed fracture: Secondary | ICD-10-CM

## 2018-01-27 MED ORDER — HYDROCODONE-ACETAMINOPHEN 5-325 MG PO TABS: 1.0000 | ORAL_TABLET | Freq: Four times a day (QID) | ORAL | 0 refills | Status: DC | PRN

## 2018-01-27 NOTE — Progress Notes (Signed)
ankle
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
## 2018-01-28 ENCOUNTER — Ambulatory Visit (INDEPENDENT_AMBULATORY_CARE_PROVIDER_SITE_OTHER): Payer: Self-pay | Admitting: Family Medicine

## 2018-01-28 VITALS — BP 132/78 | HR 100 | Temp 98.6°F | Ht 70.0 in | Wt 295.6 lb

## 2018-01-28 DIAGNOSIS — L731 Pseudofolliculitis barbae: Secondary | ICD-10-CM

## 2018-01-28 HISTORY — DX: Pseudofolliculitis barbae: L73.1

## 2018-01-28 MED ORDER — CLINDAMYCIN PHOS-BENZOYL PEROX 1-5 % EX GEL: Freq: Two times a day (BID) | CUTANEOUS | 0 refills | Status: DC

## 2018-01-28 MED FILL — CLINDAMYCIN PHOS-BENZOYL PE: 1-5 | 30 days supply | Qty: 25 | Fill #0

## 2018-01-28 NOTE — Assessment & Plan Note (Signed)
Consistent with pseudofolliculitis barbae. Gave counseling on letting hair grow out in that area. Can use guard on razor to leave some hair in that area. Benzaclin gel two times daily for 4 weeks. Patient to let us know if this fails to resolve.

## 2018-01-28 NOTE — Progress Notes (Signed)
   HPI 42 year old who presents due to bumps on his neck. He states that he first noticed these around 2 years ago. Their size waxes and wanes but they never completely go away. He uses an electric clipper to shave the hair in this area. He states that he first used mupirocin ointment at the behest of his PCP. This did not help at all.  He has had ingrown hairs before, but he does not think this is it. He has not had any discharge, such as pus or blood from the.  CC: neck bumps  ROS:   Review of Systems See HPI for ROS.   CC, SH/smoking status, and VS noted  Objective: BP 132/78   Pulse 100   Temp 98.6 F (37 C) (Oral)   Ht 5\' 10"  (1.778 m)   Wt 295 lb 9.6 oz (134.1 kg)   SpO2 95%   BMI 42.41 kg/m  Gen: NAD, alert, cooperative, and pleasant. AA male. 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 Gave counseling on letting hair grow out in that area. Can use guard on razor to leave some hair in that area. Benzaclin gel two times daily for 4 weeks. Patient to let us know if this fails to resolve.   No orders of the defined types were placed in this encounter.   Meds ordered this encounter  Medications  . clindamycin-benzoyl peroxide (BENZACLIN) gel    Sig: Apply topically 2 (two) times daily.    Dispense:  25 g    Refill:  0     Myrene BuddyJacob Zarayah Lanting MD PGY-2 Family Medicine Resident  01/28/2018 2:35 PM

## 2018-01-28 NOTE — Addendum Note (Signed)
Addended by: Janit PaganENIOLA, Jaeveon Ashland T on: 01/28/2018 06:52 PM   Modules accepted: Level of Service

## 2018-01-28 NOTE — Patient Instructions (Addendum)
It was great meeting you today [name]! I think that your bumps are likely due to shaving the hair down with a razor. I would recommend shaving that part down with an electric razor with a guard. I will also give you an ointment. It has an antibiotic and other substances in to help decrease the bumps and prevent scarring down the road.

## 2018-02-01 ENCOUNTER — Other Ambulatory Visit: Payer: Self-pay | Admitting: Internal Medicine

## 2018-02-01 DIAGNOSIS — Z8739 Personal history of other diseases of the musculoskeletal system and connective tissue: Secondary | ICD-10-CM

## 2018-02-01 MED FILL — AMLODIPINE BESYLATE 10 MG T: 10 | 30 days supply | Qty: 30 | Fill #10

## 2018-02-01 MED FILL — ALLOPURINOL 300 MG TAB: 300 | 30 days supply | Qty: 30 | Fill #1

## 2018-02-01 MED FILL — POLYETHYLENE GLYCOL 3350 PO: 14 days supply | Qty: 510 | Fill #0

## 2018-02-03 MED FILL — $COLCRYS 0.6 MG TABLET: 0.6 | 90 days supply | Qty: 90 | Fill #0

## 2018-02-09 ENCOUNTER — Telehealth (INDEPENDENT_AMBULATORY_CARE_PROVIDER_SITE_OTHER): Payer: Self-pay | Admitting: Orthopaedic Surgery

## 2018-02-09 ENCOUNTER — Ambulatory Visit: Payer: Self-pay | Attending: Physician Assistant | Admitting: Physical Therapy

## 2018-02-09 ENCOUNTER — Other Ambulatory Visit: Payer: Self-pay

## 2018-02-09 DIAGNOSIS — M25672 Stiffness of left ankle, not elsewhere classified: Secondary | ICD-10-CM | POA: Insufficient documentation

## 2018-02-09 DIAGNOSIS — R600 Localized edema: Secondary | ICD-10-CM | POA: Insufficient documentation

## 2018-02-09 DIAGNOSIS — R2689 Other abnormalities of gait and mobility: Secondary | ICD-10-CM | POA: Insufficient documentation

## 2018-02-09 NOTE — Patient Instructions (Signed)
Gastroc / Heel Cord Stretch - Seated With Towel   Sit on floor, towel around ball of foot. Gently pull foot in toward body, stretching heel cord and calf. Hold for _30__ seconds. Repeat _3__ times. Do 3-4___ times per day.     Ankle Circles   Slowly rotate right foot and ankle clockwise then counterclockwise. Gradually increase range of motion. Avoid pain. Circle __10__ times each direction per set. Do ____ sets per session. Do 2-3____ sessions per day.  http://orth.exer.us/30   Copyright  VHI. All rights reserved.    ROM: Plantar / Dorsiflexion   With left leg relaxed, gently flex and extend ankle. Move through full range of motion. Avoid pain. Repeat __20__ times per set. Do ____ sets per session. Do __3-4__ sessions per day.  http://orth.exer.us/34   Copyright  VHI. All rights reserved.    ROM: Inversion / Eversion   With left leg relaxed, gently turn ankle and foot in and out. Move through full range of motion. Avoid pain. Repeat _20___ times per set. Do ____ sets per session. Do _3-4___ sessions per day.  http://orth.exer.us/36   Copyright  VHI. All rights reserved.    Solon Palm, PT 02/09/18 11:06 AM [facility] [address] [place], [place], [address] Phone: [phone]   Fax:  [phone]
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
## 2018-02-09 NOTE — Telephone Encounter (Signed)
Patient requesting rx refill on norco. # [phone]

## 2018-02-10 ENCOUNTER — Other Ambulatory Visit (INDEPENDENT_AMBULATORY_CARE_PROVIDER_SITE_OTHER): Payer: Self-pay

## 2018-02-10 MED ORDER — HYDROCODONE-ACETAMINOPHEN 5-325 MG PO TABS: 1.0000 | ORAL_TABLET | Freq: Two times a day (BID) | ORAL | 0 refills | Status: DC

## 2018-02-10 NOTE — Telephone Encounter (Signed)

## 2018-02-10 NOTE — Telephone Encounter (Signed)
Rx Ready for pick up. Called patient he is aware.

## 2018-02-10 NOTE — Telephone Encounter (Signed)
Please advise 

## 2018-02-17 ENCOUNTER — Ambulatory Visit: Payer: Self-pay | Admitting: Physical Therapy

## 2018-02-17 ENCOUNTER — Encounter: Payer: Self-pay | Admitting: Physical Therapy

## 2018-02-17 DIAGNOSIS — M25672 Stiffness of left ankle, not elsewhere classified: Secondary | ICD-10-CM

## 2018-02-17 DIAGNOSIS — R600 Localized edema: Secondary | ICD-10-CM

## 2018-02-17 DIAGNOSIS — R2689 Other abnormalities of gait and mobility: Secondary | ICD-10-CM
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
## 2018-02-19 ENCOUNTER — Telehealth (INDEPENDENT_AMBULATORY_CARE_PROVIDER_SITE_OTHER): Payer: Self-pay | Admitting: Orthopaedic Surgery

## 2018-02-19 ENCOUNTER — Ambulatory Visit: Payer: Self-pay | Admitting: Physical Therapy

## 2018-02-19 NOTE — Telephone Encounter (Signed)
Refill #14.  1-2 tab daily prn

## 2018-02-19 NOTE — Telephone Encounter (Signed)
Medication refill  Hydrocodone-acetaminophen(Norco)

## 2018-02-19 NOTE — Telephone Encounter (Signed)
Please advise, Rx refill request

## 2018-02-22 ENCOUNTER — Other Ambulatory Visit (INDEPENDENT_AMBULATORY_CARE_PROVIDER_SITE_OTHER): Payer: Self-pay

## 2018-02-22 MED ORDER — HYDROCODONE-ACETAMINOPHEN 5-325 MG PO TABS: ORAL_TABLET | ORAL | 0 refills | Status: DC

## 2018-02-22 NOTE — Telephone Encounter (Signed)
Can you print for him to sign?  Thanks

## 2018-02-22 NOTE — Telephone Encounter (Signed)
Rx printed. Will be ready for pick up tomorrow AM when Dr Roda ShuttersXu is in the office.

## 2018-02-23 NOTE — Telephone Encounter (Signed)
Called patient to advise rx ready for pick up

## 2018-02-24 ENCOUNTER — Encounter: Payer: Self-pay | Admitting: Physical Therapy

## 2018-02-24 ENCOUNTER — Ambulatory Visit: Payer: Self-pay | Admitting: Physical Therapy

## 2018-02-24 DIAGNOSIS — R2689 Other abnormalities of gait and mobility: Secondary | ICD-10-CM

## 2018-02-24 DIAGNOSIS — M25672 Stiffness of left ankle, not elsewhere classified: Secondary | ICD-10-CM

## 2018-02-24 DIAGNOSIS — R600 Localized edema: Secondary | ICD-10-CM
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
## 2018-03-03 ENCOUNTER — Ambulatory Visit: Payer: Self-pay | Admitting: Physical Therapy

## 2018-03-03 ENCOUNTER — Encounter: Payer: Self-pay | Admitting: Physical Therapy

## 2018-03-03 DIAGNOSIS — R2689 Other abnormalities of gait and mobility: Secondary | ICD-10-CM

## 2018-03-03 DIAGNOSIS — R600 Localized edema: Secondary | ICD-10-CM

## 2018-03-03 DIAGNOSIS — M25672 Stiffness of left ankle, not elsewhere classified: Secondary | ICD-10-CM
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
## 2018-03-09 ENCOUNTER — Other Ambulatory Visit: Payer: Self-pay | Admitting: Internal Medicine

## 2018-03-09 ENCOUNTER — Other Ambulatory Visit: Payer: Self-pay | Admitting: Family Medicine

## 2018-03-09 MED ORDER — CLINDAMYCIN PHOS-BENZOYL PEROX 1-5 % EX GEL: Freq: Two times a day (BID) | CUTANEOUS | 0 refills | Status: DC

## 2018-03-09 MED FILL — CLINDAMYCIN PHOS-BENZOYL PE: 1-5 | 28 days supply | Qty: 25 | Fill #0

## 2018-03-09 NOTE — Telephone Encounter (Signed)
Pt contacted and informed of pcp one time refill and the need to seek care from PCP for any additional. Pt voiced understanding.

## 2018-03-09 NOTE — Telephone Encounter (Signed)
Please call to inform patient.  1. One time refill done. 2. Need to follow-up with his PCP for subsequent refills. 3. Before we do another refill he will need to follow-up in the Derm clinic, otherwise PCP refill is appropriate.

## 2018-03-09 NOTE — Telephone Encounter (Signed)
Pt was seen in our dermatology clinic and now needs a refill on the medication that was prescribed in the dermatology clinic. Please send to [facility] pharmacy. jw

## 2018-03-09 NOTE — Telephone Encounter (Signed)
Will forward to Dr. Lum Babe to see if patient needs to ask his PCP for this refill or If she can fill it.  Jazmin Hartsell,CMA

## 2018-03-10 ENCOUNTER — Ambulatory Visit (INDEPENDENT_AMBULATORY_CARE_PROVIDER_SITE_OTHER): Payer: Self-pay | Admitting: Orthopaedic Surgery

## 2018-03-10 ENCOUNTER — Encounter (INDEPENDENT_AMBULATORY_CARE_PROVIDER_SITE_OTHER): Payer: Self-pay | Admitting: Orthopaedic Surgery

## 2018-03-10 ENCOUNTER — Ambulatory Visit (INDEPENDENT_AMBULATORY_CARE_PROVIDER_SITE_OTHER): Payer: Self-pay

## 2018-03-10 DIAGNOSIS — S93432A Sprain of tibiofibular ligament of left ankle, initial encounter: Secondary | ICD-10-CM

## 2018-03-10 DIAGNOSIS — S8262XA Displaced fracture of lateral malleolus of left fibula, initial encounter for closed fracture: Secondary | ICD-10-CM

## 2018-03-10 MED ORDER — NAPROXEN 500 MG PO TABS: 500.0000 mg | ORAL_TABLET | Freq: Two times a day (BID) | ORAL | 3 refills | Status: DC
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
## 2018-03-11 ENCOUNTER — Ambulatory Visit: Payer: Self-pay | Attending: Physician Assistant | Admitting: Physical Therapy

## 2018-03-11 ENCOUNTER — Encounter: Payer: Self-pay | Admitting: Physical Therapy

## 2018-03-11 DIAGNOSIS — R2689 Other abnormalities of gait and mobility: Secondary | ICD-10-CM | POA: Insufficient documentation

## 2018-03-11 DIAGNOSIS — R600 Localized edema: Secondary | ICD-10-CM | POA: Insufficient documentation

## 2018-03-11 DIAGNOSIS — M25672 Stiffness of left ankle, not elsewhere classified: Secondary | ICD-10-CM | POA: Insufficient documentation
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
## 2018-03-17 ENCOUNTER — Ambulatory Visit: Payer: Self-pay | Admitting: Physical Therapy

## 2018-03-22 MED FILL — ALLOPURINOL 300 MG TAB: 300 | 30 days supply | Qty: 30 | Fill #2

## 2018-03-22 MED FILL — POLYETHYLENE GLYCOL 3350 PO: 13 days supply | Qty: 476 | Fill #1

## 2018-03-22 MED FILL — AMLODIPINE BESYLATE 10 MG T: 10 | 30 days supply | Qty: 30 | Fill #11

## 2018-03-23 ENCOUNTER — Ambulatory Visit: Payer: Self-pay | Admitting: Physical Therapy

## 2018-03-24 MED FILL — NAPROXEN 500 MG TABLET: 500 | 15 days supply | Qty: 30 | Fill #0

## 2018-04-21 ENCOUNTER — Other Ambulatory Visit: Payer: Self-pay | Admitting: Internal Medicine

## 2018-04-21 DIAGNOSIS — I10 Essential (primary) hypertension: Secondary | ICD-10-CM

## 2018-04-21 DIAGNOSIS — Z8739 Personal history of other diseases of the musculoskeletal system and connective tissue: Secondary | ICD-10-CM

## 2018-04-21 MED FILL — ALLOPURINOL 300 MG TAB: 300 | 30 days supply | Qty: 30 | Fill #3

## 2018-04-21 MED FILL — NAPROXEN 500 MG TABLET: 500 | 15 days supply | Qty: 30 | Fill #1

## 2018-04-21 MED FILL — POLYETHYLENE GLYCOL 3350 PO: 13 days supply | Qty: 476 | Fill #2

## 2018-04-23 MED FILL — $COLCRYS 0.6 MG TABLET: 0.6 | 30 days supply | Qty: 30 | Fill #0

## 2018-04-23 MED FILL — AMLODIPINE BESYLATE 10 MG T: 10 | 30 days supply | Qty: 30 | Fill #0

## 2018-05-11 ENCOUNTER — Encounter (INDEPENDENT_AMBULATORY_CARE_PROVIDER_SITE_OTHER): Payer: Self-pay | Admitting: Orthopaedic Surgery

## 2018-05-11 ENCOUNTER — Ambulatory Visit (INDEPENDENT_AMBULATORY_CARE_PROVIDER_SITE_OTHER): Payer: Self-pay

## 2018-05-11 ENCOUNTER — Ambulatory Visit (INDEPENDENT_AMBULATORY_CARE_PROVIDER_SITE_OTHER): Payer: Self-pay | Admitting: Orthopaedic Surgery

## 2018-05-11 DIAGNOSIS — M25572 Pain in left ankle and joints of left foot: Secondary | ICD-10-CM

## 2018-05-11 DIAGNOSIS — Z6841 Body Mass Index (BMI) 40.0 and over, adult: Secondary | ICD-10-CM

## 2018-05-11 HISTORY — DX: Body Mass Index (BMI) 40.0 and over, adult: Z684

## 2018-05-11 HISTORY — DX: Pain in left ankle and joints of left foot: M25.572
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
## 2018-05-21 ENCOUNTER — Other Ambulatory Visit: Payer: Self-pay | Admitting: Internal Medicine

## 2018-05-21 DIAGNOSIS — I10 Essential (primary) hypertension: Secondary | ICD-10-CM

## 2018-05-21 MED FILL — $COLCRYS 0.6 MG TABLET: 0.6 | 30 days supply | Qty: 30 | Fill #1

## 2018-05-21 MED FILL — ALLOPURINOL 300 MG TAB: 300 | 30 days supply | Qty: 30 | Fill #4

## 2018-05-21 MED FILL — NAPROXEN 500 MG TABLET: 500 | 15 days supply | Qty: 30 | Fill #2

## 2018-05-24 ENCOUNTER — Other Ambulatory Visit: Payer: Self-pay | Admitting: Internal Medicine

## 2018-05-24 ENCOUNTER — Other Ambulatory Visit: Payer: Self-pay

## 2018-05-24 DIAGNOSIS — I10 Essential (primary) hypertension: Secondary | ICD-10-CM

## 2018-05-24 MED ORDER — AMLODIPINE BESYLATE 10 MG PO TABS: 10.0000 mg | ORAL_TABLET | Freq: Every day | ORAL | 2 refills | Status: DC

## 2018-05-24 MED ORDER — POLYETHYLENE GLYCOL 3350 17 GM/SCOOP PO POWD: ORAL | 2 refills | Status: DC

## 2018-05-24 MED FILL — POLYETHYLENE GLYCOL 3350 PO: 14 days supply | Qty: 476 | Fill #0

## 2018-05-24 MED FILL — AMLODIPINE BESYLATE 10 MG T: 10 | 30 days supply | Qty: 30 | Fill #0

## 2018-06-03 ENCOUNTER — Emergency Department (HOSPITAL_COMMUNITY)
Admission: EM | Admit: 2018-06-03 | Discharge: 2018-06-03 | Disposition: A | Discharge status: Home / Self Care | Payer: Self-pay | Attending: Emergency Medicine | Admitting: Emergency Medicine

## 2018-06-03 ENCOUNTER — Other Ambulatory Visit: Payer: Self-pay

## 2018-06-03 ENCOUNTER — Emergency Department (HOSPITAL_COMMUNITY): Payer: Self-pay

## 2018-06-03 DIAGNOSIS — Z7982 Long term (current) use of aspirin: Secondary | ICD-10-CM | POA: Insufficient documentation

## 2018-06-03 DIAGNOSIS — J0100 Acute maxillary sinusitis, unspecified: Secondary | ICD-10-CM | POA: Insufficient documentation

## 2018-06-03 DIAGNOSIS — I10 Essential (primary) hypertension: Secondary | ICD-10-CM | POA: Insufficient documentation

## 2018-06-03 DIAGNOSIS — R0981 Nasal congestion: Secondary | ICD-10-CM | POA: Insufficient documentation

## 2018-06-03 DIAGNOSIS — Z79899 Other long term (current) drug therapy: Secondary | ICD-10-CM | POA: Insufficient documentation

## 2018-06-03 DIAGNOSIS — J40 Bronchitis, not specified as acute or chronic: Secondary | ICD-10-CM | POA: Insufficient documentation

## 2018-06-03 DIAGNOSIS — R0789 Other chest pain: Secondary | ICD-10-CM | POA: Insufficient documentation

## 2018-06-03 LAB — CBC
HCT: 48.4 % (ref 39.0–52.0)
Hemoglobin: 15.4 g/dL (ref 13.0–17.0)
MCH: 29.7 pg (ref 26.0–34.0)
MCHC: 31.8 g/dL (ref 30.0–36.0)
MCV: 93.4 fL (ref 80.0–100.0)
Platelets: 178 10*3/uL (ref 150–400)
RBC: 5.18 MIL/uL (ref 4.22–5.81)
RDW: 11.8 % (ref 11.5–15.5)
WBC: 5 10*3/uL (ref 4.0–10.5)
nRBC: 0 % (ref 0.0–0.2)

## 2018-06-03 LAB — BASIC METABOLIC PANEL
Anion gap: 11 (ref 5–15)
BUN: 17 mg/dL (ref 6–20)
CO2: 21 mmol/L — ABNORMAL LOW (ref 22–32)
Calcium: 9.6 mg/dL (ref 8.9–10.3)
Chloride: 107 mmol/L (ref 98–111)
Creatinine, Ser: 1.09 mg/dL (ref 0.61–1.24)
GFR calc Af Amer: >60 mL/min (ref >60)
GFR calc non Af Amer: >60 mL/min (ref >60)
Glucose, Bld: 94 mg/dL (ref 70–99)
Potassium: 3.9 mmol/L (ref 3.5–5.1)
Sodium: 139 mmol/L (ref 135–145)

## 2018-06-03 LAB — I-STAT TROPONIN, ED: Troponin i, poc: 0.01 ng/mL (ref 0.00–0.08)

## 2018-06-03 LAB — D-DIMER, QUANTITATIVE: D-Dimer, Quant: <0.27 ug/mL-FEU (ref 0.00–0.50)

## 2018-06-03 MED ORDER — LORATADINE 10 MG PO TABS: 10.0000 mg | ORAL_TABLET | Freq: Every day | ORAL | 0 refills | Status: DC

## 2018-06-03 MED ORDER — BENZONATATE 100 MG PO CAPS: 100.0000 mg | ORAL_CAPSULE | Freq: Three times a day (TID) | ORAL | 0 refills | Status: DC

## 2018-06-03 MED ORDER — KETOROLAC TROMETHAMINE 30 MG/ML IJ SOLN
30.0000 mg | Freq: Once | INTRAMUSCULAR | Status: AC
  Administered 2018-06-03: 30 mg via INTRAVENOUS
  Filled 2018-06-03: qty 1

## 2018-06-03 MED ORDER — FLUTICASONE PROPIONATE 50 MCG/ACT NA SUSP: 2.0000 | Freq: Every day | NASAL | 0 refills | Status: DC

## 2018-06-03 MED ORDER — METHOCARBAMOL 500 MG PO TABS: 1000.0000 mg | ORAL_TABLET | Freq: Three times a day (TID) | ORAL | 0 refills | Status: DC | PRN

## 2018-06-03 MED ORDER — AZITHROMYCIN 250 MG PO TABS: 250.0000 mg | ORAL_TABLET | Freq: Every day | ORAL | 0 refills | Status: DC

## 2018-06-03 NOTE — ED Notes (Signed)
Patient transported to X-ray 
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
## 2018-06-03 NOTE — ED Notes (Signed)
Pt verbalizes understanding of d/c instructions. Prescriptions reviewed with patient. Pt ambulatory at d/c with all belongings and with family.   

## 2018-06-03 NOTE — ED Triage Notes (Signed)
Pt reports nasal congestion x 1 week. Pt reports central CP with sneezing and movement. Pt reports some SHOB on exertion and headache. Pt reports hx HTN. Pt denies sick contact, fever.

## 2018-06-04 MED FILL — AZITHROMYCIN 250 MG TABLET: 250 | 5 days supply | Qty: 6 | Fill #0

## 2018-06-04 MED FILL — METHOCARBAMOL 500 MG TABS: 500 | 5 days supply | Qty: 30 | Fill #0

## 2018-06-04 MED FILL — BENZONATATE 100 MG CAP: 100 | 7 days supply | Qty: 21 | Fill #0

## 2018-06-04 MED FILL — ?LORATADINE 10 MG TABS: 10 | 30 days supply | Qty: 30 | Fill #0

## 2018-06-04 MED FILL — FLUTICASONE PROP 50 MCG SPR: 50 | 30 days supply | Qty: 16 | Fill #0

## 2018-06-23 MED FILL — AMLODIPINE BESYLATE 10 MG T: 10 | 30 days supply | Qty: 30 | Fill #1

## 2018-06-23 MED FILL — $COLCRYS 0.6 MG TABLET: 0.6 | 30 days supply | Qty: 30 | Fill #2

## 2018-06-23 MED FILL — POLYETHYLENE GLYCOL 3350 PO: 14 days supply | Qty: 476 | Fill #1

## 2018-06-23 MED FILL — ALLOPURINOL 300 MG TAB: 300 | 30 days supply | Qty: 30 | Fill #5

## 2018-06-23 MED FILL — NAPROXEN 500 MG TABLET: 500 | 15 days supply | Qty: 30 | Fill #3

## 2018-07-26 ENCOUNTER — Other Ambulatory Visit: Payer: Self-pay | Admitting: Internal Medicine

## 2018-07-26 DIAGNOSIS — Z8739 Personal history of other diseases of the musculoskeletal system and connective tissue: Secondary | ICD-10-CM

## 2018-07-26 MED FILL — POLYETHYLENE GLYCOL 3350 PO: 17 | 14 days supply | Qty: 476 | Fill #2

## 2018-07-26 MED FILL — AMLODIPINE BESYLATE 10 MG T: 10 | 30 days supply | Qty: 30 | Fill #2

## 2018-07-28 MED FILL — !COLCRYS 0.6 MG TABLET: 0.6 MG | 30 days supply | Qty: 30 | Fill #0

## 2018-07-28 MED FILL — ALLOPURINOL 300 MG TAB: 300 | 30 days supply | Qty: 30 | Fill #0

## 2018-07-29 ENCOUNTER — Other Ambulatory Visit: Payer: Self-pay | Admitting: Family Medicine

## 2018-07-29 ENCOUNTER — Encounter: Payer: Self-pay | Admitting: Internal Medicine

## 2018-07-29 ENCOUNTER — Ambulatory Visit: Payer: Self-pay | Attending: Internal Medicine | Admitting: Internal Medicine

## 2018-07-29 VITALS — BP 159/89 | HR 87 | Temp 97.8°F | Resp 16 | Ht 70.0 in | Wt 338.0 lb

## 2018-07-29 DIAGNOSIS — I10 Essential (primary) hypertension: Secondary | ICD-10-CM

## 2018-07-29 DIAGNOSIS — Z6841 Body Mass Index (BMI) 40.0 and over, adult: Secondary | ICD-10-CM

## 2018-07-29 DIAGNOSIS — Z8739 Personal history of other diseases of the musculoskeletal system and connective tissue: Secondary | ICD-10-CM

## 2018-07-29 DIAGNOSIS — L739 Follicular disorder, unspecified: Secondary | ICD-10-CM

## 2018-07-29 DIAGNOSIS — L918 Other hypertrophic disorders of the skin: Secondary | ICD-10-CM

## 2018-07-29 MED ORDER — AMLODIPINE BESYLATE 10 MG PO TABS: 10.0000 mg | ORAL_TABLET | Freq: Every day | ORAL | 6 refills | Status: DC

## 2018-07-29 MED ORDER — FUROSEMIDE 20 MG PO TABS: ORAL_TABLET | ORAL | 3 refills | Status: DC

## 2018-07-29 MED ORDER — ALLOPURINOL 300 MG PO TABS: 300.0000 mg | ORAL_TABLET | Freq: Every day | ORAL | 6 refills | Status: DC

## 2018-07-29 MED ORDER — COLCHICINE 0.6 MG PO TABS: 0.6000 mg | ORAL_TABLET | Freq: Every day | ORAL | 6 refills | Status: DC

## 2018-07-29 MED FILL — ?FUROSEMIDE 20 MG TABLET: 20 | 30 days supply | Qty: 30 | Fill #0

## 2018-07-29 NOTE — Patient Instructions (Signed)
Consider going to the [facility] to do water aerobics for exercise.    Follow a Healthy Eating Plan - You can do it! Limit sugary drinks.  Avoid sodas, sweet tea, sport or energy drinks, or fruit drinks.  Drink water, lo-fat milk, or diet drinks. Limit snack foods.   Cut back on candy, cake, cookies, chips, ice cream.  These are a special treat, only in small amounts. Eat plenty of vegetables.  Especially dark green, red, and orange vegetables. Aim for at least 3 servings a day. More is better! Include fruit in your daily diet.  Whole fruit is much healthier than fruit juice! Limit "white" bread, "white" pasta, "white" rice.   Choose "100% whole grain" products, brown or wild rice. Avoid fatty meats. Try "Meatless Monday" and choose eggs or beans one day a week.  When eating meat, choose lean meats like chicken, [place], and fish.  Grill, broil, or bake meats instead of frying, and eat poultry without the skin. Eat less salt.  Avoid frozen pizzas, frozen dinners and salty foods.  Use seasonings other than salt in cooking.  This can help blood pressure and keep you from swelling Beer, wine and liquor have calories.  If you can safely drink alcohol, limit to 1 drink per day for women, 2 drinks for men

## 2018-07-29 NOTE — Progress Notes (Signed)
Pt is needing a refill on mirlax
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
## 2018-08-04 ENCOUNTER — Other Ambulatory Visit: Payer: Self-pay | Admitting: Internal Medicine

## 2018-08-04 MED ORDER — CLINDAMYCIN PHOS-BENZOYL PEROX 1-5 % EX GEL: Freq: Two times a day (BID) | CUTANEOUS | 1 refills | Status: AC

## 2018-08-04 MED FILL — CLINDAMYCIN PHOS-BENZOYL PE: 1-5 | 28 days supply | Qty: 25 | Fill #0

## 2018-08-26 MED FILL — AMLODIPINE BESYLATE 10 MG T: 10 | 30 days supply | Qty: 30 | Fill #0

## 2018-08-26 MED FILL — $COLCRYS 0.6 MG TABLET: 0.6 | 90 days supply | Qty: 90 | Fill #0

## 2018-08-26 MED FILL — POLYETHYLENE GLYCOL 3350 PO: 17 | 15 days supply | Qty: 238 | Fill #0

## 2018-08-26 MED FILL — CLINDAMYCIN PHOS-BENZOYL PE: 1-5 | 28 days supply | Qty: 25 | Fill #1

## 2018-08-26 MED FILL — ALLOPURINOL 300 MG TAB: 300 | 30 days supply | Qty: 30 | Fill #0

## 2018-09-24 MED FILL — POLYETHYLENE GLYCOL 3350 PO: 17 | 15 days supply | Qty: 238 | Fill #1

## 2018-09-24 MED FILL — ?AMLODIPINE BESYLATE 10 MG: 10 | 30 days supply | Qty: 30 | Fill #1

## 2018-09-24 MED FILL — ?ALLOPURINOL 300 MG TABLET: 300 | 30 days supply | Qty: 30 | Fill #1

## 2018-10-25 MED FILL — ?ALLOPURINOL 300 MG TABLET: 300 | 90 days supply | Qty: 90 | Fill #2

## 2018-10-25 MED FILL — POLYETHYLENE GLYCOL 3350 PO: 17 | 15 days supply | Qty: 238 | Fill #2

## 2018-10-25 MED FILL — ?AMLODIPINE BESYLATE 10 MG: 10 | 90 days supply | Qty: 90 | Fill #2

## 2018-10-25 MED FILL — $COLCRYS 0.6 MG TABLET: 0.6 | 90 days supply | Qty: 90 | Fill #1

## 2018-11-30 MED FILL — POLYETHYLENE GLYCOL 3350 PO: 17 | 15 days supply | Qty: 238 | Fill #3

## 2019-01-25 ENCOUNTER — Telehealth: Payer: Self-pay | Admitting: Internal Medicine

## 2019-01-25 NOTE — Telephone Encounter (Signed)
Pt would like a handicap form filled out..please follow up

## 2019-01-25 NOTE — Telephone Encounter (Signed)
Will forward to pcp. Will have Dr. Wynetta Emery sign one when she returns on Thursday

## 2019-01-27 MED FILL — POLYETHYLENE GLYCOL 3350 PO: 17 | 15 days supply | Qty: 238 | Fill #4

## 2019-01-27 MED FILL — ?AMLODIPINE BESYLATE 10 MG: 10 | 60 days supply | Qty: 60 | Fill #3

## 2019-01-27 MED FILL — ?ALLOPURINOL 300 MG TABLET: 300 | 60 days supply | Qty: 60 | Fill #3

## 2019-01-27 MED FILL — $COLCRYS 0.6 MG TABLET: 0.6 | 30 days supply | Qty: 30 | Fill #2

## 2019-01-31 ENCOUNTER — Ambulatory Visit: Payer: Self-pay | Admitting: Internal Medicine

## 2019-02-04 ENCOUNTER — Encounter: Payer: Self-pay | Admitting: Internal Medicine

## 2019-02-04 ENCOUNTER — Ambulatory Visit: Payer: Self-pay | Attending: Internal Medicine | Admitting: Internal Medicine

## 2019-02-04 ENCOUNTER — Other Ambulatory Visit: Payer: Self-pay

## 2019-02-04 DIAGNOSIS — I10 Essential (primary) hypertension: Secondary | ICD-10-CM

## 2019-02-04 DIAGNOSIS — M25472 Effusion, left ankle: Secondary | ICD-10-CM

## 2019-02-04 DIAGNOSIS — S82892S Other fracture of left lower leg, sequela: Secondary | ICD-10-CM

## 2019-02-04 DIAGNOSIS — Z8739 Personal history of other diseases of the musculoskeletal system and connective tissue: Secondary | ICD-10-CM

## 2019-02-04 MED ORDER — AMLODIPINE BESYLATE 10 MG PO TABS: 10.0000 mg | ORAL_TABLET | Freq: Every day | ORAL | 6 refills | Status: DC

## 2019-02-04 MED ORDER — ALLOPURINOL 300 MG PO TABS: 300.0000 mg | ORAL_TABLET | Freq: Every day | ORAL | 6 refills | Status: DC

## 2019-02-04 MED ORDER — FUROSEMIDE 20 MG PO TABS: ORAL_TABLET | ORAL | 3 refills | Status: DC

## 2019-02-04 MED ORDER — COLCHICINE 0.6 MG PO TABS: 0.6000 mg | ORAL_TABLET | Freq: Every day | ORAL | 6 refills | Status: DC

## 2019-02-04 MED FILL — ?FUROSEMIDE 20MG TABLET: 20 | 30 days supply | Qty: 30 | Fill #0

## 2019-02-04 NOTE — Progress Notes (Signed)
Virtual Visit via Telephone Note Patient apparently had come for an in person visit but forgot to check in at the front desk.  When he did checking he decided he did not want to wait any longer so we requested that it be a telephone visit. I connected with Wayne [name] on 02/04/19 at 12:19 p.m by telephone and verified that I am speaking with the correct person using two identifiers. I am in my office.  The patient is at home.  Only the patient and myself participated in this encounter.  I discussed the limitations, risks, security and privacy concerns of performing an evaluation and management service by telephone and the availability of in person appointments. I also discussed with the patient that there may be a patient responsible charge related to this service. The patient expressed understanding and agreed to proceed.   History of Present Illness: Patient with history of gout (dx via jt fluid exam 10+ yrs ago per pt), rectal bleeding, HTN, EtOH use disorder, iron deficiency anemia.  Patient was last seen in February of this year.  Purpose of today's visit is chronic disease management.   Continues to have swelling and pain LT ankle.  Sometimes the skin scabbes over and itches.  When he scratch it it bleeds.  Has some steroid cream (Triamcinolone) at home that he uses PRN. -recently got compression socks which has helped. -Given Lasix to use PRN but has not been using it.  He forgot I gave him a rxn for this.  HTN:  Compliant with Norvasc and salt restriction.  No device to check blood pressure No CP/SOB  Gout: had a few early flares which goes away by taking an extra Colchicine.   Outpatient Encounter Medications as of 02/04/2019  Medication Sig  . allopurinol (ZYLOPRIM) 300 MG tablet Take 1 tablet (300 mg total) by mouth daily.  Marland Kitchen amLODipine (NORVASC) 10 MG tablet Take 1 tablet (10 mg total) by mouth daily.  Marland Kitchen aspirin EC 81 MG tablet Take 1 tablet (81 mg total) by mouth 2 (two) times  daily. (Patient not taking: Reported on 06/03/2018)  . calcium-vitamin D (OSCAL WITH D) 500-200 MG-UNIT tablet Take 1 tablet by mouth 3 (three) times daily. (Patient not taking: Reported on 06/03/2018)  . colchicine (COLCRYS) 0.6 MG tablet Take 1 tablet (0.6 mg total) by mouth daily.  . furosemide (LASIX) 20 MG tablet 1 tab PO daily PRN for lower extremity swelling  . naproxen (NAPROSYN) 500 MG tablet Take 1 tablet (500 mg total) by mouth 2 (two) times daily with a meal. (Patient not taking: Reported on 07/29/2018)   No facility-administered encounter medications on file as of 02/04/2019.     Observations/Objective: No direct observation done as this was a tele visit  Assessment and Plan: 1. Essential hypertension Level of control unknown as he does not have a device to check blood pressure.  Advised to continue taking amlodipine and furosemide as needed and to limit salt in the foods. - amLODipine (NORVASC) 10 MG tablet; Take 1 tablet (10 mg total) by mouth daily.  Dispense: 30 tablet; Refill: 6 - furosemide (LASIX) 20 MG tablet; 1 tab PO daily PRN for lower extremity swelling  Dispense: 30 tablet; Refill: 3  2. Closed fracture of left ankle, sequela We will have him follow-up with the orthopedic surgeon since he continues to have pain and swelling in this ankle. - Ambulatory referral to Orthopedic Surgery  3. Hx of gout - allopurinol (ZYLOPRIM) 300 MG tablet; Take 1  tablet (300 mg total) by mouth daily.  Dispense: 30 tablet; Refill: 6 - colchicine (COLCRYS) 0.6 MG tablet; Take 1 tablet (0.6 mg total) by mouth daily.  Dispense: 30 tablet; Refill: 6  4. Edema of left ankle Encouraged him to wear the compression socks and to use furosemide as needed - Ambulatory referral to Orthopedic Surgery   Follow Up Instructions:  3 mths CMA to schedule him to come for flu vaccine   I discussed the assessment and treatment plan with the patient. The patient was provided an opportunity to ask  questions and all were answered. The patient agreed with the plan and demonstrated an understanding of the instructions.   The patient was advised to call back or seek an in-person evaluation if the symptoms worsen or if the condition fails to improve as anticipated.  I provided 9 minutes of non-face-to-face time during this encounter.   [name], MD

## 2019-02-09 ENCOUNTER — Encounter: Payer: Self-pay | Admitting: Orthopaedic Surgery

## 2019-02-09 ENCOUNTER — Ambulatory Visit (INDEPENDENT_AMBULATORY_CARE_PROVIDER_SITE_OTHER): Payer: Self-pay

## 2019-02-09 ENCOUNTER — Other Ambulatory Visit: Payer: Self-pay

## 2019-02-09 ENCOUNTER — Ambulatory Visit (INDEPENDENT_AMBULATORY_CARE_PROVIDER_SITE_OTHER): Payer: Self-pay | Admitting: Orthopaedic Surgery

## 2019-02-09 DIAGNOSIS — S93432A Sprain of tibiofibular ligament of left ankle, initial encounter: Secondary | ICD-10-CM

## 2019-02-09 DIAGNOSIS — S8262XD Displaced fracture of lateral malleolus of left fibula, subsequent encounter for closed fracture with routine healing: Secondary | ICD-10-CM
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
## 2019-03-18 MED FILL — $COLCRYS 0.6 MG TABLET: 0.6 | 30 days supply | Qty: 30 | Fill #0

## 2019-03-18 MED FILL — ALLOPURINOL 300 MG TAB: 300 | 30 days supply | Qty: 30 | Fill #0

## 2019-03-18 MED FILL — POLYETHYLENE GLYCOL 3350 PO: 17 | 15 days supply | Qty: 238 | Fill #5

## 2019-03-18 MED FILL — AMLODIPINE BESYLATE 10 MG T: 10 | 30 days supply | Qty: 30 | Fill #0

## 2019-04-08 MED FILL — $COLCRYS 0.6 MG TABLET: 0.6 | 30 days supply | Qty: 30 | Fill #1

## 2019-04-28 ENCOUNTER — Other Ambulatory Visit: Payer: Self-pay | Admitting: Internal Medicine

## 2019-04-28 MED FILL — ALLOPURINOL 300 MG TAB: 300 | 30 days supply | Qty: 30 | Fill #1

## 2019-04-28 MED FILL — AMLODIPINE BESYLATE 10 MG T: 10 | 30 days supply | Qty: 30 | Fill #1

## 2019-04-29 MED FILL — POLYETHYLENE GLYCOL 3350 PO: 17 | 15 days supply | Qty: 238 | Fill #0

## 2019-05-09 MED FILL — $COLCRYS 0.6 MG TABLET: 0.6 | 30 days supply | Qty: 30 | Fill #2

## 2019-05-24 ENCOUNTER — Other Ambulatory Visit: Payer: Self-pay | Admitting: Internal Medicine

## 2019-05-24 MED FILL — ALLOPURINOL 300 MG TAB: 300 | 30 days supply | Qty: 30 | Fill #2

## 2019-05-24 MED FILL — AMLODIPINE BESYLATE 10 MG T: 10 | 30 days supply | Qty: 30 | Fill #2

## 2019-05-24 MED FILL — POLYETHYLENE GLYCOL 3350 PO: 17 | 14 days supply | Qty: 238 | Fill #0

## 2019-06-06 ENCOUNTER — Telehealth: Payer: Self-pay | Admitting: Internal Medicine

## 2019-06-06 MED FILL — $COLCRYS 0.6 MG TABLET: 0.6 | 30 days supply | Qty: 30 | Fill #3

## 2019-06-06 NOTE — Telephone Encounter (Signed)
1) Medication(s) Requested (by name): colchicine (COLCRYS) 0.6 MG tablet [[id number]]   2) Pharmacy of Choice:  [facility], [address]  [place], [place] [address]    Approved medications will be sent to pharmacy, we will reach out to you if there is an issue.  Requests made after 3pm may not be addressed until following business day!

## 2019-06-06 NOTE — Telephone Encounter (Signed)
Pt has refills on medication. I have asked Lurena Joiner to put in refill.

## 2019-06-24 ENCOUNTER — Other Ambulatory Visit: Payer: Self-pay | Admitting: Internal Medicine

## 2019-06-24 MED FILL — AMLODIPINE BESYLATE 10 MG T: 10 | 30 days supply | Qty: 30 | Fill #3

## 2019-06-24 MED FILL — ALLOPURINOL 300 MG TAB: 300 | 30 days supply | Qty: 30 | Fill #3

## 2019-06-27 MED FILL — POLYETHYLENE GLYCOL 3350 PO: 17 | 14 days supply | Qty: 238 | Fill #0

## 2019-06-28 MED FILL — $COLCRYS 0.6 MG TABLET: 0.6 | 30 days supply | Qty: 30 | Fill #4

## 2019-07-01 ENCOUNTER — Other Ambulatory Visit: Payer: Self-pay

## 2019-07-01 ENCOUNTER — Ambulatory Visit: Payer: Self-pay | Attending: Family | Admitting: Family

## 2019-07-01 DIAGNOSIS — M10061 Idiopathic gout, right knee: Secondary | ICD-10-CM

## 2019-07-01 HISTORY — DX: Idiopathic gout, right knee: M10.061

## 2019-07-01 MED ORDER — PREDNISONE 10 MG (21) PO TBPK: ORAL_TABLET | ORAL | 0 refills | Status: AC

## 2019-07-01 MED FILL — predniSONE 10 MG TABS: 10 | 8 days supply | Qty: 20 | Fill #0
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
## 2019-07-26 ENCOUNTER — Other Ambulatory Visit: Payer: Self-pay | Admitting: Internal Medicine

## 2019-07-26 MED FILL — $COLCRYS 0.6 MG TABLET: 0.6 | 30 days supply | Qty: 30 | Fill #5

## 2019-07-26 MED FILL — AMLODIPINE BESYLATE 10 MG T: 10 | 30 days supply | Qty: 30 | Fill #4

## 2019-07-26 MED FILL — ALLOPURINOL 300 MG TAB: 300 | 30 days supply | Qty: 30 | Fill #4

## 2019-07-27 MED FILL — POLYETHYLENE GLYCOL 3350 PO: 17 | 14 days supply | Qty: 238 | Fill #0

## 2019-08-26 MED FILL — ?ALLOPURINOL 300 MG TABLET: 300 | 30 days supply | Qty: 30 | Fill #5

## 2019-08-26 MED FILL — !COLCRYS 0.6 MG TABLET: 0.6 MG | 30 days supply | Qty: 30 | Fill #6

## 2019-08-26 MED FILL — POLYETHYLENE GLYCOL 3350 PO: 17 | 14 days supply | Qty: 238 | Fill #0

## 2019-08-29 MED FILL — AMLODIPINE BESYLATE 10 MG T: 10 | 30 days supply | Qty: 30 | Fill #5

## 2019-09-02 ENCOUNTER — Other Ambulatory Visit: Payer: Self-pay

## 2019-09-02 ENCOUNTER — Other Ambulatory Visit: Payer: Self-pay | Admitting: Family

## 2019-09-02 ENCOUNTER — Ambulatory Visit: Payer: Self-pay | Attending: Family | Admitting: Family

## 2019-09-02 ENCOUNTER — Encounter: Payer: Self-pay | Admitting: Family

## 2019-09-02 VITALS — BP 147/102 | HR 89 | Temp 98.1°F | Resp 16 | Wt 293.8 lb

## 2019-09-02 DIAGNOSIS — M1A0610 Idiopathic chronic gout, right knee, without tophus (tophi): Secondary | ICD-10-CM

## 2019-09-02 DIAGNOSIS — Z8739 Personal history of other diseases of the musculoskeletal system and connective tissue: Secondary | ICD-10-CM

## 2019-09-02 DIAGNOSIS — B354 Tinea corporis: Secondary | ICD-10-CM

## 2019-09-02 DIAGNOSIS — L739 Follicular disorder, unspecified: Secondary | ICD-10-CM

## 2019-09-02 DIAGNOSIS — L738 Other specified follicular disorders: Secondary | ICD-10-CM

## 2019-09-02 MED ORDER — COLCHICINE 0.6 MG PO TABS: 0.6000 mg | ORAL_TABLET | Freq: Every day | ORAL | 6 refills | Status: DC

## 2019-09-02 MED ORDER — ALLOPURINOL 300 MG PO TABS: 300.0000 mg | ORAL_TABLET | Freq: Every day | ORAL | 6 refills | Status: DC

## 2019-09-02 NOTE — Patient Instructions (Addendum)
Continue Allopurinol and Colchicine as prescribed. Follow-up with primary physician in 2 weeks for management of chronic conditions.  Referral to dermatology. Gout  Gout is painful swelling of your joints. Gout is a type of arthritis. It is caused by having too much uric acid in your body. Uric acid is a chemical that is made when your body breaks down substances called purines. If your body has too much uric acid, sharp crystals can form and build up in your joints. This causes pain and swelling. Gout attacks can happen quickly and be very painful (acute gout). Over time, the attacks can affect more joints and happen more often (chronic gout). What are the causes?  Too much uric acid in your blood. This can happen because: ? Your kidneys do not remove enough uric acid from your blood. ? Your body makes too much uric acid. ? You eat too many foods that are high in purines. These foods include organ meats, some seafood, and beer.  Trauma or stress. What increases the risk?  Having a family history of gout.  Being male and middle-aged.  Being male and having gone through menopause.  Being very overweight (obese).  Drinking alcohol, especially beer.  Not having enough water in the body (being dehydrated).  Losing weight too quickly.  Having an organ transplant.  Having lead poisoning.  Taking certain medicines.  Having kidney disease.  Having a skin condition called psoriasis. What are the signs or symptoms? An attack of acute gout usually happens in just one joint. The most common place is the big toe. Attacks often start at night. Other joints that may be affected include joints of the feet, ankle, knee, fingers, wrist, or elbow. Symptoms of an attack may include:  Very bad pain.  Warmth.  Swelling.  Stiffness.  Shiny, red, or purple skin.  Tenderness. The affected joint may be very painful to touch.  Chills and fever. Chronic gout may cause symptoms more often.  More joints may be involved. You may also have white or yellow lumps (tophi) on your hands or feet or in other areas near your joints. How is this treated?  Treatment for this condition has two phases: treating an acute attack and preventing future attacks.  Acute gout treatment may include: ? NSAIDs. ? Steroids. These are taken by mouth or injected into a joint. ? Colchicine. This medicine relieves pain and swelling. It can be given by mouth or through an IV tube.  Preventive treatment may include: ? Taking small doses of NSAIDs or colchicine daily. ? Using a medicine that reduces uric acid levels in your blood. ? Making changes to your diet. You may need to see a food expert (dietitian) about what to eat and drink to prevent gout. Follow these instructions at home: During a gout attack   If told, put ice on the painful area: ? Put ice in a plastic bag. ? Place a towel between your skin and the bag. ? Leave the ice on for 20 minutes, 2-3 times a day.  Raise (elevate) the painful joint above the level of your heart as often as you can.  Rest the joint as much as possible. If the joint is in your leg, you may be given crutches.  Follow instructions from your doctor about what you cannot eat or drink. Avoiding future gout attacks  Eat a low-purine diet. Avoid foods and drinks such as: ? Liver. ? Kidney. ? Anchovies. ? Asparagus. ? Herring. ? Mushrooms. ? Mussels. ?  Beer.  Stay at a healthy weight. If you want to lose weight, talk with your doctor. Do not lose weight too fast.  Start or continue an exercise plan as told by your doctor. Eating and drinking  Drink enough fluids to keep your pee (urine) pale yellow.  If you drink alcohol: ? Limit how much you use to:  0-1 drink a day for women.  0-2 drinks a day for men. ? Be aware of how much alcohol is in your drink. In the [place] one drink equals one 12 oz bottle of beer (355 mL), one 5 oz glass of wine (148 mL), or  one 1 oz glass of hard liquor (44 mL). General instructions  Take over-the-counter and prescription medicines only as told by your doctor.  Do not drive or use heavy machinery while taking prescription pain medicine.  Return to your normal activities as told by your doctor. Ask your doctor what activities are safe for you.  Keep all follow-up visits as told by your doctor. This is important. Contact a doctor if:  You have another gout attack.  You still have symptoms of a gout attack after 10 days of treatment.  You have problems (side effects) because of your medicines.  You have chills or a fever.  You have burning pain when you pee (urinate).  You have pain in your lower back or belly. Get help right away if:  You have very bad pain.  Your pain cannot be controlled.  You cannot pee. Summary  Gout is painful swelling of the joints.  The most common site of pain is the big toe, but it can affect other joints.  Medicines and avoiding some foods can help to prevent and treat gout attacks. This information is not intended to replace advice given to you by your health care provider. Make sure you discuss any questions you have with your health care provider. Document Revised: 12/16/2017 Document Reviewed: 12/16/2017 Elsevier Patient Education  2020 [employer].
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
## 2019-09-05 ENCOUNTER — Ambulatory Visit: Payer: Self-pay

## 2019-09-06 NOTE — Telephone Encounter (Signed)
disregard

## 2019-09-26 ENCOUNTER — Other Ambulatory Visit: Payer: Self-pay | Admitting: Internal Medicine

## 2019-09-26 MED FILL — !COLCRYS 0.6 MG TABLET: 0.6 MG | 15 days supply | Qty: 15 | Fill #0

## 2019-09-26 MED FILL — ALLOPURINOL 300 MG TAB: 300 | 30 days supply | Qty: 30 | Fill #6

## 2019-09-26 MED FILL — AMLODIPINE BESYLATE 10 MG T: 10 | 30 days supply | Qty: 30 | Fill #6

## 2019-09-28 MED FILL — POLYETHYLENE GLYCOL 3350 PO: 17 | 14 days supply | Qty: 238 | Fill #0

## 2019-10-07 ENCOUNTER — Ambulatory Visit: Payer: Self-pay

## 2019-10-07 ENCOUNTER — Other Ambulatory Visit: Payer: Self-pay

## 2019-10-13 MED FILL — !COLCRYS 0.6 MG TABLET: 0.6 MG | 30 days supply | Qty: 30 | Fill #1

## 2019-10-13 MED FILL — POLYETHYLENE GLYCOL 3350 PO: 17 | 14 days supply | Qty: 238 | Fill #0

## 2019-10-28 ENCOUNTER — Other Ambulatory Visit: Payer: Self-pay | Admitting: Internal Medicine

## 2019-10-28 DIAGNOSIS — I10 Essential (primary) hypertension: Secondary | ICD-10-CM

## 2019-10-28 MED FILL — AMLODIPINE BESYLATE 10 MG T: 10 | 30 days supply | Qty: 30 | Fill #0

## 2019-10-28 MED FILL — ALLOPURINOL 300 MG TAB: 300 | 30 days supply | Qty: 30 | Fill #0

## 2019-11-16 ENCOUNTER — Other Ambulatory Visit: Payer: Self-pay | Admitting: Internal Medicine

## 2019-11-24 ENCOUNTER — Other Ambulatory Visit: Payer: Self-pay | Admitting: Internal Medicine

## 2019-11-24 ENCOUNTER — Other Ambulatory Visit: Payer: Self-pay | Admitting: Family

## 2019-11-24 ENCOUNTER — Other Ambulatory Visit: Payer: Self-pay

## 2019-11-24 ENCOUNTER — Ambulatory Visit: Payer: Self-pay | Attending: Internal Medicine | Admitting: Family

## 2019-11-24 ENCOUNTER — Encounter: Payer: Self-pay | Admitting: Internal Medicine

## 2019-11-24 VITALS — BP 120/73 | HR 86 | Temp 97.3°F | Resp 16 | Wt 188.2 lb

## 2019-11-24 DIAGNOSIS — I10 Essential (primary) hypertension: Secondary | ICD-10-CM

## 2019-11-24 DIAGNOSIS — M109 Gout, unspecified: Secondary | ICD-10-CM

## 2019-11-24 DIAGNOSIS — L739 Follicular disorder, unspecified: Secondary | ICD-10-CM

## 2019-11-24 HISTORY — DX: Gout, unspecified: M10.9

## 2019-11-24 MED ORDER — AMLODIPINE BESYLATE 10 MG PO TABS: 10.0000 mg | ORAL_TABLET | Freq: Every day | ORAL | 0 refills | Status: DC

## 2019-11-24 MED ORDER — CYCLOBENZAPRINE HCL 5 MG PO TABS: 5.0000 mg | ORAL_TABLET | Freq: Every day | ORAL | 0 refills | Status: DC

## 2019-11-24 MED ORDER — CLINDAMYCIN PHOS-BENZOYL PEROX 1-5 % EX GEL: Freq: Two times a day (BID) | CUTANEOUS | 0 refills | Status: DC

## 2019-11-24 MED ORDER — ALLOPURINOL 300 MG PO TABS: 300.0000 mg | ORAL_TABLET | Freq: Every day | ORAL | 2 refills | Status: DC

## 2019-11-24 MED ORDER — COLCHICINE 0.6 MG PO TABS: 0.6000 mg | ORAL_TABLET | Freq: Every day | ORAL | 2 refills | Status: DC

## 2019-11-24 MED FILL — CLINDAMYCIN PHOS-BENZOYL PE: 1-5 | 25 days supply | Qty: 25 | Fill #0

## 2019-11-24 MED FILL — CYCLOBENZAPRINE 5 MG TABLET: 5 | 14 days supply | Qty: 14 | Fill #0

## 2019-11-24 MED FILL — AMLODIPINE BESYLATE 10 MG T: 10 | 30 days supply | Qty: 30 | Fill #0

## 2019-11-24 MED FILL — ALLOPURINOL 300 MG TAB: 300 | 30 days supply | Qty: 30 | Fill #1

## 2019-11-24 NOTE — Patient Instructions (Addendum)
Continue Amlodipine for blood pressure. Continue Allopurinol and Colchcine for gout. Lab today. Follow-up with primary physician in 3 months or sooner if needed. Hypertension, Adult Hypertension is another name for high blood pressure. High blood pressure forces your heart to work harder to pump blood. This can cause problems over time. There are two numbers in a blood pressure reading. There is a top number (systolic) over a bottom number (diastolic). It is best to have a blood pressure that is below 120/80. Healthy choices can help lower your blood pressure, or you may need medicine to help lower it. What are the causes? The cause of this condition is not known. Some conditions may be related to high blood pressure. What increases the risk?  Smoking.  Having type 2 diabetes mellitus, high cholesterol, or both.  Not getting enough exercise or physical activity.  Being overweight.  Having too much fat, sugar, calories, or salt (sodium) in your diet.  Drinking too much alcohol.  Having long-term (chronic) kidney disease.  Having a family history of high blood pressure.  Age. Risk increases with age.  Race. You may be at higher risk if you are African American.  Gender. Men are at higher risk than women before age 24. After age 36, women are at higher risk than men.  Having obstructive sleep apnea.  Stress. What are the signs or symptoms?  High blood pressure may not cause symptoms. Very high blood pressure (hypertensive crisis) may cause: ? Headache. ? Feelings of worry or nervousness (anxiety). ? Shortness of breath. ? Nosebleed. ? A feeling of being sick to your stomach (nausea). ? Throwing up (vomiting). ? Changes in how you see. ? Very bad chest pain. ? Seizures. How is this treated?  This condition is treated by making healthy lifestyle changes, such as: ? Eating healthy foods. ? Exercising more. ? Drinking less alcohol.  Your health care provider may prescribe  medicine if lifestyle changes are not enough to get your blood pressure under control, and if: ? Your top number is above 130. ? Your bottom number is above 80.  Your personal target blood pressure may vary. Follow these instructions at home: Eating and drinking   If told, follow the DASH eating plan. To follow this plan: ? Fill one half of your plate at each meal with fruits and vegetables. ? Fill one fourth of your plate at each meal with whole grains. Whole grains include whole-wheat pasta, brown rice, and whole-grain bread. ? Eat or drink low-fat dairy products, such as skim milk or low-fat yogurt. ? Fill one fourth of your plate at each meal with low-fat (lean) proteins. Low-fat proteins include fish, chicken without skin, eggs, beans, and tofu. ? Avoid fatty meat, cured and processed meat, or chicken with skin. ? Avoid pre-made or processed food.  Eat less than 1,500 mg of salt each day.  Do not drink alcohol if: ? Your doctor tells you not to drink. ? You are pregnant, may be pregnant, or are planning to become pregnant.  If you drink alcohol: ? Limit how much you use to:  0-1 drink a day for women.  0-2 drinks a day for men. ? Be aware of how much alcohol is in your drink. In the [place] one drink equals one 12 oz bottle of beer (355 mL), one 5 oz glass of wine (148 mL), or one 1 oz glass of hard liquor (44 mL). Lifestyle   Work with your doctor to stay at a healthy  weight or to lose weight. Ask your doctor what the best weight is for you.  Get at least 30 minutes of exercise most days of the week. This may include walking, swimming, or biking.  Get at least 30 minutes of exercise that strengthens your muscles (resistance exercise) at least 3 days a week. This may include lifting weights or doing Pilates.  Do not use any products that contain nicotine or tobacco, such as cigarettes, e-cigarettes, and chewing tobacco. If you need help quitting, ask your doctor.  Check  your blood pressure at home as told by your doctor.  Keep all follow-up visits as told by your doctor. This is important. Medicines  Take over-the-counter and prescription medicines only as told by your doctor. Follow directions carefully.  Do not skip doses of blood pressure medicine. The medicine does not work as well if you skip doses. Skipping doses also puts you at risk for problems.  Ask your doctor about side effects or reactions to medicines that you should watch for. Contact a doctor if you:  Think you are having a reaction to the medicine you are taking.  Have headaches that keep coming back (recurring).  Feel dizzy.  Have swelling in your ankles.  Have trouble with your vision. Get help right away if you:  Get a very bad headache.  Start to feel mixed up (confused).  Feel weak or numb.  Feel faint.  Have very bad pain in your: ? Chest. ? Belly (abdomen).  Throw up more than once.  Have trouble breathing. Summary  Hypertension is another name for high blood pressure.  High blood pressure forces your heart to work harder to pump blood.  For most people, a normal blood pressure is less than 120/80.  Making healthy choices can help lower blood pressure. If your blood pressure does not get lower with healthy choices, you may need to take medicine. This information is not intended to replace advice given to you by your health care provider. Make sure you discuss any questions you have with your health care provider. Document Revised: 02/03/2018 Document Reviewed: 02/03/2018 Elsevier Patient Education  2020 [employer].
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
## 2019-11-25 LAB — CMP14+EGFR
ALT: 30 IU/L (ref 0–44)
AST: 29 IU/L (ref 0–40)
Albumin/Globulin Ratio: 1.6 (ref 1.2–2.2)
Albumin: 4.3 g/dL (ref 4.0–5.0)
Alkaline Phosphatase: 68 IU/L (ref 48–121)
BUN/Creatinine Ratio: 13 (ref 9–20)
BUN: 13 mg/dL (ref 6–24)
Bilirubin Total: 0.4 mg/dL (ref 0.0–1.2)
CO2: 25 mmol/L (ref 20–29)
Calcium: 9.9 mg/dL (ref 8.7–10.2)
Chloride: 106 mmol/L (ref 96–106)
Creatinine, Ser: 0.97 mg/dL (ref 0.76–1.27)
GFR calc Af Amer: 109 mL/min/{1.73_m2} (ref >59)
GFR calc non Af Amer: 94 mL/min/{1.73_m2} (ref >59)
Globulin, Total: 2.7 g/dL (ref 1.5–4.5)
Glucose: 90 mg/dL (ref 65–99)
Potassium: 4.5 mmol/L (ref 3.5–5.2)
Sodium: 145 mmol/L — ABNORMAL HIGH (ref 134–144)
Total Protein: 7 g/dL (ref 6.0–8.5)

## 2019-11-25 LAB — CBC WITH DIFFERENTIAL
Basophils Absolute: 0 10*3/uL (ref 0.0–0.2)
Basos: 1 %
EOS (ABSOLUTE): 0.4 10*3/uL (ref 0.0–0.4)
Eos: 10 %
Hematocrit: 41.1 % (ref 37.5–51.0)
Hemoglobin: 13.6 g/dL (ref 13.0–17.7)
Immature Grans (Abs): 0 10*3/uL (ref 0.0–0.1)
Immature Granulocytes: 0 %
Lymphocytes Absolute: 1.3 10*3/uL (ref 0.7–3.1)
Lymphs: 38 %
MCH: 30.9 pg (ref 26.6–33.0)
MCHC: 33.1 g/dL (ref 31.5–35.7)
MCV: 93 fL (ref 79–97)
Monocytes Absolute: 0.5 10*3/uL (ref 0.1–0.9)
Monocytes: 14 %
Neutrophils Absolute: 1.3 10*3/uL — ABNORMAL LOW (ref 1.4–7.0)
Neutrophils: 37 %
RBC: 4.4 x10E6/uL (ref 4.14–5.80)
RDW: 11.5 % — ABNORMAL LOW (ref 11.6–15.4)
WBC: 3.4 10*3/uL (ref 3.4–10.8)

## 2019-11-26 NOTE — Progress Notes (Signed)
Please call patient with update.   Kidney function normal.   Liver function normal.   No anemia.

## 2019-11-28 ENCOUNTER — Telehealth: Payer: Self-pay

## 2019-11-28 NOTE — Telephone Encounter (Signed)
-----   Message from Rema Fendt, NP sent at 11/26/2019  8:24 AM EDT ----- Please call patient with update.   Kidney function normal.   Liver function normal.   No anemia.

## 2019-11-28 NOTE — Telephone Encounter (Signed)
Contacted pt to go over lab results pt didn't answer left a detailed vm informing pt of results and if he has any questions or concerns to give a call  °

## 2019-11-29 MED FILL — !COLCRYS 0.6 MG TABLET: 0.6 MG | 15 days supply | Qty: 15 | Fill #3

## 2019-12-15 ENCOUNTER — Other Ambulatory Visit: Payer: Self-pay | Admitting: Internal Medicine

## 2019-12-15 MED FILL — !COLCRYS 0.6 MG TABLET: 0.6 MG | 15 days supply | Qty: 15 | Fill #4

## 2019-12-15 NOTE — Telephone Encounter (Signed)
Approved per protocol.  Requested Prescriptions  Pending Prescriptions Disp Refills  . polyethylene glycol powder (GLYCOLAX/MIRALAX) 17 GM/SCOOP powder [Pharmacy Med Name: POLYETHYLENE GLYCOL 3350 PO 17 Powder] 238 g 0    Sig: MIX 17 GRAMS IN LIQUID AND DRINK DAILY AS NEEDED FOR CONSTIPATION     Gastroenterology:  Laxatives Passed - 12/15/2019  2:56 PM      Passed - Valid encounter within last 12 months    Recent Outpatient Visits          3 weeks ago Essential hypertension   [facility] [place], [place] J, NP   3 months ago Chronic gout of right knee, unspecified cause   [facility] [place], [place], NP   5 months ago Gout flare   [facility] [place], [place], NP   10 months ago Essential hypertension   [facility] Marcine Matar, MD   1 year ago Essential hypertension   [facility] Marcine Matar, MD      Future Appointments            In 3 months Laural Benes, Binnie Rail, MD [facility]

## 2019-12-16 MED FILL — POLYETHYLENE GLYCOL 3350 PO: 17 | 14 days supply | Qty: 238 | Fill #0

## 2019-12-28 ENCOUNTER — Other Ambulatory Visit: Payer: Self-pay | Admitting: Internal Medicine

## 2019-12-28 MED FILL — ?ALLOPURINOL 300 MG TABLET: 300 | 30 days supply | Qty: 30 | Fill #0

## 2019-12-28 MED FILL — POLYETHYLENE GLYCOL 3350 PO: 17 | 14 days supply | Qty: 238 | Fill #0

## 2019-12-28 NOTE — Telephone Encounter (Signed)
Requested Prescriptions  Pending Prescriptions Disp Refills   polyethylene glycol powder (GLYCOLAX/MIRALAX) 17 GM/SCOOP powder [Pharmacy Med Name: POLYETHYLENE GLYCOL 3350 PO 17 Powder] 238 g 1    Sig: MIX 17 GRAMS IN LIQUID & DRINK DAILY AS NEEDED FOR CONSTIPATION     Gastroenterology:  Laxatives Passed - 12/28/2019  4:15 PM      Passed - Valid encounter within last 12 months    Recent Outpatient Visits          1 month ago Essential hypertension   [facility] [place], [place] J, NP   3 months ago Chronic gout of right knee, unspecified cause   [facility] [place], [place], NP   6 months ago Gout flare   [facility] [place], [place], NP   10 months ago Essential hypertension   [facility] Marcine Matar, MD   1 year ago Essential hypertension   [facility] Marcine Matar, MD      Future Appointments            In 2 months Laural Benes Binnie Rail, MD [facility]

## 2019-12-29 MED FILL — ?AMLODIPINE BESYL 10MG TABL: 10 | 30 days supply | Qty: 30 | Fill #1

## 2019-12-29 MED FILL — ?COLCHINCINE 0.6 MG TABS: 0.6 | 15 days supply | Qty: 15 | Fill #5

## 2020-01-19 ENCOUNTER — Other Ambulatory Visit: Payer: Self-pay

## 2020-01-19 ENCOUNTER — Emergency Department (HOSPITAL_COMMUNITY): Payer: HRSA Program

## 2020-01-19 ENCOUNTER — Inpatient Hospital Stay (HOSPITAL_COMMUNITY)
Admission: EM | Admit: 2020-01-19 | Discharge: 2020-01-23 | DRG: 177 | Disposition: A | Discharge status: Home / Self Care | Payer: HRSA Program | Attending: Family Medicine | Admitting: Family Medicine

## 2020-01-19 ENCOUNTER — Ambulatory Visit: Payer: Self-pay | Admitting: *Deleted

## 2020-01-19 ENCOUNTER — Encounter (HOSPITAL_COMMUNITY): Payer: Self-pay | Admitting: Obstetrics and Gynecology

## 2020-01-19 DIAGNOSIS — J9601 Acute respiratory failure with hypoxia: Secondary | ICD-10-CM | POA: Diagnosis present

## 2020-01-19 DIAGNOSIS — K219 Gastro-esophageal reflux disease without esophagitis: Secondary | ICD-10-CM | POA: Diagnosis present

## 2020-01-19 DIAGNOSIS — U071 COVID-19: Secondary | ICD-10-CM | POA: Diagnosis present

## 2020-01-19 DIAGNOSIS — A4189 Other specified sepsis: Secondary | ICD-10-CM | POA: Diagnosis present

## 2020-01-19 DIAGNOSIS — Z881 Allergy status to other antibiotic agents status: Secondary | ICD-10-CM

## 2020-01-19 DIAGNOSIS — M109 Gout, unspecified: Secondary | ICD-10-CM | POA: Diagnosis present

## 2020-01-19 DIAGNOSIS — I10 Essential (primary) hypertension: Secondary | ICD-10-CM | POA: Diagnosis present

## 2020-01-19 DIAGNOSIS — Z8614 Personal history of Methicillin resistant Staphylococcus aureus infection: Secondary | ICD-10-CM | POA: Diagnosis not present

## 2020-01-19 DIAGNOSIS — Z882 Allergy status to sulfonamides status: Secondary | ICD-10-CM

## 2020-01-19 DIAGNOSIS — Z79899 Other long term (current) drug therapy: Secondary | ICD-10-CM

## 2020-01-19 DIAGNOSIS — J1282 Pneumonia due to coronavirus disease 2019: Secondary | ICD-10-CM | POA: Diagnosis present

## 2020-01-19 DIAGNOSIS — Z86010 Personal history of colonic polyps: Secondary | ICD-10-CM

## 2020-01-19 DIAGNOSIS — M1990 Unspecified osteoarthritis, unspecified site: Secondary | ICD-10-CM | POA: Diagnosis present

## 2020-01-19 HISTORY — DX: Pneumonia due to coronavirus disease 2019: J12.82

## 2020-01-19 HISTORY — DX: COVID-19: U07.1

## 2020-01-19 LAB — COMPREHENSIVE METABOLIC PANEL
ALT: 30 U/L (ref 0–44)
AST: 43 U/L — ABNORMAL HIGH (ref 15–41)
Albumin: 4.1 g/dL (ref 3.5–5.0)
Alkaline Phosphatase: 39 U/L (ref 38–126)
Anion gap: 10 (ref 5–15)
BUN: 13 mg/dL (ref 6–20)
CO2: 24 mmol/L (ref 22–32)
Calcium: 8.7 mg/dL — ABNORMAL LOW (ref 8.9–10.3)
Chloride: 99 mmol/L (ref 98–111)
Creatinine, Ser: 0.98 mg/dL (ref 0.61–1.24)
GFR calc Af Amer: >60 mL/min (ref >60)
GFR calc non Af Amer: >60 mL/min (ref >60)
Glucose, Bld: 103 mg/dL — ABNORMAL HIGH (ref 70–99)
Potassium: 3.8 mmol/L (ref 3.5–5.1)
Sodium: 133 mmol/L — ABNORMAL LOW (ref 135–145)
Total Bilirubin: 1.3 mg/dL — ABNORMAL HIGH (ref 0.3–1.2)
Total Protein: 8.2 g/dL — ABNORMAL HIGH (ref 6.5–8.1)

## 2020-01-19 LAB — CBC WITH DIFFERENTIAL/PLATELET
Abs Immature Granulocytes: 0 10*3/uL (ref 0.00–0.07)
Basophils Absolute: 0 10*3/uL (ref 0.0–0.1)
Basophils Relative: 0 %
Eosinophils Absolute: 0 10*3/uL (ref 0.0–0.5)
Eosinophils Relative: 0 %
HCT: 41.8 % (ref 39.0–52.0)
Hemoglobin: 13.7 g/dL (ref 13.0–17.0)
Immature Granulocytes: 0 %
Lymphocytes Relative: 24 %
Lymphs Abs: 1.1 10*3/uL (ref 0.7–4.0)
MCH: 30.5 pg (ref 26.0–34.0)
MCHC: 32.8 g/dL (ref 30.0–36.0)
MCV: 93.1 fL (ref 80.0–100.0)
Monocytes Absolute: 0.3 10*3/uL (ref 0.1–1.0)
Monocytes Relative: 6 %
Neutro Abs: 3.1 10*3/uL (ref 1.7–7.7)
Neutrophils Relative %: 70 %
Platelets: 141 10*3/uL — ABNORMAL LOW (ref 150–400)
RBC: 4.49 MIL/uL (ref 4.22–5.81)
RDW: 11.4 % — ABNORMAL LOW (ref 11.5–15.5)
WBC: 4.5 10*3/uL (ref 4.0–10.5)
nRBC: 0 % (ref 0.0–0.2)

## 2020-01-19 LAB — PROCALCITONIN: Procalcitonin: <0.1 ng/mL

## 2020-01-19 LAB — LACTATE DEHYDROGENASE: LDH: 221 U/L — ABNORMAL HIGH (ref 98–192)

## 2020-01-19 LAB — TRIGLYCERIDES: Triglycerides: 118 mg/dL (ref <150)

## 2020-01-19 LAB — FIBRINOGEN: Fibrinogen: 619 mg/dL — ABNORMAL HIGH (ref 210–475)

## 2020-01-19 LAB — D-DIMER, QUANTITATIVE: D-Dimer, Quant: 0.5 ug/mL-FEU (ref 0.00–0.50)

## 2020-01-19 LAB — LACTIC ACID, PLASMA: Lactic Acid, Venous: 0.8 mmol/L (ref 0.5–1.9)

## 2020-01-19 LAB — C-REACTIVE PROTEIN: CRP: 12.9 mg/dL — ABNORMAL HIGH (ref <1.0)

## 2020-01-19 LAB — FERRITIN: Ferritin: 554 ng/mL — ABNORMAL HIGH (ref 24–336)

## 2020-01-19 MED ORDER — DEXAMETHASONE SODIUM PHOSPHATE 10 MG/ML IJ SOLN
10.0000 mg | Freq: Once | INTRAMUSCULAR | Status: AC
  Administered 2020-01-20: 10 mg via INTRAVENOUS
  Filled 2020-01-19: qty 1

## 2020-01-19 MED ORDER — SODIUM CHLORIDE 0.9 % IV SOLN
100.0000 mg | INTRAVENOUS | Status: AC
  Administered 2020-01-20 (×2): 100 mg via INTRAVENOUS

## 2020-01-19 MED ORDER — SODIUM CHLORIDE 0.9 % IV SOLN
100.0000 mg | Freq: Every day | INTRAVENOUS | Status: AC
  Administered 2020-01-20 – 2020-01-23 (×4): 100 mg via INTRAVENOUS
  Filled 2020-01-19 (×8): qty 20

## 2020-01-19 MED ORDER — ACETAMINOPHEN 325 MG PO TABS
650.0000 mg | ORAL_TABLET | Freq: Once | ORAL | Status: AC
  Administered 2020-01-20: 650 mg via ORAL
  Filled 2020-01-19: qty 2

## 2020-01-19 NOTE — Telephone Encounter (Signed)
Will forward to pcp
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
## 2020-01-19 NOTE — Telephone Encounter (Signed)
C/o worsening symptoms of Covid. Diagnosed today and patient is having worsening difficulty breathing at rest and walking to bathroom. C/o chills, cough, headache, back ache and wheezing. Reports coughing up green colored mucus. Hx recent surgery and obesity. Instructed patient to go to ED due to difficulty breathing is worsening today and co morbidities. Patient verbalized understanding of care advise.   Reason for Disposition . MILD difficulty breathing (e.g., minimal/no SOB at rest, SOB with walking, pulse <100)  Answer Assessment - Initial Assessment Questions 1. COVID-19 DIAGNOSIS: "Who made your Coronavirus (COVID-19) diagnosis?" "Was it confirmed by a positive lab test?" If not diagnosed by a HCP, ask "Are there lots of cases (community spread) where you live?" (See public health department website, if unsure)     [facility].  2. COVID-19 EXPOSURE: "Was there any known exposure to COVID before the symptoms began?" CDC Definition of close contact: within 6 feet (2 meters) for a total of 15 minutes or more over a 24-hour period.      na 3. ONSET: "When did the COVID-19 symptoms start?"      4 days ago 4. WORST SYMPTOM: "What is your worst symptom?" (e.g., cough, fever, shortness of breath, muscle aches)    Difficulty breathing and cough 5. COUGH: "Do you have a cough?" If Yes, ask: "How bad is the cough?"       Yes. Getting worse head hurts with cough and productive cough, green mucus 6. FEVER: "Do you have a fever?" If Yes, ask: "What is your temperature, how was it measured, and when did it start?"     Not sure but does have chills 7. RESPIRATORY STATUS: "Describe your breathing?" (e.g., shortness of breath, wheezing, unable to speak)      Wheezing, difficulty breathing and gives out when walking 8. BETTER-SAME-WORSE: "Are you getting better, staying the same or getting worse compared to yesterday?"  If getting worse, ask, "In what way?"     Getting worse 9. HIGH RISK DISEASE: "Do you have any  chronic medical problems?" (e.g., asthma, heart or lung disease, weak immune system, obesity, etc.)     Recent surgery, obesity  11. OTHER SYMPTOMS: "Do you have any other symptoms?"  (e.g., chills, fatigue, headache, loss of smell or taste, muscle pain, sore throat; new loss of smell or taste especially support the diagnosis of COVID-19)       Chills, headache, back ache, difficulty breathing  Protocols used: CORONAVIRUS (COVID-19) DIAGNOSED OR SUSPECTED-A-AH

## 2020-01-19 NOTE — ED Triage Notes (Signed)
Patient reports to the ER for COVID positive. Patient reports he tested positive today. Patient reports SOB, headaches.
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
## 2020-01-20 DIAGNOSIS — U071 COVID-19: Principal | ICD-10-CM

## 2020-01-20 LAB — CBC WITH DIFFERENTIAL/PLATELET
Abs Immature Granulocytes: 0.01 10*3/uL (ref 0.00–0.07)
Basophils Absolute: 0 10*3/uL (ref 0.0–0.1)
Basophils Relative: 0 %
Eosinophils Absolute: 0 10*3/uL (ref 0.0–0.5)
Eosinophils Relative: 0 %
HCT: 37.5 % — ABNORMAL LOW (ref 39.0–52.0)
Hemoglobin: 12.3 g/dL — ABNORMAL LOW (ref 13.0–17.0)
Immature Granulocytes: 0 %
Lymphocytes Relative: 21 %
Lymphs Abs: 0.9 10*3/uL (ref 0.7–4.0)
MCH: 30.6 pg (ref 26.0–34.0)
MCHC: 32.8 g/dL (ref 30.0–36.0)
MCV: 93.3 fL (ref 80.0–100.0)
Monocytes Absolute: 0.2 10*3/uL (ref 0.1–1.0)
Monocytes Relative: 4 %
Neutro Abs: 3.1 10*3/uL (ref 1.7–7.7)
Neutrophils Relative %: 75 %
Platelets: 131 10*3/uL — ABNORMAL LOW (ref 150–400)
RBC: 4.02 MIL/uL — ABNORMAL LOW (ref 4.22–5.81)
RDW: 11.4 % — ABNORMAL LOW (ref 11.5–15.5)
WBC: 4.2 10*3/uL (ref 4.0–10.5)
nRBC: 0 % (ref 0.0–0.2)

## 2020-01-20 LAB — COMPREHENSIVE METABOLIC PANEL WITH GFR
ALT: 25 U/L (ref 0–44)
AST: 39 U/L (ref 15–41)
Albumin: 3.7 g/dL (ref 3.5–5.0)
Alkaline Phosphatase: 36 U/L — ABNORMAL LOW (ref 38–126)
Anion gap: 11 (ref 5–15)
BUN: 16 mg/dL (ref 6–20)
CO2: 25 mmol/L (ref 22–32)
Calcium: 8.6 mg/dL — ABNORMAL LOW (ref 8.9–10.3)
Chloride: 100 mmol/L (ref 98–111)
Creatinine, Ser: 0.99 mg/dL (ref 0.61–1.24)
GFR calc Af Amer: >60 mL/min
GFR calc non Af Amer: >60 mL/min
Glucose, Bld: 108 mg/dL — ABNORMAL HIGH (ref 70–99)
Potassium: 3.8 mmol/L (ref 3.5–5.1)
Sodium: 136 mmol/L (ref 135–145)
Total Bilirubin: 1.5 mg/dL — ABNORMAL HIGH (ref 0.3–1.2)
Total Protein: 7.2 g/dL (ref 6.5–8.1)

## 2020-01-20 LAB — MAGNESIUM: Magnesium: 2.5 mg/dL — ABNORMAL HIGH (ref 1.7–2.4)

## 2020-01-20 LAB — SARS CORONAVIRUS 2 BY RT PCR (HOSPITAL ORDER, PERFORMED IN ~~LOC~~ HOSPITAL LAB): SARS Coronavirus 2: POSITIVE — AB

## 2020-01-20 LAB — C-REACTIVE PROTEIN: CRP: 12.5 mg/dL — ABNORMAL HIGH (ref <1.0)

## 2020-01-20 LAB — D-DIMER, QUANTITATIVE: D-Dimer, Quant: 0.49 ug{FEU}/mL (ref 0.00–0.50)

## 2020-01-20 LAB — HIV ANTIBODY (ROUTINE TESTING W REFLEX): HIV Screen 4th Generation wRfx: NONREACTIVE

## 2020-01-20 LAB — FERRITIN: Ferritin: 651 ng/mL — ABNORMAL HIGH (ref 24–336)

## 2020-01-20 LAB — LACTIC ACID, PLASMA: Lactic Acid, Venous: 0.7 mmol/L (ref 0.5–1.9)

## 2020-01-20 MED ORDER — TRAMADOL HCL 50 MG PO TABS: 50.0000 mg | ORAL_TABLET | Freq: Four times a day (QID) | ORAL | Status: DC | PRN

## 2020-01-20 MED ORDER — ALBUTEROL SULFATE HFA 108 (90 BASE) MCG/ACT IN AERS
2.0000 | INHALATION_SPRAY | Freq: Four times a day (QID) | RESPIRATORY_TRACT | Status: DC
  Administered 2020-01-20 – 2020-01-23 (×14): 2 via RESPIRATORY_TRACT
  Filled 2020-01-20 (×5): qty 6.7

## 2020-01-20 MED ORDER — HYDROCOD POLST-CPM POLST ER 10-8 MG/5ML PO SUER: 5.0000 mL | Freq: Two times a day (BID) | ORAL | Status: DC | PRN

## 2020-01-20 MED ORDER — SODIUM CHLORIDE 0.9 % IV SOLN: INTRAVENOUS | Status: DC

## 2020-01-20 MED ORDER — ALLOPURINOL 300 MG PO TABS
300.0000 mg | ORAL_TABLET | Freq: Every day | ORAL | Status: DC
  Administered 2020-01-20 – 2020-01-23 (×4): 300 mg via ORAL
  Filled 2020-01-20 (×5): qty 1

## 2020-01-20 MED ORDER — HEPARIN SODIUM (PORCINE) 5000 UNIT/ML IJ SOLN
5000.0000 [IU] | Freq: Three times a day (TID) | INTRAMUSCULAR | Status: DC
  Administered 2020-01-20 – 2020-01-23 (×10): 5000 [IU] via SUBCUTANEOUS
  Filled 2020-01-20 (×10): qty 1

## 2020-01-20 MED ORDER — POLYETHYLENE GLYCOL 3350 17 G PO PACK: 17.0000 g | PACK | Freq: Every day | ORAL | Status: DC | PRN

## 2020-01-20 MED ORDER — ASPIRIN EC 81 MG PO TBEC
81.0000 mg | DELAYED_RELEASE_TABLET | Freq: Two times a day (BID) | ORAL | Status: DC
  Administered 2020-01-20 – 2020-01-23 (×7): 81 mg via ORAL
  Filled 2020-01-20 (×7): qty 1

## 2020-01-20 MED ORDER — AMLODIPINE BESYLATE 10 MG PO TABS
10.0000 mg | ORAL_TABLET | Freq: Every day | ORAL | Status: DC
  Administered 2020-01-20 – 2020-01-23 (×4): 10 mg via ORAL
  Filled 2020-01-20 (×2): qty 2
  Filled 2020-01-20: qty 1
  Filled 2020-01-20: qty 2

## 2020-01-20 MED ORDER — DEXAMETHASONE SODIUM PHOSPHATE 10 MG/ML IJ SOLN
6.0000 mg | INTRAMUSCULAR | Status: DC
  Administered 2020-01-20 – 2020-01-23 (×4): 6 mg via INTRAVENOUS
  Filled 2020-01-20 (×4): qty 1

## 2020-01-20 MED ORDER — ONDANSETRON HCL 4 MG/2ML IJ SOLN: 4.0000 mg | Freq: Four times a day (QID) | INTRAMUSCULAR | Status: DC | PRN

## 2020-01-20 MED ORDER — ONDANSETRON HCL 4 MG PO TABS: 4.0000 mg | ORAL_TABLET | Freq: Four times a day (QID) | ORAL | Status: DC | PRN

## 2020-01-20 MED ORDER — COLCHICINE 0.6 MG PO TABS
0.6000 mg | ORAL_TABLET | Freq: Every day | ORAL | Status: DC
  Administered 2020-01-20 – 2020-01-23 (×4): 0.6 mg via ORAL
  Filled 2020-01-20 (×4): qty 1

## 2020-01-20 MED ORDER — GUAIFENESIN-DM 100-10 MG/5ML PO SYRP: 10.0000 mL | ORAL_SOLUTION | ORAL | Status: DC | PRN

## 2020-01-20 MED ORDER — ADULT MULTIVITAMIN W/MINERALS CH
1.0000 | ORAL_TABLET | Freq: Every day | ORAL | Status: DC
  Administered 2020-01-20 – 2020-01-23 (×4): 1 via ORAL
  Filled 2020-01-20 (×4): qty 1

## 2020-01-20 MED ORDER — ACETAMINOPHEN 325 MG PO TABS: 650.0000 mg | ORAL_TABLET | Freq: Four times a day (QID) | ORAL | Status: DC | PRN

## 2020-01-20 NOTE — ED Notes (Signed)
Pt provided lunch tray.

## 2020-01-20 NOTE — ED Notes (Signed)
Pt provided with hygiene supplies, setup at sink.  Call bell within reach.

## 2020-01-20 NOTE — ED Notes (Signed)
Pt O2 decreased to 1L/min.  Pt O2 saturations remain 94-96%.  Will continue to monitor.

## 2020-01-20 NOTE — Progress Notes (Addendum)
PROGRESS NOTE    Wayne [name]  ZOX:[id number] DOB: [birth year] DOA: 01/19/2020 PCP: Marcine Matar, MD   Chief Complaint  Patient presents with   COVID Positive   Brief Narrative:   Wayne [name]  is Wayne [name] 44 y.o. male, with history of obesity, GERD, gout, B12 deficiency, anemia, hypertension presents to the ER with Wayne [name] chief complaint of dyspnea.  Patient reports that he has had shortness of breath that is gradually been getting worse over the past several days.  It started 5 days ago, but used to be worse at night and better during the day.  It started becoming more constant, and then today was constant throughout the day.  It is worse with exertion, better with rest.  He has associated cold sweats, chills, chest pain, productive cough with green sputum, intense headache, lightheadedness, fatigue, and body aches.  Patient has taken Motrin with little to no relief of the symptoms.  He reports that he also has nausea, decreased appetite, and has not eaten anything in the last 3 days.  He reports that he has only been drinking about half Wayne [name] bottle of water per day.  Patient does not smoke or have any known lung disease.  Patient reports that on day 3 of his symptoms he decided to go get vaccinated for Covid.  His symptoms started while he was at [facility].  Patient has no other complaints at this time  In the ED Temperature 103, pulse 107, blood pressure 144/96, respirate is 16, saturating at 93% CBC and CHEM panel were unremarkable D-dimer is 0.50, fibrinogen is 619, LDH is 221, triglycerides 118, ferritin 554, CRP 12.9, lactic acid 0.8, pro-Cal less than 0.10 X-ray shows patchy bilateral groundglass opacities and consolidations consistent with multifocal pneumonia Remdesivir and Decadron were started  Assessment & Plan:   Active Problems:   COVID-19  1. Acute hypoxic respiratory failure 2/2 COVID 19 Pneumonia 1. Unvaccinated (received dose #1 after symptoms began) 2. CXR with multifocal  pneumonia 3. Currently requiring 1 L by [place] 4. Continue steroids and dexamethasone 5. If worsens, consider transition to solumedrol.  Consider adding baricitinib (discussed risks/benefits - infection/malignancy/vte - he's agreeable if necessary).  At this point, hopefully he won't need this escalation in therapy. 6. Elevated CRP, ferritin.  D dimer wnl. 7. I/O, daily weights 8. Prone as able, OOB, IS, flutter.  COVID-19 Labs  Recent Labs    01/19/20 2050 01/20/20 0608  DDIMER 0.50 0.49  FERRITIN 554* 651*  LDH 221*  --   CRP 12.9* 12.5*    Lab Results  Component Value Date   SARSCOV2NAA POSITIVE (Wayne [name]) 01/20/2020   2. HTN 1. Continue amlodipine  3. Gout: continue home colchicine and allopurinol  4. Thrombocytopenia: mild, likely 2/2 covid  5. Tachycardia 1. Improved   DVT prophylaxis: heparin Code Status: full  Family Communication: none at bedside Disposition:   Status is: Inpatient  Remains inpatient appropriate because:Inpatient level of care appropriate due to severity of illness   Dispo: The patient is from: Home              Anticipated d/c is to: Home              Anticipated d/c date is: 2 days              Patient currently is not medically stable to d/c.   Consultants:   None  Procedures:   none  Antimicrobials:  Anti-infectives (From admission, onward)   Start  Dose/Rate Route Frequency Ordered Stop   01/20/20 1000  remdesivir 100 mg in sodium chloride 0.9 % 100 mL IVPB     Discontinue    "Followed by" Linked Group Details   100 mg 200 mL/hr over 30 Minutes Intravenous Daily 01/19/20 2250 01/24/20 0959   01/19/20 2300  remdesivir 100 mg in sodium chloride 0.9 % 100 mL IVPB       "Followed by" Linked Group Details   100 mg 200 mL/hr over 30 Minutes Intravenous Every 30 min 01/19/20 2250 01/20/20 0602     Subjective: C/o SOB, no new complaints  Objective: Vitals:   01/20/20 0939 01/20/20 1151 01/20/20 1157 01/20/20 1454  BP:  (!)  142/87  (!) 139/100  Pulse: 87 86 83 86  Resp:  17 (!) 24 (!) 23  Temp:      TempSrc:      SpO2: 96% 93% 94% 93%    Intake/Output Summary (Last 24 hours) at 01/20/2020 1616 Last data filed at 01/20/2020 1404 Gross per 24 hour  Intake 1037.2 ml  Output 650 ml  Net 387.2 ml   There were no vitals filed for this visit.  Examination:  General exam: Appears calm and comfortable.  Sitting up eating lunch. Respiratory system: unlabored Cardiovascular system: S1 & S2 heard, RRR. Gastrointestinal system: Abdomen is nondistended, soft and nontender.  Central nervous system: Alert and oriented. No focal neurological deficits. Extremities: no LEE Skin: No rashes, lesions or ulcers Psychiatry: Judgement and insight appear normal. Mood & affect appropriate.     Data Reviewed: I have personally reviewed following labs and imaging studies  CBC: Recent Labs  Lab 01/19/20 2050 01/20/20 0608  WBC 4.5 4.2  NEUTROABS 3.1 3.1  HGB 13.7 12.3*  HCT 41.8 37.5*  MCV 93.1 93.3  PLT 141* 131*    Basic Metabolic Panel: Recent Labs  Lab 01/19/20 2050 01/20/20 0608  NA 133* 136  K 3.8 3.8  CL 99 100  CO2 24 25  GLUCOSE 103* 108*  BUN 13 16  CREATININE 0.98 0.99  CALCIUM 8.7* 8.6*  MG  --  2.5*    GFR: CrCl cannot be calculated (Unknown ideal weight.).  Liver Function Tests: Recent Labs  Lab 01/19/20 2050 01/20/20 0608  AST 43* 39  ALT 30 25  ALKPHOS 39 36*  BILITOT 1.3* 1.5*  PROT 8.2* 7.2  ALBUMIN 4.1 3.7    CBG: No results for input(s): GLUCAP in the last 168 hours.   Recent Results (from the past 240 hour(s))  Blood Culture (routine x 2)     Status: None (Preliminary result)   Collection Time: 01/19/20  8:36 PM   Specimen: BLOOD  Result Value Ref Range Status   Specimen Description   Final    BLOOD RIGHT ANTECUBITAL Performed at [facility], [address] [place], [place] [address]    Special Requests   Final    BOTTLES DRAWN AEROBIC  AND ANAEROBIC Blood Culture adequate volume Performed at [facility] [address], [place], [place] [address]    Culture   Final    NO GROWTH < 12 HOURS Performed at [facility], [address] [place], [place] [address]    Report Status PENDING  Incomplete  SARS Coronavirus 2 by RT PCR (hospital order, performed in [facility] hospital lab) Nasopharyngeal Nasopharyngeal Swab     Status: Abnormal   Collection Time: 01/20/20  3:59 AM   Specimen: Nasopharyngeal Swab  Result  Value Ref Range Status   SARS Coronavirus 2 POSITIVE (Hester Joslin) NEGATIVE Final    Comment: RESULT CALLED TO, READ BACK BY AND VERIFIED WITH: QUICK,M. RN AT 3716 01/20/20 MULLINS,T (NOTE) SARS-CoV-2 target nucleic acids are DETECTED  SARS-CoV-2 RNA is generally detectable in upper respiratory specimens  during the acute phase of infection.  Positive results are indicative  of the presence of the identified virus, but do not rule out bacterial infection or co-infection with other pathogens not detected by the test.  Clinical correlation with patient history and  other diagnostic information is necessary to determine patient infection status.  The expected result is negative.  Fact Sheet for Patients:   BoilerBrush.com.cy   Fact Sheet for Healthcare Providers:   https://pope.com/    This test is not yet approved or cleared by the [place] FDA and  has been authorized for detection and/or diagnosis of SARS-CoV-2 by FDA under an Emergency Use Authorization (EUA).  This EUA will remain in effect (meaning thi s test can be used) for the duration of  the COVID-19 declaration under Section 564(b)(1) of the Act, 21 U.S.C. section 360-bbb-3(b)(1), unless the authorization is terminated or revoked sooner.  Performed at [facility], [address] [place], [place] [address]          Radiology Studies: DG Chest 2  View  Result Date: 01/19/2020 CLINICAL DATA:  Shortness of breath COVID positive EXAM: CHEST - 2 VIEW COMPARISON:  06/03/2018 FINDINGS: Possible small right pleural effusion. Patchy bilateral ground-glass opacities and consolidations. Borderline cardiomegaly.  No pneumothorax. IMPRESSION: Patchy bilateral ground-glass opacities and consolidations, consistent with multifocal pneumonia. Electronically Signed   By: Jasmine Pang M.D.   On: 01/19/2020 19:54        Scheduled Meds:  albuterol  2 puff Inhalation Q6H   amLODipine  10 mg Oral Daily   aspirin EC  81 mg Oral BID   dexamethasone (DECADRON) injection  6 mg Intravenous Q24H   heparin  5,000 Units Subcutaneous Q8H   multivitamin with minerals  1 tablet Oral Daily   Continuous Infusions:  sodium chloride 100 mL/hr at 01/20/20 1404   remdesivir 100 mg in NS 100 mL Stopped (01/20/20 0935)     LOS: 1 day    Time spent: over 30 min    Lacretia Nicks, MD [facility]   To contact the attending provider between 7A-7P or the covering provider during after hours 7P-7A, please log into the web site [email or web address] and access using universal [place] password for that web site. If you do not have the password, please call the hospital operator.  01/20/2020, 4:16 PM

## 2020-01-20 NOTE — ED Notes (Signed)
Breakfast tray given to patient.

## 2020-01-21 LAB — CBC WITH DIFFERENTIAL/PLATELET
Abs Immature Granulocytes: 0.01 10*3/uL (ref 0.00–0.07)
Basophils Absolute: 0 10*3/uL (ref 0.0–0.1)
Basophils Relative: 0 %
Eosinophils Absolute: 0 10*3/uL (ref 0.0–0.5)
Eosinophils Relative: 0 %
HCT: 38.7 % — ABNORMAL LOW (ref 39.0–52.0)
Hemoglobin: 13 g/dL (ref 13.0–17.0)
Immature Granulocytes: 0 %
Lymphocytes Relative: 19 %
Lymphs Abs: 0.8 10*3/uL (ref 0.7–4.0)
MCH: 30.9 pg (ref 26.0–34.0)
MCHC: 33.6 g/dL (ref 30.0–36.0)
MCV: 91.9 fL (ref 80.0–100.0)
Monocytes Absolute: 0.2 10*3/uL (ref 0.1–1.0)
Monocytes Relative: 4 %
Neutro Abs: 3.4 10*3/uL (ref 1.7–7.7)
Neutrophils Relative %: 77 %
Platelets: 154 10*3/uL (ref 150–400)
RBC: 4.21 MIL/uL — ABNORMAL LOW (ref 4.22–5.81)
RDW: 11.5 % (ref 11.5–15.5)
WBC: 4.4 10*3/uL (ref 4.0–10.5)
nRBC: 0 % (ref 0.0–0.2)

## 2020-01-21 LAB — COMPREHENSIVE METABOLIC PANEL
ALT: 27 U/L (ref 0–44)
AST: 38 U/L (ref 15–41)
Albumin: 3.4 g/dL — ABNORMAL LOW (ref 3.5–5.0)
Alkaline Phosphatase: 34 U/L — ABNORMAL LOW (ref 38–126)
Anion gap: 8 (ref 5–15)
BUN: 17 mg/dL (ref 6–20)
CO2: 24 mmol/L (ref 22–32)
Calcium: 8.3 mg/dL — ABNORMAL LOW (ref 8.9–10.3)
Chloride: 99 mmol/L (ref 98–111)
Creatinine, Ser: 0.78 mg/dL (ref 0.61–1.24)
GFR calc Af Amer: >60 mL/min (ref >60)
GFR calc non Af Amer: >60 mL/min (ref >60)
Glucose, Bld: 128 mg/dL — ABNORMAL HIGH (ref 70–99)
Potassium: 4 mmol/L (ref 3.5–5.1)
Sodium: 131 mmol/L — ABNORMAL LOW (ref 135–145)
Total Bilirubin: 1 mg/dL (ref 0.3–1.2)
Total Protein: 7.1 g/dL (ref 6.5–8.1)

## 2020-01-21 LAB — CBG MONITORING, ED: Glucose-Capillary: 96 mg/dL (ref 70–99)

## 2020-01-21 LAB — MAGNESIUM: Magnesium: 2.5 mg/dL — ABNORMAL HIGH (ref 1.7–2.4)

## 2020-01-21 LAB — C-REACTIVE PROTEIN: CRP: 7.9 mg/dL — ABNORMAL HIGH (ref <1.0)

## 2020-01-21 LAB — PHOSPHORUS: Phosphorus: 3.9 mg/dL (ref 2.5–4.6)

## 2020-01-21 LAB — BRAIN NATRIURETIC PEPTIDE: B Natriuretic Peptide: 30.2 pg/mL (ref 0.0–100.0)

## 2020-01-21 LAB — D-DIMER, QUANTITATIVE: D-Dimer, Quant: 0.5 ug/mL-FEU (ref 0.00–0.50)

## 2020-01-21 LAB — FERRITIN: Ferritin: 591 ng/mL — ABNORMAL HIGH (ref 24–336)

## 2020-01-21 NOTE — ED Notes (Signed)
Pt RA Sat while ambulating 89

## 2020-01-21 NOTE — Progress Notes (Signed)
PROGRESS NOTE    [name]  JKD:[id number] DOB: 1975/09/03 DOA: 01/19/2020 PCP: Marcine Matar, MD   Chief Complaint  Patient presents with  . COVID Positive   Brief Narrative:   Wayne [name]  is Wayne [name] 44 y.o. male, with history of obesity, GERD, gout, B12 deficiency, anemia, hypertension presents to the ER with [name] chief complaint of dyspnea.  Patient reports that he has had shortness of breath that is gradually been getting worse over the past several days.  It started 5 days ago, but used to be worse at night and better during the day.  It started becoming more constant, and then today was constant throughout the day.  It is worse with exertion, better with rest.  He has associated cold sweats, chills, chest pain, productive cough with green sputum, intense headache, lightheadedness, fatigue, and body aches.  Patient has taken Motrin with little to no relief of the symptoms.  He reports that he also has nausea, decreased appetite, and has not eaten anything in the last 3 days.  He reports that he has only been drinking about half Calleen Alvis bottle of water per day.  Patient does not smoke or have any known lung disease.  Patient reports that on day 3 of his symptoms he decided to go get vaccinated for Covid.  His symptoms started while he was at [facility].  Patient has no other complaints at this time  In the ED Temperature 103, pulse 107, blood pressure 144/96, respirate is 16, saturating at 93% CBC and CHEM panel were unremarkable D-dimer is 0.50, fibrinogen is 619, LDH is 221, triglycerides 118, ferritin 554, CRP 12.9, lactic acid 0.8, pro-Cal less than 0.10 X-ray shows patchy bilateral groundglass opacities and consolidations consistent with multifocal pneumonia Remdesivir and Decadron were started  Assessment & Plan:   Active Problems:   COVID-19  1. Acute hypoxic respiratory failure 2/2 COVID 19 Pneumonia 1. Unvaccinated (received dose #1 after symptoms began) 2. CXR with multifocal  pneumonia 3. Desatting to high 80's with ambulation, will give another day inpatient, then hopefully d/c 8/15 with plans for remdesivir outpatient if continued improvement 4. Continue steroids and dexamethasone 5. If worsens, consider transition to solumedrol.  Consider adding baricitinib (discussed risks/benefits - infection/malignancy/vte - he's agreeable if necessary).  At this point, hopefully he won't need this escalation in therapy. 6. Elevated CRP, ferritin.  D dimer wnl. -> downtrending, but still elevated 7. I/O, daily weights 8. Prone as able, OOB, IS, flutter.  COVID-19 Labs  Recent Labs    01/19/20 2050 01/20/20 0608 01/21/20 0500  DDIMER 0.50 0.49 0.50  FERRITIN 554* 651* 591*  LDH 221*  --   --   CRP 12.9* 12.5* 7.9*    Lab Results  Component Value Date   SARSCOV2NAA POSITIVE (Lou Loewe) 01/20/2020   2. HTN 1. Continue amlodipine  3. Gout: continue home colchicine and allopurinol  4. Thrombocytopenia: mild, likely 2/2 covid  5. Tachycardia 1. Improved   DVT prophylaxis: heparin Code Status: full  Family Communication: none at bedside Disposition:   Status is: Inpatient  Remains inpatient appropriate because:Inpatient level of care appropriate due to severity of illness   Dispo: The patient is from: Home              Anticipated d/c is to: Home              Anticipated d/c date is: 2 days  Patient currently is not medically stable to d/c.   Consultants:   None  Procedures:   none  Antimicrobials:  Anti-infectives (From admission, onward)   Start     Dose/Rate Route Frequency Ordered Stop   01/20/20 1000  remdesivir 100 mg in sodium chloride 0.9 % 100 mL IVPB     Discontinue    "Followed by" Linked Group Details   100 mg 200 mL/hr over 30 Minutes Intravenous Daily 01/19/20 2250 01/24/20 0959   01/19/20 2300  remdesivir 100 mg in sodium chloride 0.9 % 100 mL IVPB       "Followed by" Linked Group Details   100 mg 200 mL/hr over 30  Minutes Intravenous Every 30 min 01/19/20 2250 01/20/20 0602     Subjective: No new complaints Continued SOB, some weakness when standing and bathing  Objective: Vitals:   01/21/20 1259 01/21/20 1402 01/21/20 1601 01/21/20 1702  BP: 134/90 (!) 143/96 123/64 128/68  Pulse: 90 90 87 84  Resp: 18 18 18 18   Temp:      TempSrc:      SpO2: 95% 94% 94% 92%    Intake/Output Summary (Last 24 hours) at 01/21/2020 1806 Last data filed at 01/21/2020 1034 Gross per 24 hour  Intake 100 ml  Output --  Net 100 ml   There were no vitals filed for this visit.  Examination:  General: No acute distress. Cardiovascular: Heart sounds show Breon Rehm regular rate, and rhythm Lungs: unlabored Abdomen: Soft, nontender, nondistended Neurological: Alert and oriented 3. Moves all extremities 4 . Cranial nerves II through XII grossly intact. Skin: Warm and dry. No rashes or lesions. Extremities: No clubbing or cyanosis. No edema.    Data Reviewed: I have personally reviewed following labs and imaging studies  CBC: Recent Labs  Lab 01/19/20 2050 01/20/20 0608 01/21/20 0500  WBC 4.5 4.2 4.4  NEUTROABS 3.1 3.1 3.4  HGB 13.7 12.3* 13.0  HCT 41.8 37.5* 38.7*  MCV 93.1 93.3 91.9  PLT 141* 131* 154    Basic Metabolic Panel: Recent Labs  Lab 01/19/20 2050 01/20/20 0608 01/21/20 0500  NA 133* 136 131*  K 3.8 3.8 4.0  CL 99 100 99  CO2 24 25 24   GLUCOSE 103* 108* 128*  BUN 13 16 17   CREATININE 0.98 0.99 0.78  CALCIUM 8.7* 8.6* 8.3*  MG  --  2.5* 2.5*  PHOS  --   --  3.9    GFR: CrCl cannot be calculated (Unknown ideal weight.).  Liver Function Tests: Recent Labs  Lab 01/19/20 2050 01/20/20 0608 01/21/20 0500  AST 43* 39 38  ALT 30 25 27   ALKPHOS 39 36* 34*  BILITOT 1.3* 1.5* 1.0  PROT 8.2* 7.2 7.1  ALBUMIN 4.1 3.7 3.4*    CBG: No results for input(s): GLUCAP in the last 168 hours.   Recent Results (from the past 240 hour(s))  Blood Culture (routine x 2)     Status: None  (Preliminary result)   Collection Time: 01/19/20  8:36 PM   Specimen: BLOOD  Result Value Ref Range Status   Specimen Description   Final    BLOOD RIGHT ANTECUBITAL Performed at [facility], [address] [place], [place] [address]    Special Requests   Final    BOTTLES DRAWN AEROBIC AND ANAEROBIC Blood Culture adequate volume Performed at [facility], [address] [place], [place] [address]    Culture   Final    NO GROWTH 2  DAYS Performed at [facility], [address], [place], [place] [address]    Report Status PENDING  Incomplete  Blood Culture (routine x 2)     Status: None (Preliminary result)   Collection Time: 01/20/20 12:30 AM   Specimen: BLOOD  Result Value Ref Range Status   Specimen Description   Final    BLOOD LEFT ANTECUBITAL Performed at [facility], [address], [place], [place] [address]    Special Requests   Final    BOTTLES DRAWN AEROBIC AND ANAEROBIC Blood Culture results may not be optimal due to an excessive volume of blood received in culture bottles Performed at [facility], [address] [place], [place] [address]    Culture   Final    NO GROWTH 1 DAY Performed at [facility], [address] [place], [place] [address]    Report Status PENDING  Incomplete  SARS Coronavirus 2 by RT PCR (hospital order, performed in [facility] hospital lab) Nasopharyngeal Nasopharyngeal Swab     Status: Abnormal   Collection Time: 01/20/20  3:59 AM   Specimen: Nasopharyngeal Swab  Result Value Ref Range Status   SARS Coronavirus 2 POSITIVE (Julliette Frentz) NEGATIVE Final    Comment: RESULT CALLED TO, READ BACK BY AND VERIFIED WITH: QUICK,M. RN AT 5625 01/20/20 MULLINS,T (NOTE) SARS-CoV-2 target nucleic acids are DETECTED  SARS-CoV-2 RNA is generally detectable in upper respiratory specimens  during the acute phase of infection.  Positive results are indicative  of the  presence of the identified virus, but do not rule out bacterial infection or co-infection with other pathogens not detected by the test.  Clinical correlation with patient history and  other diagnostic information is necessary to determine patient infection status.  The expected result is negative.  Fact Sheet for Patients:   BoilerBrush.com.cy   Fact Sheet for Healthcare Providers:   https://pope.com/    This test is not yet approved or cleared by the [place] FDA and  has been authorized for detection and/or diagnosis of SARS-CoV-2 by FDA under an Emergency Use Authorization (EUA).  This EUA will remain in effect (meaning thi s test can be used) for the duration of  the COVID-19 declaration under Section 564(b)(1) of the Act, 21 U.S.C. section 360-bbb-3(b)(1), unless the authorization is terminated or revoked sooner.  Performed at [facility], [address] [place], [place] [address]          Radiology Studies: DG Chest 2 View  Result Date: 01/19/2020 CLINICAL DATA:  Shortness of breath COVID positive EXAM: CHEST - 2 VIEW COMPARISON:  06/03/2018 FINDINGS: Possible small right pleural effusion. Patchy bilateral ground-glass opacities and consolidations. Borderline cardiomegaly.  No pneumothorax. IMPRESSION: Patchy bilateral ground-glass opacities and consolidations, consistent with multifocal pneumonia. Electronically Signed   By: Jasmine Pang M.D.   On: 01/19/2020 19:54        Scheduled Meds: . albuterol  2 puff Inhalation Q6H  . allopurinol  300 mg Oral Daily  . amLODipine  10 mg Oral Daily  . aspirin EC  81 mg Oral BID  . colchicine  0.6 mg Oral Daily  . dexamethasone (DECADRON) injection  6 mg Intravenous Q24H  . heparin  5,000 Units Subcutaneous Q8H  . multivitamin with minerals  1 tablet Oral Daily   Continuous Infusions: . remdesivir 100 mg in NS 100 mL Stopped (01/21/20 1034)      LOS: 2 days    Time spent:  over 30 min    Lacretia Nicks, MD [facility]   To contact the attending provider between 7A-7P or the covering provider during after hours 7P-7A, please log into the web site [email or web address] and access using universal [place] password for that web site. If you do not have the password, please call the hospital operator.  01/21/2020, 6:06 PM

## 2020-01-22 DIAGNOSIS — J9601 Acute respiratory failure with hypoxia: Secondary | ICD-10-CM | POA: Insufficient documentation

## 2020-01-22 LAB — PHOSPHORUS: Phosphorus: 4.1 mg/dL (ref 2.5–4.6)

## 2020-01-22 LAB — CBC WITH DIFFERENTIAL/PLATELET
Abs Immature Granulocytes: 0.02 10*3/uL (ref 0.00–0.07)
Basophils Absolute: 0 10*3/uL (ref 0.0–0.1)
Basophils Relative: 0 %
Eosinophils Absolute: 0 10*3/uL (ref 0.0–0.5)
Eosinophils Relative: 0 %
HCT: 41.3 % (ref 39.0–52.0)
Hemoglobin: 13.4 g/dL (ref 13.0–17.0)
Immature Granulocytes: 0 %
Lymphocytes Relative: 26 %
Lymphs Abs: 1.4 10*3/uL (ref 0.7–4.0)
MCH: 30.2 pg (ref 26.0–34.0)
MCHC: 32.4 g/dL (ref 30.0–36.0)
MCV: 93.2 fL (ref 80.0–100.0)
Monocytes Absolute: 0.4 10*3/uL (ref 0.1–1.0)
Monocytes Relative: 7 %
Neutro Abs: 3.5 10*3/uL (ref 1.7–7.7)
Neutrophils Relative %: 67 %
Platelets: 184 10*3/uL (ref 150–400)
RBC: 4.43 MIL/uL (ref 4.22–5.81)
RDW: 11.4 % — ABNORMAL LOW (ref 11.5–15.5)
WBC: 5.3 10*3/uL (ref 4.0–10.5)
nRBC: 0 % (ref 0.0–0.2)

## 2020-01-22 LAB — COMPREHENSIVE METABOLIC PANEL
ALT: 25 U/L (ref 0–44)
AST: 30 U/L (ref 15–41)
Albumin: 3.5 g/dL (ref 3.5–5.0)
Alkaline Phosphatase: 38 U/L (ref 38–126)
Anion gap: 9 (ref 5–15)
BUN: 16 mg/dL (ref 6–20)
CO2: 25 mmol/L (ref 22–32)
Calcium: 8.5 mg/dL — ABNORMAL LOW (ref 8.9–10.3)
Chloride: 102 mmol/L (ref 98–111)
Creatinine, Ser: 0.82 mg/dL (ref 0.61–1.24)
GFR calc Af Amer: >60 mL/min (ref >60)
GFR calc non Af Amer: >60 mL/min (ref >60)
Glucose, Bld: 97 mg/dL (ref 70–99)
Potassium: 3.9 mmol/L (ref 3.5–5.1)
Sodium: 136 mmol/L (ref 135–145)
Total Bilirubin: 1 mg/dL (ref 0.3–1.2)
Total Protein: 7.4 g/dL (ref 6.5–8.1)

## 2020-01-22 LAB — FERRITIN: Ferritin: 440 ng/mL — ABNORMAL HIGH (ref 24–336)

## 2020-01-22 LAB — MAGNESIUM: Magnesium: 2.6 mg/dL — ABNORMAL HIGH (ref 1.7–2.4)

## 2020-01-22 LAB — D-DIMER, QUANTITATIVE: D-Dimer, Quant: 0.39 ug/mL-FEU (ref 0.00–0.50)

## 2020-01-22 LAB — BRAIN NATRIURETIC PEPTIDE: B Natriuretic Peptide: 32.8 pg/mL (ref 0.0–100.0)

## 2020-01-22 LAB — C-REACTIVE PROTEIN: CRP: 5.9 mg/dL — ABNORMAL HIGH (ref <1.0)

## 2020-01-22 NOTE — Progress Notes (Signed)
PROGRESS NOTE    Wayne [name]  ZOX:[id number] DOB: 08/18/1975 DOA: 01/19/2020 PCP: Wayne MatarJohnson, Deborah B, MD   Chief Complaint  Patient presents with  . COVID Positive   Brief Narrative:   Wayne RobinsonLashuarn [name]  is Wayne [name] 44 y.o. male, with history of obesity, GERD, gout, B12 deficiency, anemia, hypertension presents to the ER with Wayne [name] chief complaint of dyspnea.  Patient reports that he has had shortness of breath that is gradually been getting worse over the past several days.  It started 5 days ago, but used to be worse at night and better during the day.  It started becoming more constant, and then today was constant throughout the day.  It is worse with exertion, better with rest.  He has associated cold sweats, chills, chest pain, productive cough with green sputum, intense headache, lightheadedness, fatigue, and body aches.  Patient has taken Motrin with little to no relief of the symptoms.  He reports that he also has nausea, decreased appetite, and has not eaten anything in the last 3 days.  He reports that he has only been drinking about half [name] bottle of water per day.  Patient does not smoke or have any known lung disease.  Patient reports that on day 3 of his symptoms he decided to go get vaccinated for Covid.  [facility]  Patient has no other complaints at this time  In the ED Temperature 103, pulse 107, blood pressure 144/96, respirate is 16, saturating at 93% CBC and CHEM panel were unremarkable D-dimer is 0.50, fibrinogen is 619, LDH is 221, triglycerides 118, ferritin 554, CRP 12.9, lactic acid 0.8, pro-Cal less than 0.10 X-ray shows patchy bilateral groundglass opacities and consolidations consistent with multifocal pneumonia Remdesivir and Decadron were started  Assessment & Plan:   Active Problems:   COVID-19  1. Acute hypoxic respiratory failure 2/2 COVID 19 Pneumonia 1. Unvaccinated (received dose #1 after symptoms began) 2. CXR with multifocal  pneumonia 3. Desatting < 88% on RA with ambulation, will continue to monitor with another day and see where he is tomorrow  4. Continue steroids and dexamethasone 5. Elevated CRP, ferritin.  D dimer wnl. -> downtrending, but still elevated 6. I/O, daily weights 7. Prone as able, OOB, IS, flutter.  COVID-19 Labs  Recent Labs    01/19/20 2050 01/19/20 2050 01/20/20 0608 01/21/20 0500 01/22/20 0430  DDIMER 0.50   < > 0.49 0.50 0.39  FERRITIN 554*   < > 651* 591* 440*  LDH 221*  --   --   --   --   CRP 12.9*   < > 12.5* 7.9* 5.9*   < > = values in this interval not displayed.    Lab Results  Component Value Date   SARSCOV2NAA POSITIVE (Wayne [name]) 01/20/2020   2. HTN 1. Continue amlodipine  3. Gout: continue home colchicine and allopurinol  4. Thrombocytopenia: mild, likely 2/2 covid  5. Tachycardia 1. Improved   DVT prophylaxis: heparin Code Status: full  Family Communication: none at bedside Disposition:   Status is: Inpatient  Remains inpatient appropriate because:Inpatient level of care appropriate due to severity of illness   Dispo: The patient is from: Home              Anticipated d/c is to: Home              Anticipated d/c date is: 2 days  Patient currently is not medically stable to d/c.   Consultants:   None  Procedures:   none  Antimicrobials:  Anti-infectives (From admission, onward)   Start     Dose/Rate Route Frequency Ordered Stop   01/20/20 1000  remdesivir 100 mg in sodium chloride 0.9 % 100 mL IVPB     Discontinue    "Followed by" Linked Group Details   100 mg 200 mL/hr over 30 Minutes Intravenous Daily 01/19/20 2250 01/24/20 0959   01/19/20 2300  remdesivir 100 mg in sodium chloride 0.9 % 100 mL IVPB       "Followed by" Linked Group Details   100 mg 200 mL/hr over 30 Minutes Intravenous Every 30 min 01/19/20 2250 01/20/20 0602     Subjective: No new complaints  Objective: Vitals:   01/22/20 0451 01/22/20 0658 01/22/20  0957 01/22/20 1100  BP: 131/88 (!) 155/100 (!) 157/105   Pulse: 85 85 87   Resp: 18 18 16    Temp:  98.5 F (36.9 C) 98 F (36.7 C)   TempSrc:  Oral Oral   SpO2: 93% 91% 92% (!) 86%   No intake or output data in the 24 hours ending 01/22/20 1306 There were no vitals filed for this visit.  Examination:  General: No acute distress. Cardiovascular: Heart sounds show Wayne [name] regular rate, and rhythm Lungs: unlabored Abdomen: Soft, nontender, nondistended  Neurological: Alert and oriented 3. Moves all extremities 4. Cranial nerves II through XII grossly intact. Skin: Warm and dry. No rashes or lesions. Extremities: No clubbing or cyanosis. No edema.   Data Reviewed: I have personally reviewed following labs and imaging studies  CBC: Recent Labs  Lab 01/19/20 2050 01/20/20 0608 01/21/20 0500 01/22/20 0430  WBC 4.5 4.2 4.4 5.3  NEUTROABS 3.1 3.1 3.4 3.5  HGB 13.7 12.3* 13.0 13.4  HCT 41.8 37.5* 38.7* 41.3  MCV 93.1 93.3 91.9 93.2  PLT 141* 131* 154 184    Basic Metabolic Panel: Recent Labs  Lab 01/19/20 2050 01/20/20 0608 01/21/20 0500 01/22/20 0430  NA 133* 136 131* 136  K 3.8 3.8 4.0 3.9  CL 99 100 99 102  CO2 24 25 24 25   GLUCOSE 103* 108* 128* 97  BUN 13 16 17 16   CREATININE 0.98 0.99 0.78 0.82  CALCIUM 8.7* 8.6* 8.3* 8.5*  MG  --  2.5* 2.5* 2.6*  PHOS  --   --  3.9 4.1    GFR: CrCl cannot be calculated (Unknown ideal weight.).  Liver Function Tests: Recent Labs  Lab 01/19/20 2050 01/20/20 0608 01/21/20 0500 01/22/20 0430  AST 43* 39 38 30  ALT 30 25 27 25   ALKPHOS 39 36* 34* 38  BILITOT 1.3* 1.5* 1.0 1.0  PROT 8.2* 7.2 7.1 7.4  ALBUMIN 4.1 3.7 3.4* 3.5    CBG: Recent Labs  Lab 01/21/20 2130  GLUCAP 96     Recent Results (from the past 240 hour(s))  Blood Culture (routine x 2)     Status: None (Preliminary result)   Collection Time: 01/19/20  8:36 PM   Specimen: BLOOD  Result Value Ref Range Status   Specimen Description   Final     BLOOD RIGHT ANTECUBITAL Performed at [facility], [address], [place], [place]    Special Requests   Final    BOTTLES DRAWN AEROBIC AND ANAEROBIC Blood Culture adequate volume Performed at [facility], [address], [place], [place]    Culture  Final    NO GROWTH 3 DAYS Performed at [facility], [address], [place], [place] [address]    Report Status PENDING  Incomplete  Blood Culture (routine x 2)     Status: None (Preliminary result)   Collection Time: 01/20/20 12:30 AM   Specimen: BLOOD  Result Value Ref Range Status   Specimen Description   Final    BLOOD LEFT ANTECUBITAL Performed at [facility], [address], [place], [place] [address]    Special Requests   Final    BOTTLES DRAWN AEROBIC AND ANAEROBIC Blood Culture results may not be optimal due to an excessive volume of blood received in culture bottles Performed at [facility], [address] [place], [place] [address]    Culture   Final    NO GROWTH 2 DAYS Performed at [facility], [address] [place], [place] [address]    Report Status PENDING  Incomplete  SARS Coronavirus 2 by RT PCR (hospital order, performed in [facility] hospital lab) Nasopharyngeal Nasopharyngeal Swab     Status: Abnormal   Collection Time: 01/20/20  3:59 AM   Specimen: Nasopharyngeal Swab  Result Value Ref Range Status   SARS Coronavirus 2 POSITIVE (Om Lizotte) NEGATIVE Final    Comment: RESULT CALLED TO, READ BACK BY AND VERIFIED WITH: QUICK,M. RN AT 4403 01/20/20 MULLINS,T (NOTE) SARS-CoV-2 target nucleic acids are DETECTED  SARS-CoV-2 RNA is generally detectable in upper respiratory specimens  during the acute phase of infection.  Positive results are indicative  of the presence of the identified virus, but do not rule out bacterial infection or co-infection with other pathogens not detected by the test.  Clinical  correlation with patient history and  other diagnostic information is necessary to determine patient infection status.  The expected result is negative.  Fact Sheet for Patients:   BoilerBrush.com.cy   Fact Sheet for Healthcare Providers:   https://pope.com/    This test is not yet approved or cleared by the [place] FDA and  has been authorized for detection and/or diagnosis of SARS-CoV-2 by FDA under an Emergency Use Authorization (EUA).  This EUA will remain in effect (meaning thi s test can be used) for the duration of  the COVID-19 declaration under Section 564([name])(1) of the Act, 21 U.S.C. section 360-bbb-3([name])(1), unless the authorization is terminated or revoked sooner.  Performed at [facility], [address], [place], [place] [address]          Radiology Studies: No results found.      Scheduled Meds: . albuterol  2 puff Inhalation Q6H  . allopurinol  300 mg Oral Daily  . amLODipine  10 mg Oral Daily  . aspirin EC  81 mg Oral BID  . colchicine  0.6 mg Oral Daily  . dexamethasone (DECADRON) injection  6 mg Intravenous Q24H  . heparin  5,000 Units Subcutaneous Q8H  . multivitamin with minerals  1 tablet Oral Daily   Continuous Infusions: . remdesivir 100 mg in NS 100 mL Stopped (01/22/20 1151)     LOS: 3 days    Time spent: over 30 min    Lacretia Nicks, MD [facility]   To contact the attending provider between 7A-7P or the covering provider during after hours 7P-7A, please log into the web site [email or web address] and access using universal [place] password for that web site. If you do not have the password, please call the hospital operator.  01/22/2020, 1:06 PM

## 2020-01-23 ENCOUNTER — Ambulatory Visit (HOSPITAL_COMMUNITY): Payer: HRSA Program

## 2020-01-23 LAB — COMPREHENSIVE METABOLIC PANEL
ALT: 28 U/L (ref 0–44)
AST: 46 U/L — ABNORMAL HIGH (ref 15–41)
Albumin: 3.7 g/dL (ref 3.5–5.0)
Alkaline Phosphatase: 36 U/L — ABNORMAL LOW (ref 38–126)
Anion gap: 12 (ref 5–15)
BUN: 15 mg/dL (ref 6–20)
CO2: 24 mmol/L (ref 22–32)
Calcium: 8.7 mg/dL — ABNORMAL LOW (ref 8.9–10.3)
Chloride: 101 mmol/L (ref 98–111)
Creatinine, Ser: 0.87 mg/dL (ref 0.61–1.24)
GFR calc Af Amer: >60 mL/min (ref >60)
GFR calc non Af Amer: >60 mL/min (ref >60)
Glucose, Bld: 93 mg/dL (ref 70–99)
Potassium: 4.3 mmol/L (ref 3.5–5.1)
Sodium: 137 mmol/L (ref 135–145)
Total Bilirubin: 1.2 mg/dL (ref 0.3–1.2)
Total Protein: 7.7 g/dL (ref 6.5–8.1)

## 2020-01-23 LAB — CBC WITH DIFFERENTIAL/PLATELET
Abs Immature Granulocytes: 0.02 10*3/uL (ref 0.00–0.07)
Basophils Absolute: 0 10*3/uL (ref 0.0–0.1)
Basophils Relative: 0 %
Eosinophils Absolute: 0 10*3/uL (ref 0.0–0.5)
Eosinophils Relative: 1 %
HCT: 40.7 % (ref 39.0–52.0)
Hemoglobin: 13.4 g/dL (ref 13.0–17.0)
Immature Granulocytes: 1 %
Lymphocytes Relative: 40 %
Lymphs Abs: 1.6 10*3/uL (ref 0.7–4.0)
MCH: 30.5 pg (ref 26.0–34.0)
MCHC: 32.9 g/dL (ref 30.0–36.0)
MCV: 92.5 fL (ref 80.0–100.0)
Monocytes Absolute: 0.4 10*3/uL (ref 0.1–1.0)
Monocytes Relative: 9 %
Neutro Abs: 2.1 10*3/uL (ref 1.7–7.7)
Neutrophils Relative %: 49 %
Platelets: 204 10*3/uL (ref 150–400)
RBC: 4.4 MIL/uL (ref 4.22–5.81)
RDW: 11.7 % (ref 11.5–15.5)
WBC: 4.1 10*3/uL (ref 4.0–10.5)
nRBC: 0 % (ref 0.0–0.2)

## 2020-01-23 LAB — C-REACTIVE PROTEIN: CRP: 4.4 mg/dL — ABNORMAL HIGH (ref <1.0)

## 2020-01-23 LAB — MAGNESIUM: Magnesium: 2.5 mg/dL — ABNORMAL HIGH (ref 1.7–2.4)

## 2020-01-23 LAB — FERRITIN: Ferritin: 448 ng/mL — ABNORMAL HIGH (ref 24–336)

## 2020-01-23 MED ORDER — DIPHENHYDRAMINE HCL 50 MG/ML IJ SOLN: 50.0000 mg | Freq: Once | INTRAMUSCULAR | Status: DC | PRN

## 2020-01-23 MED ORDER — ALBUTEROL SULFATE HFA 108 (90 BASE) MCG/ACT IN AERS: 2.0000 | INHALATION_SPRAY | Freq: Once | RESPIRATORY_TRACT | Status: DC | PRN

## 2020-01-23 MED ORDER — SODIUM CHLORIDE 0.9 % IV SOLN: INTRAVENOUS | Status: DC | PRN

## 2020-01-23 MED ORDER — DEXAMETHASONE 6 MG PO TABS
6.0000 mg | ORAL_TABLET | Freq: Every day | ORAL | 0 refills | Status: AC
Start: 2020-01-23 — End: 2020-01-28

## 2020-01-23 MED ORDER — SODIUM CHLORIDE 0.9 % IV SOLN
100.0000 mg | Freq: Once | INTRAVENOUS | Status: AC
  Administered 2020-01-23: 100 mg via INTRAVENOUS
  Filled 2020-01-23: qty 20

## 2020-01-23 MED ORDER — METHYLPREDNISOLONE SODIUM SUCC 125 MG IJ SOLR: 125.0000 mg | Freq: Once | INTRAMUSCULAR | Status: DC | PRN

## 2020-01-23 MED ORDER — FAMOTIDINE IN NACL 20-0.9 MG/50ML-% IV SOLN: 20.0000 mg | Freq: Once | INTRAVENOUS | Status: DC | PRN

## 2020-01-23 MED ORDER — EPINEPHRINE 0.3 MG/0.3ML IJ SOAJ
0.3000 mg | Freq: Once | INTRAMUSCULAR | Status: DC | PRN
  Filled 2020-01-23: qty 0.6

## 2020-01-23 MED FILL — DEXAMETHASONE 6 MG TABLET: 6 | 5 days supply | Qty: 5 | Fill #0

## 2020-01-23 NOTE — Discharge Instructions (Signed)
Quarantine until 02/10/2020.  10 Things You Can Do to Manage Your COVID-19 Symptoms at Home If you have possible or confirmed COVID-19: 1. Stay home from work and school. And stay away from other public places. If you must go out, avoid using any kind of public transportation, ridesharing, or taxis. 2. Monitor your symptoms carefully. If your symptoms get worse, call your healthcare provider immediately. 3. Get rest and stay hydrated. 4. If you have Katelen Luepke medical appointment, call the healthcare provider ahead of time and tell them that you have or may have COVID-19. 5. For medical emergencies, call 911 and notify the dispatch personnel that you have or may have COVID-19. 6. Cover your cough and sneezes with Marsean Elkhatib tissue or use the inside of your elbow. 7. Wash your hands often with soap and water for at least 20 seconds or clean your hands with an alcohol-based hand sanitizer that contains at least 60% alcohol. 8. As much as possible, stay in Ridge Lafond specific room and away from other people in your home. Also, you should use Yukiko Minnich separate bathroom, if available. If you need to be around other people in or outside of the home, wear Aleathia Purdy mask. 9. Avoid sharing personal items with other people in your household, like dishes, towels, and bedding. 10. Clean all surfaces that are touched often, like counters, tabletops, and doorknobs. Use household cleaning sprays or wipes according to the label instructions. SouthAmericaFlowers.co.uk 12/08/2018 This information is not intended to replace advice given to you by your health care provider. Make sure you discuss any questions you have with your health care provider. Document Revised: 05/12/2019 Document Reviewed: 05/12/2019 Elsevier Patient Education  2020 Elsevier Inc.   Acute Respiratory Failure, Adult  Acute respiratory failure occurs when there is not enough oxygen passing from your lungs to your body. When this happens, your lungs have trouble removing carbon dioxide from  the blood. This causes your blood oxygen level to drop too low as carbon dioxide builds up. Acute respiratory failure is Nelwyn Hebdon medical emergency. It can develop quickly, but it is temporary if treated promptly. Your lung capacity, or how much air your lungs can hold, may improve with time, exercise, and treatment. What are the causes? There are many possible causes of acute respiratory failure, including:  Lung injury.  Chest injury or damage to the ribs or tissues near the lungs.  Lung conditions that affect the flow of air and blood into and out of the lungs, such as pneumonia, acute respiratory distress syndrome, and cystic fibrosis.  Medical conditions, such as strokes or spinal cord injuries, that affect the muscles and nerves that control breathing.  Blood infection (sepsis).  Inflammation of the pancreas (pancreatitis).  Brace Welte blood clot in the lungs (pulmonary embolism).  Doak Mah large-volume blood transfusion.  Burns.  Near-drowning.  Seizure.  Smoke inhalation.  Reaction to medicines.  Alcohol or drug overdose. What increases the risk? This condition is more likely to develop in people who have:  Estee Yohe blocked airway.  Asthma.  Joanne Brander condition or disease that damages or weakens the muscles, nerves, bones, or tissues that are involved in breathing.  Nasim Garofano serious infection.  Adalaya Irion health problem that blocks the unconscious reflex that is involved in breathing, such as hypothyroidism or sleep apnea.  Abdikadir Fohl lung injury or trauma. What are the signs or symptoms? Trouble breathing is the main symptom of acute respiratory failure. Symptoms may also include:  Rapid breathing.  Restlessness or anxiety.  Skin, lips, or fingernails that appear  blue (cyanosis).  Rapid heart rate.  Abnormal heart rhythms (arrhythmias).  Confusion or changes in behavior.  Tiredness or loss of energy.  Feeling sleepy or having Dorri Ozturk loss of consciousness. How is this diagnosed? Your health care provider can  diagnose acute respiratory failure with Markise Haymer medical history and physical exam. During the exam, your health care provider will listen to your heart and check for crackling or wheezing sounds in your lungs. Your may also have tests to confirm the diagnosis and determine what is causing respiratory failure. These tests may include:  Measuring the amount of oxygen in your blood (pulse oximetry). The measurement comes from Deandre Brannan small device that is placed on your finger, earlobe, or toe.  Other blood tests to measure blood gases and to look for signs of infection.  Sampling your cerebral spinal fluid or tracheal fluid to check for infections.  Chest X-ray to look for fluid in spaces that should be filled with air.  Electrocardiogram (ECG) to look at the heart's electrical activity. How is this treated? Treatment for this condition usually takes places in Patricio Popwell hospital intensive care unit (ICU). Treatment depends on what is causing the condition. It may include one or more treatments until your symptoms improve. Treatment may include:  Supplemental oxygen. Extra oxygen is given through Mianna Iezzi tube in the nose, Wasim Hurlbut face mask, or Emalene Welte hood.  Shamon Lobo device such as Ariela Mochizuki continuous positive airway pressure (CPAP) or bi-level positive airway pressure (BiPAP or BPAP) machine. This treatment uses mild air pressure to keep the airways open. Sada Mazzoni mask or other device will be placed over your nose or mouth. Nichole Keltner tube that is connected to Mandy Fitzwater motor will deliver oxygen through the mask.  Ventilator. This treatment helps move air into and out of the lungs. This may be done with Denaya Horn bag and mask or Luken Shadowens machine. For this treatment, Kameela Leipold tube is placed in your windpipe (trachea) so air and oxygen can flow to the lungs.  Extracorporeal membrane oxygenation (ECMO). This treatment temporarily takes over the function of the heart and lungs, supplying oxygen and removing carbon dioxide. ECMO gives the lungs Carisma Troupe chance to recover. It may be used if Lisha Vitale ventilator is  not effective.  Tracheostomy. This is Mika Anastasi procedure that creates Ethell Blatchford hole in the neck to insert Arelys Glassco breathing tube.  Receiving fluids and medicines.  Rocking the bed to help breathing. Follow these instructions at home:  Take over-the-counter and prescription medicines only as told by your health care provider.  Return to normal activities as told by your health care provider. Ask your health care provider what activities are safe for you.  Keep all follow-up visits as told by your health care provider. This is important. How is this prevented? Treating infections and medical conditions that may lead to acute respiratory failure can help prevent the condition from developing. Contact Maki Hege health care provider if:  You have Brynne Doane fever.  Your symptoms do not improve or they get worse. Get help right away if:  You are having trouble breathing.  You lose consciousness.  Your have cyanosis or turn blue.  You develop Tinie Mcgloin rapid heart rate.  You are confused. These symptoms may represent Alazne Quant serious problem that is an emergency. Do not wait to see if the symptoms will go away. Get medical help right away. Call your local emergency services (911 in the [place] Do not drive yourself to the hospital. This information is not intended to replace advice given to you by your health  care provider. Make sure you discuss any questions you have with your health care provider. Document Revised: 05/08/2017 Document Reviewed: 12/12/2015 Elsevier Patient Education  2020 [employer].    COVID-19 Frequently Asked Questions COVID-19 (coronavirus disease) is an infection that is caused by Geet Hosking large family of viruses. Some viruses cause illness in people and others cause illness in animals like camels, cats, and bats. In some cases, the viruses that cause illness in animals can spread to humans. Where did the coronavirus come from? In December 2019, [place] told the [employer] [facility]) of several cases of  lung disease (human respiratory illness). These cases were linked to an open seafood and livestock market in the city of [place]. The link to the seafood and livestock market suggests that the virus may have spread from animals to humans. However, since that first outbreak in December, the virus has also been shown to spread from person to person. What is the name of the disease and the virus? Disease name Early on, this disease was called novel coronavirus. This is because scientists determined that the disease was caused by Jaicion Laurie new (novel) respiratory virus. The World Health Organization [facility]) has now named the disease COVID-19, or coronavirus disease. Virus name The virus that causes the disease is called severe acute respiratory syndrome coronavirus 2 (SARS-CoV-2). More information on disease and virus naming World Health Organization [facility]): www.who.int/emergencies/diseases/novel-coronavirus-2019/technical-guidance/naming-the-coronavirus-disease-(covid-2019)-and-the-virus-that-causes-it Who is at risk for complications from coronavirus disease? Some people may be at higher risk for complications from coronavirus disease. This includes older adults and people who have chronic diseases, such as heart disease, diabetes, and lung disease. If you are at higher risk for complications, take these extra precautions:  Stay home as much as possible.  Avoid social gatherings and travel.  Avoid close contact with others. Stay at least 6 ft (2 m) away from others, if possible.  Wash your hands often with soap and water for at least 20 seconds.  Avoid touching your face, mouth, nose, or eyes.  Keep supplies on hand at home, such as food, medicine, and cleaning supplies.  If you must go out in public, wear Jionni Helming cloth face covering or face mask. Make sure your mask covers your nose and mouth. How does coronavirus disease spread? The virus that causes coronavirus disease spreads easily from person to person  (is contagious). You may catch the virus by:  Breathing in droplets from an infected person. Droplets can be spread by Thayne Cindric person breathing, speaking, singing, coughing, or sneezing.  Touching something, like Ceclia Koker table or Valeri Sula doorknob, that was exposed to the virus (contaminated) and then touching your mouth, nose, or eyes. Can I get the virus from touching surfaces or objects? There is still Marinna Blane lot that we do not know about the virus that causes coronavirus disease. Scientists are basing Kaedin Hicklin lot of information on what they know about similar viruses, such as:  Viruses cannot generally survive on surfaces for long. They need Chyenne Sobczak human body (host) to survive.  It is more likely that the virus is spread by close contact with people who are sick (direct contact), such as through: ? Shaking hands or hugging. ? Breathing in respiratory droplets that travel through the air. Droplets can be spread by Mellanie Bejarano person breathing, speaking, singing, coughing, or sneezing.  It is less likely that the virus is spread when Arkin Imran person touches Isaic Syler surface or object that has the virus on it (indirect contact). The virus may be able to enter the  body if the person touches Kasean Denherder surface or object and then touches his or her face, eyes, nose, or mouth. Can Levester Waldridge person spread the virus without having symptoms of the disease? It may be possible for the virus to spread before Lexx Monte person has symptoms of the disease, but this is most likely not the main way the virus is spreading. It is more likely for the virus to spread by being in close contact with people who are sick and breathing in the respiratory droplets spread by Ladeana Laplant person breathing, speaking, singing, coughing, or sneezing. What are the symptoms of coronavirus disease? Symptoms vary from person to person and can range from mild to severe. Symptoms may include:  Fever or chills.  Cough.  Difficulty breathing or feeling short of breath.  Headaches, body aches, or muscle aches.  Runny  or stuffy (congested) nose.  Sore throat.  New loss of taste or smell.  Nausea, vomiting, or diarrhea. These symptoms can appear anywhere from 2 to 14 days after you have been exposed to the virus. Some people may not have any symptoms. If you develop symptoms, call your health care provider. People with severe symptoms may need hospital care. Should I be tested for this virus? Your health care provider will decide whether to test you based on your symptoms, history of exposure, and your risk factors. How does Halena Mohar health care provider test for this virus? Health care providers will collect samples to send for testing. Samples may include:  Taking Kateryna Grantham swab of fluid from the back of your nose and throat, your nose, or your throat.  Taking fluid from the lungs by having you cough up mucus (sputum) into Jasani Dolney sterile cup.  Taking Johanan Skorupski blood sample. Is there Agata Lucente treatment or vaccine for this virus? Currently, there is no vaccine to prevent coronavirus disease. Also, there are no medicines like antibiotics or antivirals to treat the virus. Yusif Gnau person who becomes sick is given supportive care, which means rest and fluids. Jermale Crass person may also relieve his or her symptoms by using over-the-counter medicines that treat sneezing, coughing, and runny nose. These are the same medicines that Osinachi Navarrette person takes for the common cold. If you develop symptoms, call your health care provider. People with severe symptoms may need hospital care. What can I do to protect myself and my family from this virus?     You can protect yourself and your family by taking the same actions that you would take to prevent the spread of other viruses. Take the following actions:  Wash your hands often with soap and water for at least 20 seconds. If soap and water are not available, use alcohol-based hand sanitizer.  Avoid touching your face, mouth, nose, or eyes.  Cough or sneeze into Talyia Allende tissue, sleeve, or elbow. Do not cough or sneeze into  your hand or the air. ? If you cough or sneeze into Sheva Mcdougle tissue, throw it away immediately and wash your hands.  Disinfect objects and surfaces that you frequently touch every day.  Stay away from people who are sick.  Avoid going out in public, follow guidance from your state and local health authorities.  Avoid crowded indoor spaces. Stay at least 6 ft (2 m) away from others.  If you must go out in public, wear Josep Luviano cloth face covering or face mask. Make sure your mask covers your nose and mouth.  Stay home if you are sick, except to get medical care. Call your health care provider  before you get medical care. Your health care provider will tell you how long to stay home.  Make sure your vaccines are up to date. Ask your health care provider what vaccines you need. What should I do if I need to travel? Follow travel recommendations from your local health authority, the CDC, and WHO. Travel information and advice  Centers for Disease Control and Prevention (CDC): GeminiCard.gl  World Health Organization [facility]): PreviewDomains.se Know the risks and take action to protect your health  You are at higher risk of getting coronavirus disease if you are traveling to areas with an outbreak or if you are exposed to travelers from areas with an outbreak.  Wash your hands often and practice good hygiene to lower the risk of catching or spreading the virus. What should I do if I am sick? General instructions to stop the spread of infection  Wash your hands often with soap and water for at least 20 seconds. If soap and water are not available, use alcohol-based hand sanitizer.  Cough or sneeze into Isiaih Hollenbach tissue, sleeve, or elbow. Do not cough or sneeze into your hand or the air.  If you cough or sneeze into Mindy Gali tissue, throw it away immediately and wash your hands.  Stay home unless you must get medical care.  Call your health care provider or local health authority before you get medical care.  Avoid public areas. Do not take public transportation, if possible.  If you can, wear Charlie Seda mask if you must go out of the house or if you are in close contact with someone who is not sick. Make sure your mask covers your nose and mouth. Keep your home clean  Disinfect objects and surfaces that are frequently touched every day. This may include: ? Counters and tables. ? Doorknobs and light switches. ? Sinks and faucets. ? Electronics such as phones, remote controls, keyboards, computers, and tablets.  Wash dishes in hot, soapy water or use Callie Bunyard dishwasher. Air-dry your dishes.  Wash laundry in hot water. Prevent infecting other household members  Let healthy household members care for children and pets, if possible. If you have to care for children or pets, wash your hands often and wear Manan Olmo mask.  Sleep in Mariel Gaudin different bedroom or bed, if possible.  Do not share personal items, such as razors, toothbrushes, deodorant, combs, brushes, towels, and washcloths. Where to find more information Centers for Disease Control and Prevention (CDC)  Information and news updates: CardRetirement.cz World Health Organization [facility])  Information and news updates: AffordableSalon.es  Coronavirus health topic: https://thompson-craig.com/  Questions and answers on COVID-19: kruiseway.com  Global tracker: who.sprinklr.com American Academy of Pediatrics (AAP)  Information for families: www.healthychildren.org/English/health-issues/conditions/chest-lungs/Pages/2019-Novel-Coronavirus.aspx The coronavirus situation is changing rapidly. Check your local health authority website or the CDC and [facility] websites for updates and news. When should I contact Keelin Neville health care provider?  Contact your health care provider if you have symptoms  of an infection, such as fever or cough, and you: ? Have been near anyone who is known to have coronavirus disease. ? Have come into contact with Jozsef Wescoat person who is suspected to have coronavirus disease. ? Have traveled to an area where there is an outbreak of COVID-19. When should I get emergency medical care?  Get help right away by calling your local emergency services (911 in the [place] if you have: ? Trouble breathing. ? Pain or pressure in your chest. ? Confusion. ? Blue-tinged lips and fingernails. ? Difficulty waking from sleep. ?  Symptoms that get worse. Let the emergency medical personnel know if you think you have coronavirus disease. Summary  Darryll Raju new respiratory virus is spreading from person to person and causing COVID-19 (coronavirus disease).  The virus that causes COVID-19 appears to spread easily. It spreads from one person to another through droplets from breathing, speaking, singing, coughing, or sneezing.  Older adults and those with chronic diseases are at higher risk of disease. If you are at higher risk for complications, take extra precautions.  There is currently no vaccine to prevent coronavirus disease. There are no medicines, such as antibiotics or antivirals, to treat the virus.  You can protect yourself and your family by washing your hands often, avoiding touching your face, and covering your coughs and sneezes. This information is not intended to replace advice given to you by your health care provider. Make sure you discuss any questions you have with your health care provider. Document Revised: 03/25/2019 Document Reviewed: 09/21/2018 Elsevier Patient Education  2020 Elsevier Inc.   COVID-19: How to Protect Yourself and Others Know how it spreads  There is currently no vaccine to prevent coronavirus disease 2019 (COVID-19).  The best way to prevent illness is to avoid being exposed to this virus.  The virus is thought to spread mainly from  person-to-person. ? Between people who are in close contact with one another (within about 6 feet). ? Through respiratory droplets produced when an infected person coughs, sneezes or talks. ? These droplets can land in the mouths or noses of people who are nearby or possibly be inhaled into the lungs. ? COVID-19 may be spread by people who are not showing symptoms. Everyone should Clean your hands often  Wash your hands often with soap and water for at least 20 seconds especially after you have been in Juvenal Umar public place, or after blowing your nose, coughing, or sneezing.  If soap and water are not readily available, use Sincerity Cedar hand sanitizer that contains at least 60% alcohol. Cover all surfaces of your hands and rub them together until they feel dry.  Avoid touching your eyes, nose, and mouth with unwashed hands. Avoid close contact  Limit contact with others as much as possible.  Avoid close contact with people who are sick.  Put distance between yourself and other people. ? Remember that some people without symptoms may be able to spread virus. ? This is especially important for people who are at higher risk of getting very RetroStamps.it Cover your mouth and nose with Keta Vanvalkenburgh mask when around others  You could spread COVID-19 to others even if you do not feel sick.  Everyone should wear Leoda Smithhart mask in public settings and when around people not living in their household, especially when social distancing is difficult to maintain. ? Masks should not be placed on young children under age 50, anyone who has trouble breathing, or is unconscious, incapacitated or otherwise unable to remove the mask without assistance.  The mask is meant to protect other people in case you are infected.  Do NOT use Shanese Riemenschneider facemask meant for Sricharan Lacomb Research scientist (physical sciences).  Continue to keep about 6 feet between yourself and others. The mask is not Aadhav Uhlig substitute for  social distancing. Cover coughs and sneezes  Always cover your mouth and nose with Katalyn Matin tissue when you cough or sneeze or use the inside of your elbow.  Throw used tissues in the trash.  Immediately wash your hands with soap and water for at least 20 seconds. If  soap and water are not readily available, clean your hands with Caridad Silveira hand sanitizer that contains at least 60% alcohol. Clean and disinfect  Clean AND disinfect frequently touched surfaces daily. This includes tables, doorknobs, light switches, countertops, handles, desks, phones, keyboards, toilets, faucets, and sinks. ktimeonline.com  If surfaces are dirty, clean them: Use detergent or soap and water prior to disinfection.  Then, use Amato Sevillano household disinfectant. You can see Gracieann Stannard list of EPA-registered household disinfectants here. SouthAmericaFlowers.co.uk 02/09/2019 This information is not intended to replace advice given to you by your health care provider. Make sure you discuss any questions you have with your health care provider. Document Revised: 02/17/2019 Document Reviewed: 12/16/2018 Elsevier Patient Education  2020 [employer].

## 2020-01-23 NOTE — Discharge Summary (Signed)
Physician Discharge Summary  Wayne [name] XIP:[id number] DOB: June 04, 1976 DOA: 01/19/2020  PCP: Wayne Matar, MD  Admit date: 01/19/2020 Discharge date: 01/23/2020  Time spent: 40 minutes  Recommendations for Outpatient Follow-up:  1. Follow outpatient CBC/CMP 2. Follow CDC quarantine guidelines - continue isolation until 02/10/2020 3. Follow outpatient CXR in 3-4 weeks    Discharge Diagnoses:  Active Problems:   COVID-19   Discharge Condition: stable  Diet recommendation: heart healthy  Filed Weights   01/23/20 0606  Weight: 136.1 kg    History of present illness:  [name] a43 y.o.male,with history of obesity, GERD, gout, B12 deficiency, anemia, hypertension presents to the ER with Wayne [name] chief complaint of dyspnea. Patient reports that he has had shortness of breath that is gradually been getting worse over the past several days. It started 5 days ago, but used to be worse at night and better during the day. It started becoming more constant, and then today was constant throughout the day. It is worse with exertion, better with rest. He has associated cold sweats, chills, chest pain, productive cough with green sputum, intense headache, lightheadedness, fatigue, and body aches.Patient has taken Motrin with little to no relief of the symptoms. He reports that he also has nausea, decreased appetite, and has not eaten anything in the last 3 days. He reports that he has only been drinking about half Wayne [name] bottle of water per day. Patient does not smoke or have any known lung disease. Patient reports that on day 3 of his symptoms he decided to go get vaccinated for Covid. His symptoms started while he was at Wayne [name]. Patient has no other complaints at this time  In the ED Temperature 103, pulse 107, blood pressure 144/96, respirate is 16, saturating at 93% CBC and CHEM panel were unremarkable D-dimer is 0.50, fibrinogen is 619, LDH is 221, triglycerides 118,  ferritin 554, CRP 12.9, lactic acid 0.8, pro-Cal less than 0.10 X-ray shows patchy bilateral groundglass opacities and consolidations consistent with multifocal pneumonia Remdesivir and Decadron were started  He was seen for covid 19 pneumonia.  He's improved with steroids and remdesivir.  He's been weaned to RA and did well on RA with ambulation.  He's being discharged on 8/16 in stable condition with plan to complete steroids and continue quarantine.  Hospital Course:  1. Acute hypoxic respiratory failure 2/2 COVID 19 Pneumonia 1. Unvaccinated (received dose #1 after symptoms began) 2. CXR with multifocal pneumonia 3. Satting 92% RA at rest and 90% with ambulation, ok for discharge home with completion of remdesivir - discussed return precautions. 4. Continue steroids and dexamethasone.  Will send home with 5 days of dexamethasone to complete course. 5. Inflammatory markers downtrending. 6. I/O, daily weights 7. Prone as able, OOB, IS, flutter.  COVID-19 Labs  Recent Labs    01/21/20 0500 01/22/20 0430 01/23/20 0513  DDIMER 0.50 0.39  --   FERRITIN 591* 440* 448*  CRP 7.9* 5.9* 4.4*    Lab Results  Component Value Date   SARSCOV2NAA POSITIVE (Wayne [name]) 01/20/2020   2. HTN 1. Continue amlodipine  3. Gout: continue home colchicine and allopurinol  4. Thrombocytopenia: mild, likely 2/2 covid  5. Tachycardia 1. Improved  Procedures:  none  Consultations:  none  Discharge Exam: Vitals:   01/23/20 0606 01/23/20 0821  BP: (!) 142/92 118/76  Pulse: 89 83  Resp: 20 16  Temp: 98.2 F (36.8 C) 98.2 F (36.8 C)  SpO2: 92% (!) 89%   Feels better Eager to  go home  General: No acute distress. Seen ambulating from bathroom to bed without difficulty Cardiovascular: RRR Lungs: unlabored Abdomen: Soft, nontender, nondistended Neurological: Alert and oriented 3. Moves all extremities 4 . Cranial nerves II through XII grossly intact. Skin: Warm and dry. No rashes or  lesions. Extremities: No clubbing or cyanosis. No edema.  Discharge Instructions   Discharge Instructions    Call MD for:  difficulty breathing, headache or visual disturbances   Complete by: As directed    Call MD for:  extreme fatigue   Complete by: As directed    Call MD for:  persistant dizziness or light-headedness   Complete by: As directed    Call MD for:  persistant nausea and vomiting   Complete by: As directed    Call MD for:  redness, tenderness, or signs of infection (pain, swelling, redness, odor or green/yellow discharge around incision site)   Complete by: As directed    Call MD for:  severe uncontrolled pain   Complete by: As directed    Call MD for:  temperature >100.4   Complete by: As directed    Diet - low sodium heart healthy   Complete by: As directed    Discharge instructions   Complete by: As directed    You were seen for covid pneumonia.  You've improved with steroids and remdesivir.  You should continue dexamethasone for the next 5 days.    Please follow up with your PCP via Wayne [name] telemedicine visit.  Please follow up Wayne [name] chest x ray in 3-4 weeks.  Continue to isolate for 21 days since your diagnosis.  You can discontinue quarantine after 9/3 if you continue to improve and you do not require any fever reducing medicines.  Return for new, recurrent, or worsening symptoms.  Please ask your PCP to request records from this hospitalization so they know what was done and what the next steps will be.   Increase activity slowly   Complete by: As directed      Allergies as of 01/23/2020      Reactions   Amoxicillin Other (See Comments)   Blisters on hands and feet Has patient had [name] PCN reaction causing immediate rash, facial/tongue/throat swelling, SOB or lightheadedness with hypotension: no Has patient had [name] PCN reaction causing severe rash involving mucus membranes or skin necrosis: no Has patient had [name] PCN reaction that required hospitalization: no Has  patient had [name] PCN reaction occurring within the last 10 years: no If all of the above answers are "NO", then may proceed with Cephalosporin use.   Doxycycline Other (See Comments)   Blisters on hands and feet   Sulfamethoxazole-trimethoprim Other (See Comments)   Blisters on hands and feet      Medication List    TAKE these medications   allopurinol 300 MG tablet Commonly known as: ZYLOPRIM Take 1 tablet (300 mg total) by mouth daily.   amLODipine 10 MG tablet Commonly known as: NORVASC Take 1 tablet (10 mg total) by mouth daily.   aspirin EC 81 MG tablet Take 1 tablet (81 mg total) by mouth 2 (two) times daily.   calcium-vitamin D 500-200 MG-UNIT tablet Commonly known as: OSCAL WITH D Take 1 tablet by mouth 3 (three) times daily.   clindamycin-benzoyl peroxide gel Commonly known as: BenzaClin Apply topically 2 (two) times daily.   colchicine 0.6 MG tablet Commonly known as: Colcrys Take 1 tablet (0.6 mg total) by mouth daily.   cyclobenzaprine 5 MG tablet Commonly known  as: FLEXERIL Take 1 tablet (5 mg total) by mouth at bedtime.   dexamethasone 6 MG tablet Commonly known as: Decadron Take 1 tablet (6 mg total) by mouth daily for 5 days.   furosemide 20 MG tablet Commonly known as: LASIX 1 tab PO daily PRN for lower extremity swelling   multivitamin with minerals Tabs tablet Take 1 tablet by mouth daily.   polyethylene glycol powder 17 GM/SCOOP powder Commonly known as: GLYCOLAX/MIRALAX MIX 17 GRAMS IN LIQUID & DRINK DAILY AS NEEDED FOR CONSTIPATION      Allergies  Allergen Reactions  . Amoxicillin Other (See Comments)    Blisters on hands and feet Has patient had Abdias Hickam PCN reaction causing immediate rash, facial/tongue/throat swelling, SOB or lightheadedness with hypotension: no Has patient had Quamel Fitzmaurice PCN reaction causing severe rash involving mucus membranes or skin necrosis: no Has patient had Ramiya Delahunty PCN reaction that required hospitalization: no Has patient had Kanden Carey  PCN reaction occurring within the last 10 years: no If all of the above answers are "NO", then may proceed with Cephalosporin use.   Marland Kitchen. Doxycycline Other (See Comments)    Blisters on hands and feet  . Sulfamethoxazole-Trimethoprim Other (See Comments)    Blisters on hands and feet    Follow-up Information    Wayne MatarJohnson, Deborah B, MD Follow up.   Specialty: Internal Medicine Contact information: [address] [place] [address] [phone]                The results of significant diagnostics from this hospitalization (including imaging, microbiology, ancillary and laboratory) are listed below for reference.    Significant Diagnostic Studies: DG Chest 2 View  Result Date: 01/19/2020 CLINICAL DATA:  Shortness of breath COVID positive EXAM: CHEST - 2 VIEW COMPARISON:  06/03/2018 FINDINGS: Possible small right pleural effusion. Patchy bilateral ground-glass opacities and consolidations. Borderline cardiomegaly.  No pneumothorax. IMPRESSION: Patchy bilateral ground-glass opacities and consolidations, consistent with multifocal pneumonia. Electronically Signed   By: Jasmine PangKim  Fujinaga M.D.   On: 01/19/2020 19:54    Microbiology: Recent Results (from the past 240 hour(s))  Blood Culture (routine x 2)     Status: None (Preliminary result)   Collection Time: 01/19/20  8:36 PM   Specimen: BLOOD  Result Value Ref Range Status   Specimen Description   Final    BLOOD RIGHT ANTECUBITAL Performed at [facility], [address] [place], [place] [address]    Special Requests   Final    BOTTLES DRAWN AEROBIC AND ANAEROBIC Blood Culture adequate volume Performed at [facility], [address] [place], [place] [address]    Culture   Final    NO GROWTH 4 DAYS Performed at [facility], [address] [place], [place] [address]    Report Status PENDING  Incomplete  Blood Culture (routine x 2)     Status: None (Preliminary result)    Collection Time: 01/20/20 12:30 AM   Specimen: BLOOD  Result Value Ref Range Status   Specimen Description   Final    BLOOD LEFT ANTECUBITAL Performed at [facility], [address] [place], [place] [address]    Special Requests   Final    BOTTLES DRAWN AEROBIC AND ANAEROBIC Blood Culture results may not be optimal due to an excessive volume of blood received in culture bottles Performed at [facility], [address] [place], [place] [address]    Culture   Final  NO GROWTH 3 DAYS Performed at [facility], [address], [place], [place] [address]    Report Status PENDING  Incomplete  SARS Coronavirus 2 by RT PCR (hospital order, performed in [facility] hospital lab) Nasopharyngeal Nasopharyngeal Swab     Status: Abnormal   Collection Time: 01/20/20  3:59 AM   Specimen: Nasopharyngeal Swab  Result Value Ref Range Status   SARS Coronavirus 2 POSITIVE (Vito Beg) NEGATIVE Final    Comment: RESULT CALLED TO, READ BACK BY AND VERIFIED WITH: QUICK,M. RN AT 4854 01/20/20 MULLINS,T (NOTE) SARS-CoV-2 target nucleic acids are DETECTED  SARS-CoV-2 RNA is generally detectable in upper respiratory specimens  during the acute phase of infection.  Positive results are indicative  of the presence of the identified virus, but do not rule out bacterial infection or co-infection with other pathogens not detected by the test.  Clinical correlation with patient history and  other diagnostic information is necessary to determine patient infection status.  The expected result is negative.  Fact Sheet for Patients:   BoilerBrush.com.cy   Fact Sheet for Healthcare Providers:   https://pope.com/    This test is not yet approved or cleared by the [place] FDA and  has been authorized for detection and/or diagnosis of SARS-CoV-2 by FDA under an Emergency Use Authorization (EUA).  This EUA will remain in  effect (meaning thi s test can be used) for the duration of  the COVID-19 declaration under Section 564(b)(1) of the Act, 21 U.S.C. section 360-bbb-3(b)(1), unless the authorization is terminated or revoked sooner.  Performed at [facility], [address],  [place], [place] [address]      Labs: Basic Metabolic Panel: Recent Labs  Lab 01/19/20 2050 01/20/20 0608 01/21/20 0500 01/22/20 0430 01/23/20 0511  NA 133* 136 131* 136 137  K 3.8 3.8 4.0 3.9 4.3  CL 99 100 99 102 101  CO2 24 25 24 25 24   GLUCOSE 103* 108* 128* 97 93  BUN 13 16 17 16 15   CREATININE 0.98 0.99 0.78 0.82 0.87  CALCIUM 8.7* 8.6* 8.3* 8.5* 8.7*  MG  --  2.5* 2.5* 2.6* 2.5*  PHOS  --   --  3.9 4.1  --    Liver Function Tests: Recent Labs  Lab 01/19/20 2050 01/20/20 0608 01/21/20 0500 01/22/20 0430 01/23/20 0511  AST 43* 39 38 30 46*  ALT 30 25 27 25 28   ALKPHOS 39 36* 34* 38 36*  BILITOT 1.3* 1.5* 1.0 1.0 1.2  PROT 8.2* 7.2 7.1 7.4 7.7  ALBUMIN 4.1 3.7 3.4* 3.5 3.7   No results for input(s): LIPASE, AMYLASE in the last 168 hours. No results for input(s): AMMONIA in the last 168 hours. CBC: Recent Labs  Lab 01/19/20 2050 01/20/20 0608 01/21/20 0500 01/22/20 0430 01/23/20 0511  WBC 4.5 4.2 4.4 5.3 4.1  NEUTROABS 3.1 3.1 3.4 3.5 2.1  HGB 13.7 12.3* 13.0 13.4 13.4  HCT 41.8 37.5* 38.7* 41.3 40.7  MCV 93.1 93.3 91.9 93.2 92.5  PLT 141* 131* 154 184 204   Cardiac Enzymes: No results for input(s): CKTOTAL, CKMB, CKMBINDEX, TROPONINI in the last 168 hours. BNP: BNP (last 3 results) Recent Labs    01/21/20 0500 01/22/20 0400  BNP 30.2 32.8    ProBNP (last 3 results) No results for input(s): PROBNP in the last 8760 hours.  CBG: Recent Labs  Lab 01/21/20 2130  GLUCAP 96       Signed:  01/24/20 MD.  [facility] 01/23/2020, 10:59 AM

## 2020-01-23 NOTE — Plan of Care (Signed)

## 2020-01-23 NOTE — Progress Notes (Signed)
Discharge instructions reviewed. Patient verbalized understanding. This RN instructed patient to continue isolation per protocol. IV discontinued. Patient escorted to private vehicle by nursing staff.

## 2020-01-23 NOTE — Progress Notes (Signed)
SATURATION QUALIFICATIONS: (This note is used to comply with regulatory documentation for home oxygen)  Patient Saturations on Room Air at Rest = 92%  Patient Saturations on Room Air while Ambulating = 90%   
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
## 2020-01-24 ENCOUNTER — Telehealth: Payer: Self-pay

## 2020-01-24 LAB — CULTURE, BLOOD (ROUTINE X 2)
Culture: NO GROWTH
Special Requests: ADEQUATE

## 2020-01-24 NOTE — Telephone Encounter (Signed)
Transition Care Management Follow-up Telephone Call  Date of discharge and from where: 01/23/2020, [facility]   How have you been since you were released from the hospital? He said that he is feeling " fine."  Reminded him that as per his discharge instructions, he is to continue isolation at home until 02/10/2020 and he said that he understood  Any questions or concerns? none at this time  Items Reviewed:  Did the pt receive and understand the discharge instructions provided?  yes, he did not have any questions about the instructions  Medications obtained and verified?  he said that he has all medications except for the decadron which someone was picking up for him today. He did not have any questions about his med regime,   Any new allergies since your discharge?  none reported   Do you have support at home?  yes.   No home health or DME ordered   Functional Questionnaire: (I = Independent and D = Dependent) ADLs: independent   Follow up appointments reviewed:   PCP Hospital f/u appt confirmed? televisit with Dr Laural Benes 02/03/2020 @ 1050  Specialist Hospital f/u appt confirmed? none at this time  Are transportation arrangements needed? no  If their condition worsens, is the pt aware to call PCP or go to the Emergency Dept?  yes  Was the patient provided with contact information for the PCP's office or ED?  he has the clinic phone number  Was to pt encouraged to call back with questions or concerns? yes

## 2020-01-25 LAB — CULTURE, BLOOD (ROUTINE X 2): Culture: NO GROWTH

## 2020-01-30 MED FILL — AMLODIPINE BESYLATE 10 MG T: 10 | 30 days supply | Qty: 30 | Fill #2

## 2020-01-30 MED FILL — ?ALLOPURINOL 300 MG TABLET: 300 | 30 days supply | Qty: 30 | Fill #1

## 2020-02-03 ENCOUNTER — Ambulatory Visit: Payer: HRSA Program | Attending: Internal Medicine | Admitting: Internal Medicine

## 2020-02-03 ENCOUNTER — Other Ambulatory Visit: Payer: Self-pay

## 2020-02-03 DIAGNOSIS — Z09 Encounter for follow-up examination after completed treatment for conditions other than malignant neoplasm: Secondary | ICD-10-CM

## 2020-02-03 DIAGNOSIS — U071 COVID-19: Secondary | ICD-10-CM | POA: Diagnosis not present

## 2020-02-03 DIAGNOSIS — J1282 Pneumonia due to coronavirus disease 2019: Secondary | ICD-10-CM

## 2020-02-03 NOTE — Progress Notes (Signed)
Virtual Visit via Telephone Note Due to current restrictions/limitations of in-office visits due to the COVID-19 pandemic, this scheduled clinical appointment was converted to a telehealth visit  I connected with Wayne [name] on 02/03/20 at 10:30 a.m EDT by telephone and verified that I am speaking with the correct person using two identifiers. I am in my office.  The patient is at home.  Only the patient and myself participated in this encounter.  I discussed the limitations, risks, security and privacy concerns of performing an evaluation and management service by telephone and the availability of in person appointments. I also discussed with the patient that there may be a patient responsible charge related to this service. The patient expressed understanding and agreed to proceed.   History of Present Illness: Patient with history of gout (dx via jt fluid exam 10+ yrs ago per pt), rectal bleeding due to internal hemorrhoids, HTN, EtOH use disorder, iron deficiency anemia, B12 def, gout.  This is f/u from recent hosp/transition of care  Patient hospitalized: 8/12-16/2021 Date of phone call from case manager: 01/24/2020  Patient hospitalized with cough, shortness of breath and fever.  Found to have COVID-19 pneumonia.  He was requiring oxygen.  He was treated with remdesivir and dexamethasone with good recovery.  He had mild thrombocytopenia that resolved by the time of discharge.  He was discharged home on 5 more days of dexamethasone.  He did not require oxygen at the time of discharge.  Today: Overall patient states he is feeling much better.  SOB has just about resolved. Cough and fever resolved.  Completed the Dexamethasone -pt had mild elevation of AST.  CBC nl at time of dischg -His quarantine will and on September 3. -He is wondering when he should receive the second shot of the COVID-19 vaccine.  He got the shot 3 days into his acute illness that turned out to be Covid  infection. Outpatient Encounter Medications as of 02/03/2020  Medication Sig   allopurinol (ZYLOPRIM) 300 MG tablet Take 1 tablet (300 mg total) by mouth daily.   amLODipine (NORVASC) 10 MG tablet Take 1 tablet (10 mg total) by mouth daily.   aspirin EC 81 MG tablet Take 1 tablet (81 mg total) by mouth 2 (two) times daily. (Patient not taking: Reported on 07/01/2019)   calcium-vitamin D (OSCAL WITH D) 500-200 MG-UNIT tablet Take 1 tablet by mouth 3 (three) times daily. (Patient not taking: Reported on 07/01/2019)   clindamycin-benzoyl peroxide (BENZACLIN) gel Apply topically 2 (two) times daily. (Patient not taking: Reported on 01/19/2020)   colchicine (COLCRYS) 0.6 MG tablet Take 1 tablet (0.6 mg total) by mouth daily.   cyclobenzaprine (FLEXERIL) 5 MG tablet Take 1 tablet (5 mg total) by mouth at bedtime. (Patient not taking: Reported on 01/19/2020)   furosemide (LASIX) 20 MG tablet 1 tab PO daily PRN for lower extremity swelling (Patient not taking: Reported on 07/01/2019)   Multiple Vitamin (MULTIVITAMIN WITH MINERALS) TABS tablet Take 1 tablet by mouth daily.   polyethylene glycol powder (GLYCOLAX/MIRALAX) 17 GM/SCOOP powder MIX 17 GRAMS IN LIQUID & DRINK DAILY AS NEEDED FOR CONSTIPATION (Patient not taking: Reported on 01/19/2020)   No facility-administered encounter medications on file as of 02/03/2020.      Observations/Objective: Lab Results  Component Value Date   WBC 4.1 01/23/2020   HGB 13.4 01/23/2020   HCT 40.7 01/23/2020   MCV 92.5 01/23/2020   PLT 204 01/23/2020     Chemistry      Component Value  Date/Time   NA 137 01/23/2020 0511   NA 145 (H) 11/24/2019 1535   K 4.3 01/23/2020 0511   CL 101 01/23/2020 0511   CO2 24 01/23/2020 0511   BUN 15 01/23/2020 0511   BUN 13 11/24/2019 1535   CREATININE 0.87 01/23/2020 0511      Component Value Date/Time   CALCIUM 8.7 (L) 01/23/2020 0511   ALKPHOS 36 (L) 01/23/2020 0511   AST 46 (H) 01/23/2020 0511   ALT 28  01/23/2020 0511   BILITOT 1.2 01/23/2020 0511   BILITOT 0.4 11/24/2019 1535       Assessment and Plan: 1. Hospital discharge follow-up 2. Pneumonia due to COVID-19 virus By history, patient has significantly improved.  He will finish out his quarantine the early part of next month.  Advised that once he is out of quarantine he should continue to practice wearing mask and social distancing when in public and in crowds.  I will find out from one of our ID specialist when would it be advisable for him to get the second shot of his COVID-19 vaccine.   Follow Up Instructions: 1 mth  I discussed the assessment and treatment plan with the patient. The patient was provided an opportunity to ask questions and all were answered. The patient agreed with the plan and demonstrated an understanding of the instructions.   The patient was advised to call back or seek an in-person evaluation if the symptoms worsen or if the condition fails to improve as anticipated.  I provided 9 minutes of non-face-to-face time during this encounter.   [name], MD

## 2020-03-01 ENCOUNTER — Other Ambulatory Visit: Payer: Self-pay | Admitting: Family

## 2020-03-01 ENCOUNTER — Other Ambulatory Visit: Payer: Self-pay | Admitting: Internal Medicine

## 2020-03-01 DIAGNOSIS — I10 Essential (primary) hypertension: Secondary | ICD-10-CM

## 2020-03-01 DIAGNOSIS — M109 Gout, unspecified: Secondary | ICD-10-CM

## 2020-03-01 MED ORDER — AMLODIPINE BESYLATE 10 MG PO TABS: 10.0000 mg | ORAL_TABLET | Freq: Every day | ORAL | 0 refills | Status: DC

## 2020-03-01 MED ORDER — ALLOPURINOL 300 MG PO TABS: 300.0000 mg | ORAL_TABLET | Freq: Every day | ORAL | 2 refills | Status: DC

## 2020-03-01 MED FILL — ?ALLOPURINOL 300 MG TABLET: 300 | 30 days supply | Qty: 30 | Fill #0

## 2020-03-01 MED FILL — AMLODIPINE BESYLATE 10 MG T: 10 | 30 days supply | Qty: 30 | Fill #0

## 2020-03-01 NOTE — Telephone Encounter (Signed)
Medication Refill - Medication: allopurinol (ZYLOPRIM) 300 MG tablet amLODipine (NORVASC) 10 MG tablet     Preferred Pharmacy (with phone number or street name):  [facility] [place], [place] - [address] Phone:  [phone]  Fax:  [phone]       Agent: Please be advised that RX refills may take up to 3 business days. We ask that you follow-up with your pharmacy.

## 2020-03-01 NOTE — Telephone Encounter (Signed)
Requested Prescriptions  Pending Prescriptions Disp Refills  . allopurinol (ZYLOPRIM) 300 MG tablet 30 tablet 2    Sig: Take 1 tablet (300 mg total) by mouth daily.     Endocrinology:  Gout Agents Failed - 03/01/2020  8:45 AM      Failed - Uric Acid in normal range and within 360 days    Uric Acid  Date Value Ref Range Status  01/12/2018 5.1 3.7 - 8.6 mg/dL Final    Comment:               Therapeutic target for gout patients: <6.0         Passed - Cr in normal range and within 360 days    Creatinine, Ser  Date Value Ref Range Status  01/23/2020 0.87 0.61 - 1.24 mg/dL Final         Passed - Valid encounter within last 12 months    Recent Outpatient Visits          3 weeks ago Pneumonia due to COVID-19 virus   [facility] Marcine Matar, MD   3 months ago Essential hypertension   [place] [place], [place] J, NP   6 months ago Chronic gout of right knee, unspecified cause   [facility] [place], [place], NP   8 months ago Gout flare   [place] [place], [place], NP   1 year ago Essential hypertension   [place] [facility] Marcine Matar, MD      Future Appointments            In 2 weeks Marcine Matar, MD [facility]           . amLODipine (NORVASC) 10 MG tablet 90 tablet 0    Sig: Take 1 tablet (10 mg total) by mouth daily.     Cardiovascular:  Calcium Channel Blockers Passed - 03/01/2020  8:45 AM      Passed - Last BP in normal range    BP Readings from Last 1 Encounters:  01/23/20 118/76         Passed - Valid encounter within last 6 months    Recent Outpatient Visits          3 weeks ago Pneumonia due to COVID-19 virus   [facility] Marcine Matar, MD   3 months ago Essential hypertension   [facility] [place], [place]  J, NP   6 months ago Chronic gout of right knee, unspecified cause   [facility] [place], [place], NP   8 months ago Gout flare   [facility] [place], [place], NP   1 year ago Essential hypertension   [place] [facility] Marcine Matar, MD      Future Appointments            In 2 weeks Marcine Matar, MD [facility]

## 2020-03-15 ENCOUNTER — Other Ambulatory Visit: Payer: Self-pay

## 2020-03-15 ENCOUNTER — Encounter: Payer: Self-pay | Admitting: Internal Medicine

## 2020-03-15 ENCOUNTER — Ambulatory Visit: Payer: Self-pay | Attending: Internal Medicine | Admitting: Internal Medicine

## 2020-03-15 ENCOUNTER — Ambulatory Visit (HOSPITAL_BASED_OUTPATIENT_CLINIC_OR_DEPARTMENT_OTHER): Payer: Self-pay | Admitting: Pharmacist

## 2020-03-15 ENCOUNTER — Encounter: Payer: Self-pay | Admitting: Pharmacist

## 2020-03-15 VITALS — BP 140/80 | HR 76 | Resp 16 | Wt 319.2 lb

## 2020-03-15 DIAGNOSIS — I10 Essential (primary) hypertension: Secondary | ICD-10-CM

## 2020-03-15 DIAGNOSIS — E538 Deficiency of other specified B group vitamins: Secondary | ICD-10-CM

## 2020-03-15 DIAGNOSIS — Z23 Encounter for immunization: Secondary | ICD-10-CM

## 2020-03-15 MED ORDER — LOSARTAN POTASSIUM 25 MG PO TABS: 25.0000 mg | ORAL_TABLET | Freq: Every day | ORAL | 2 refills | Status: DC

## 2020-03-15 NOTE — Patient Instructions (Signed)
Your blood pressure is not at goal.  Continue amlodipine.  We have added another blood pressure medication called Cozaar 25 mg once a day. Continue to limit salt in the foods. I have referred you to a nutritionist. You should get the second Covid vaccine the end of this month.  After you have completed the series please call and let me know the type of vaccine you got and the dates so that I can update your health maintenance.

## 2020-03-15 NOTE — Progress Notes (Signed)
Patient presents for vaccination against influenza per orders of Dr. Johnson. Consent given. Counseling provided. No contraindications exists. Vaccine administered without incident.  ° °Luke Van Ausdall, PharmD, CPP °Clinical Pharmacist °[facility] °[phone] ° °
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
## 2020-03-16 LAB — VITAMIN B12: Vitamin B-12: 414 pg/mL (ref 232–1245)

## 2020-03-16 IMAGING — CR DG FOOT COMPLETE 3+V*L*
3 series · 3 of 3 positions shown · non-contrast
Comparison: None.

CLINICAL DATA: Acute onset left foot and ankle pain when the
patient felt a pop while playing with his children today. Initial
encounter.

EXAM:
LEFT FOOT - COMPLETE 3+ VIEW

[x foot lat left]
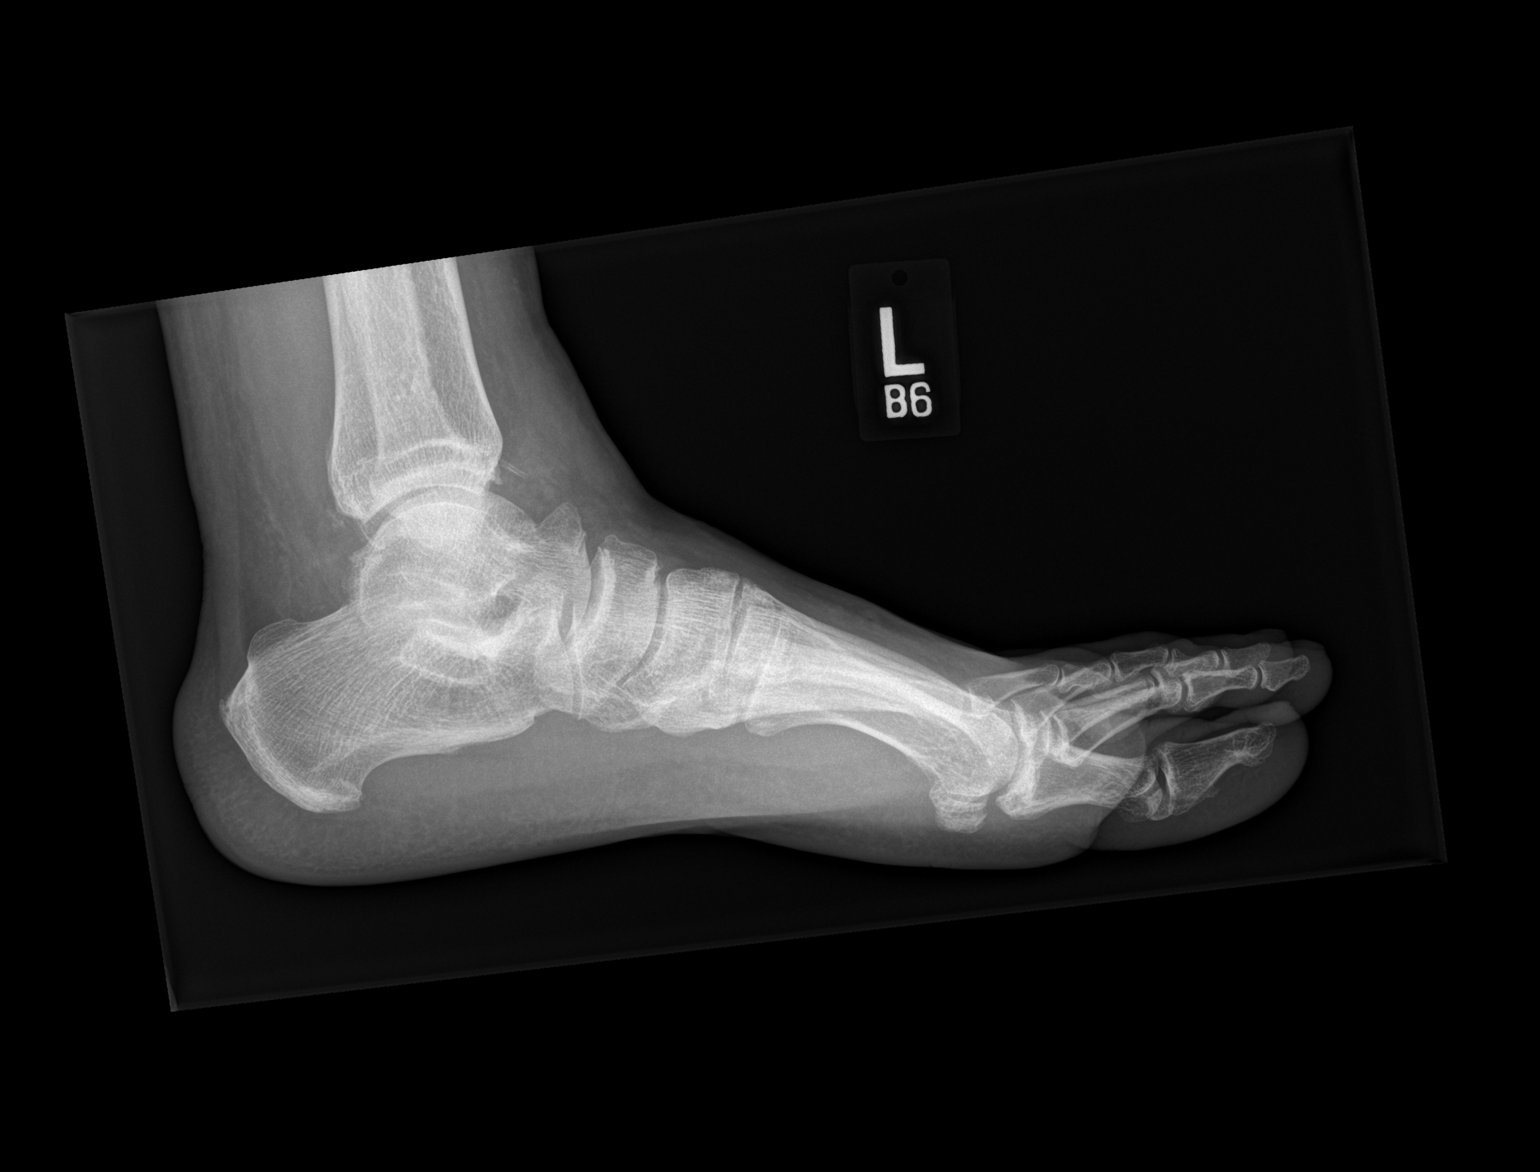

[x foot ap left]
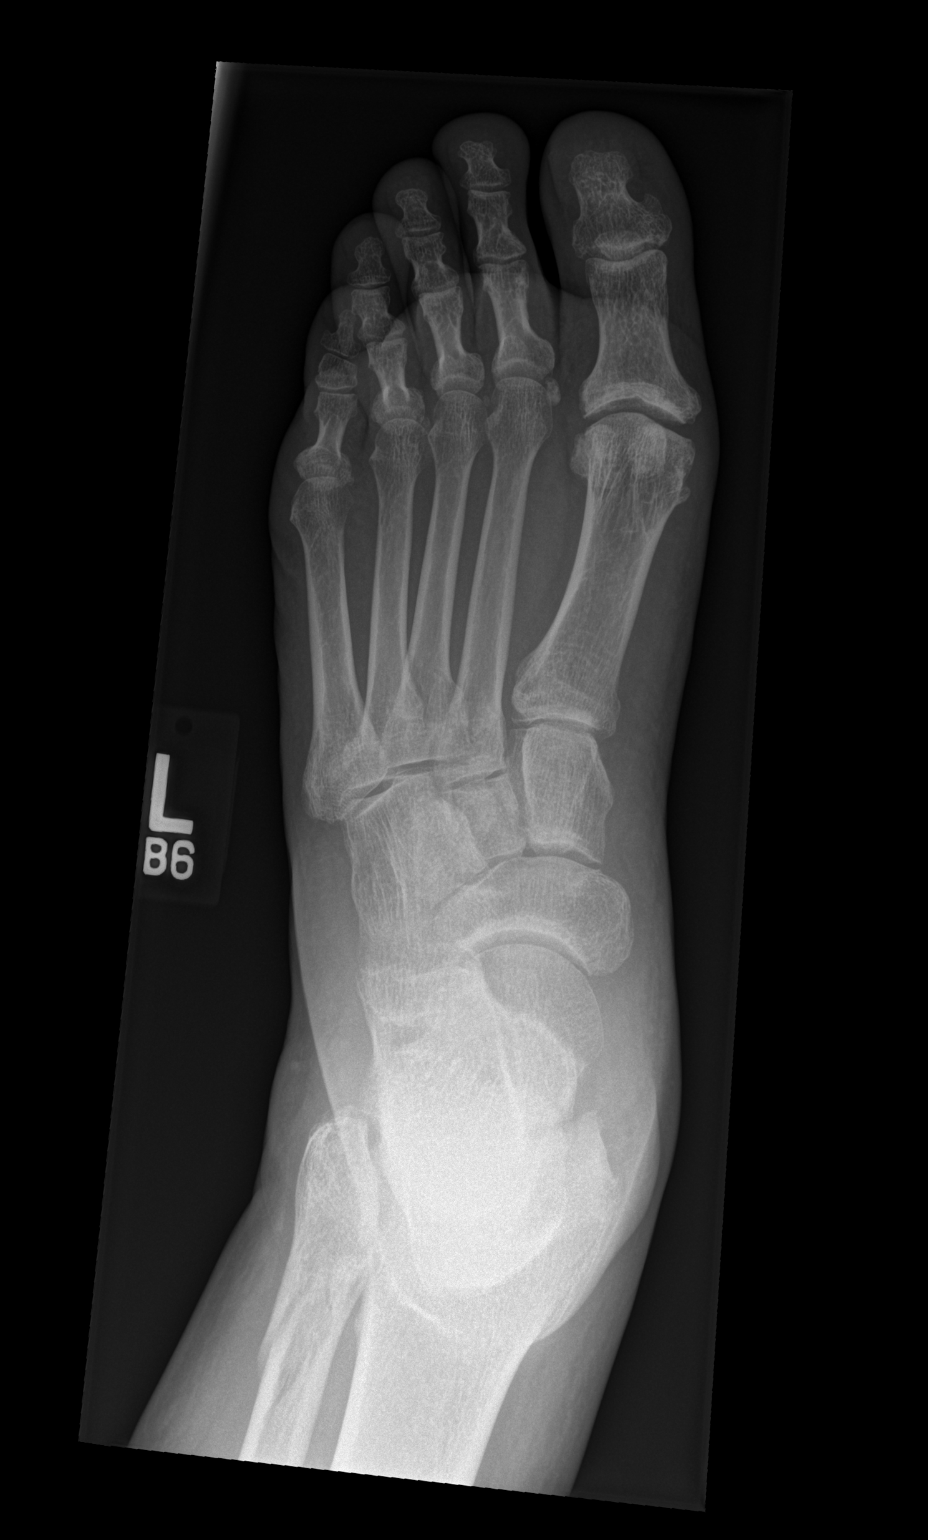

[x foot obl left]
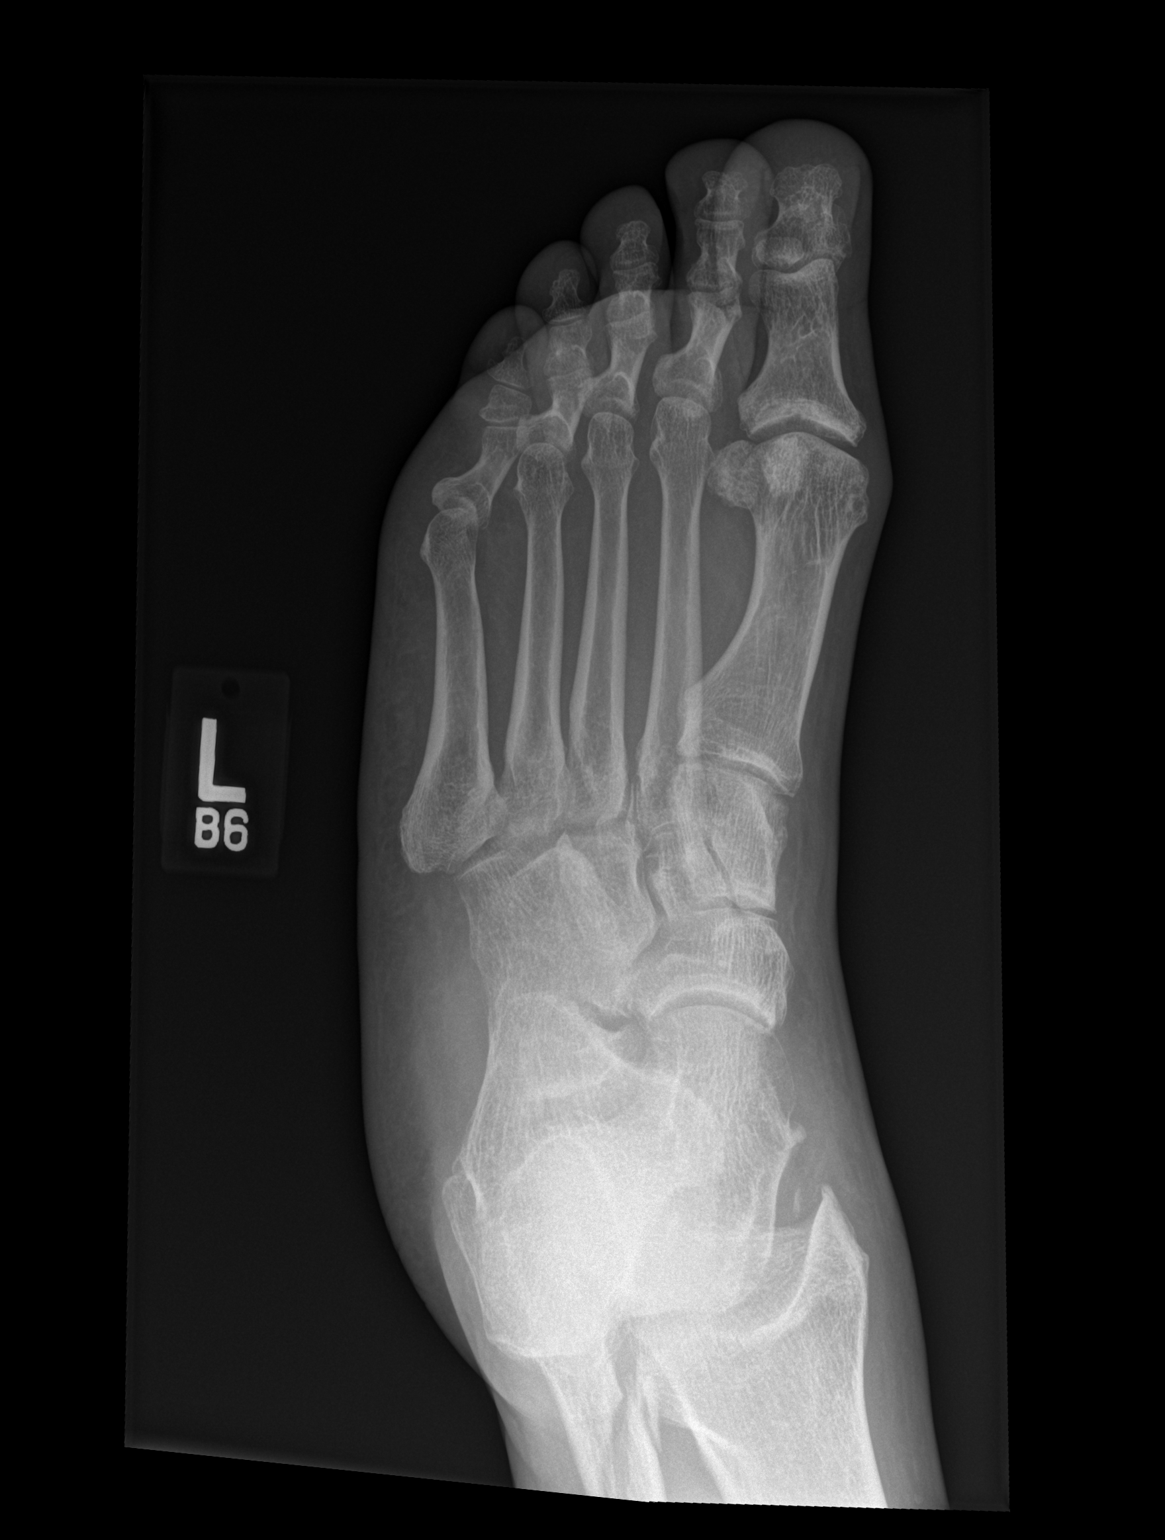

[3 of 3 positions shown; findings below may reference images not displayed]

FINDINGS: Distal fibular fracture is noted and described on dedicated plain
films of the ankle this same day. No other acute bony or joint
abnormality is seen. Exostosis off the dorsal neck of the talus is
noted.
IMPRESSION: Acute distal fibular fracture. No other acute abnormality is
identified.

## 2020-04-02 MED FILL — ALLOPURINOL 300 MG TAB: 300 | 30 days supply | Qty: 30 | Fill #1

## 2020-04-02 MED FILL — AMLODIPINE BESYLATE 10 MG T: 10 | 30 days supply | Qty: 30 | Fill #1

## 2020-04-05 ENCOUNTER — Encounter: Payer: Self-pay | Admitting: Dietician

## 2020-04-05 ENCOUNTER — Encounter: Payer: Self-pay | Attending: Dietician | Admitting: Dietician

## 2020-04-05 DIAGNOSIS — E669 Obesity, unspecified: Secondary | ICD-10-CM | POA: Insufficient documentation

## 2020-04-05 NOTE — Progress Notes (Signed)
Medical Nutrition Therapy   Primary concerns today: weight management  Referral diagnosis: E66.01- morbid obesity Preferred learning style: no preference indicated Learning readiness: contemplating  MyChart Visit This visit was completed via MyChart.   I spoke with Wayne [name] and verified that I was speaking with the correct person with two patient identifiers (full name and date of birth).   I discussed the limitations related to this kind of visit and the patient is willing to proceed.   NUTRITION ASSESSMENT   Clinical Medical Hx: hemorrhoids, HTN, respiratory failure, GI bleeding, gout, IDA, obesity, B12 deficiency, alcohol abuse Medications: see list  Lifestyle & Dietary Hx Patient states he has had lots of surgeries over the past few years, and has gained weight each time. States he has been able to lose weight in the past by fasting for a day or so at a time. States he has gout and usually avoids red meat because of it. If doesn't cook, states he may make Hot Pockets for dinner. Typical meal pattern is 1 meal per day (dinner) plus dessert in the evening.   Estimated daily fluid intake: 64+ oz Supplements: fiber Current average weekly physical activity: limited due to gym being closed   24-Hr Dietary Recall First Meal: -  Snack: -  Second Meal: -  Snack: -  Third Meal: baked chicken + corn + green beans Snack: ice cream  Beverages: water    NUTRITION DIAGNOSIS  Overweight/obesity ([place]-3.3) related to physical inactivity as evidenced by recent weight gain, referral or obesity, and patient reported lack of physical activity.    NUTRITION INTERVENTION  Nutrition education (E-1) on the following topics:  . General balanced eating   Handouts Provided Include   MyPlate & Meal Ideas  Breakfast Ideas   Balanced Snacks (provided to patient via email)  Learning Style & Readiness for Change Teaching method utilized: Visual & Auditory  Demonstrated degree of  understanding via: Teach Back  Barriers to learning/adherence to lifestyle change: Contemplative Stage of Change    MONITORING & EVALUATION Dietary intake, weekly physical activity, and goals PRN.  Next Steps  Patient is to contact [facility] for follow up as needed.

## 2020-04-18 MED FILL — ?COLCHINCINE 0.6 MG TABS: 0.6 | 15 days supply | Qty: 15 | Fill #6

## 2020-04-30 ENCOUNTER — Inpatient Hospital Stay (HOSPITAL_COMMUNITY)
Admission: EM | Admit: 2020-04-30 | Discharge: 2020-05-04 | DRG: 208 | Disposition: A | Discharge status: Home / Self Care | Payer: Self-pay | Attending: Family Medicine | Admitting: Family Medicine

## 2020-04-30 ENCOUNTER — Other Ambulatory Visit (HOSPITAL_COMMUNITY): Payer: Self-pay

## 2020-04-30 ENCOUNTER — Inpatient Hospital Stay (HOSPITAL_COMMUNITY): Payer: Self-pay

## 2020-04-30 ENCOUNTER — Emergency Department (HOSPITAL_COMMUNITY): Payer: Self-pay

## 2020-04-30 DIAGNOSIS — R404 Transient alteration of awareness: Secondary | ICD-10-CM | POA: Diagnosis present

## 2020-04-30 DIAGNOSIS — I10 Essential (primary) hypertension: Secondary | ICD-10-CM | POA: Diagnosis present

## 2020-04-30 DIAGNOSIS — E8770 Fluid overload, unspecified: Secondary | ICD-10-CM | POA: Diagnosis present

## 2020-04-30 DIAGNOSIS — T380X5A Adverse effect of glucocorticoids and synthetic analogues, initial encounter: Secondary | ICD-10-CM | POA: Diagnosis not present

## 2020-04-30 DIAGNOSIS — J80 Acute respiratory distress syndrome: Principal | ICD-10-CM | POA: Diagnosis present

## 2020-04-30 DIAGNOSIS — Z9911 Dependence on respirator [ventilator] status: Secondary | ICD-10-CM

## 2020-04-30 DIAGNOSIS — M10061 Idiopathic gout, right knee: Secondary | ICD-10-CM | POA: Diagnosis present

## 2020-04-30 DIAGNOSIS — Z6841 Body Mass Index (BMI) 40.0 and over, adult: Secondary | ICD-10-CM

## 2020-04-30 DIAGNOSIS — R0902 Hypoxemia: Secondary | ICD-10-CM

## 2020-04-30 DIAGNOSIS — Z882 Allergy status to sulfonamides status: Secondary | ICD-10-CM

## 2020-04-30 DIAGNOSIS — Z781 Physical restraint status: Secondary | ICD-10-CM

## 2020-04-30 DIAGNOSIS — I4891 Unspecified atrial fibrillation: Secondary | ICD-10-CM | POA: Diagnosis present

## 2020-04-30 DIAGNOSIS — R739 Hyperglycemia, unspecified: Secondary | ICD-10-CM | POA: Diagnosis not present

## 2020-04-30 DIAGNOSIS — K648 Other hemorrhoids: Secondary | ICD-10-CM | POA: Diagnosis present

## 2020-04-30 DIAGNOSIS — E538 Deficiency of other specified B group vitamins: Secondary | ICD-10-CM | POA: Diagnosis present

## 2020-04-30 DIAGNOSIS — Z20822 Contact with and (suspected) exposure to covid-19: Secondary | ICD-10-CM | POA: Diagnosis present

## 2020-04-30 DIAGNOSIS — R451 Restlessness and agitation: Secondary | ICD-10-CM | POA: Diagnosis present

## 2020-04-30 DIAGNOSIS — Z452 Encounter for adjustment and management of vascular access device: Secondary | ICD-10-CM

## 2020-04-30 DIAGNOSIS — R402432 Glasgow coma scale score 3-8, at arrival to emergency department: Secondary | ICD-10-CM

## 2020-04-30 DIAGNOSIS — I471 Supraventricular tachycardia: Secondary | ICD-10-CM | POA: Diagnosis present

## 2020-04-30 DIAGNOSIS — F1123 Opioid dependence with withdrawal: Secondary | ICD-10-CM | POA: Diagnosis present

## 2020-04-30 DIAGNOSIS — J810 Acute pulmonary edema: Secondary | ICD-10-CM | POA: Diagnosis present

## 2020-04-30 DIAGNOSIS — G8929 Other chronic pain: Secondary | ICD-10-CM | POA: Diagnosis present

## 2020-04-30 DIAGNOSIS — K219 Gastro-esophageal reflux disease without esophagitis: Secondary | ICD-10-CM | POA: Diagnosis present

## 2020-04-30 DIAGNOSIS — Z23 Encounter for immunization: Secondary | ICD-10-CM

## 2020-04-30 DIAGNOSIS — G4733 Obstructive sleep apnea (adult) (pediatric): Secondary | ICD-10-CM | POA: Diagnosis present

## 2020-04-30 DIAGNOSIS — I161 Hypertensive emergency: Secondary | ICD-10-CM | POA: Diagnosis present

## 2020-04-30 DIAGNOSIS — Z79899 Other long term (current) drug therapy: Secondary | ICD-10-CM

## 2020-04-30 DIAGNOSIS — N179 Acute kidney failure, unspecified: Secondary | ICD-10-CM | POA: Diagnosis present

## 2020-04-30 DIAGNOSIS — J9601 Acute respiratory failure with hypoxia: Secondary | ICD-10-CM

## 2020-04-30 DIAGNOSIS — J9602 Acute respiratory failure with hypercapnia: Secondary | ICD-10-CM

## 2020-04-30 DIAGNOSIS — F1010 Alcohol abuse, uncomplicated: Secondary | ICD-10-CM | POA: Diagnosis present

## 2020-04-30 DIAGNOSIS — E872 Acidosis: Secondary | ICD-10-CM | POA: Diagnosis present

## 2020-04-30 DIAGNOSIS — R40243 Glasgow coma scale score 3-8, unspecified time: Secondary | ICD-10-CM | POA: Insufficient documentation

## 2020-04-30 DIAGNOSIS — D509 Iron deficiency anemia, unspecified: Secondary | ICD-10-CM | POA: Diagnosis present

## 2020-04-30 DIAGNOSIS — Z8616 Personal history of COVID-19: Secondary | ICD-10-CM

## 2020-04-30 DIAGNOSIS — K5900 Constipation, unspecified: Secondary | ICD-10-CM | POA: Diagnosis not present

## 2020-04-30 DIAGNOSIS — Z7982 Long term (current) use of aspirin: Secondary | ICD-10-CM

## 2020-04-30 DIAGNOSIS — Z881 Allergy status to other antibiotic agents status: Secondary | ICD-10-CM

## 2020-04-30 HISTORY — DX: Acute respiratory distress syndrome: J80

## 2020-04-30 LAB — POCT I-STAT 7, (LYTES, BLD GAS, ICA,H+H)
Acid-base deficit: 3 mmol/L — ABNORMAL HIGH (ref 0.0–2.0)
Bicarbonate: 24.2 mmol/L (ref 20.0–28.0)
Calcium, Ion: 1.26 mmol/L (ref 1.15–1.40)
HCT: 45 % (ref 39.0–52.0)
Hemoglobin: 15.3 g/dL (ref 13.0–17.0)
O2 Saturation: 91 %
Patient temperature: 101.5
Potassium: 4.2 mmol/L (ref 3.5–5.1)
Sodium: 143 mmol/L (ref 135–145)
TCO2: 26 mmol/L (ref 22–32)
pCO2 arterial: 55.3 mmHg — ABNORMAL HIGH (ref 32.0–48.0)
pH, Arterial: 7.256 — ABNORMAL LOW (ref 7.350–7.450)
pO2, Arterial: 77 mmHg — ABNORMAL LOW (ref 83.0–108.0)

## 2020-04-30 LAB — I-STAT ARTERIAL BLOOD GAS, ED
Acid-base deficit: 3 mmol/L — ABNORMAL HIGH (ref 0.0–2.0)
Bicarbonate: 28.9 mmol/L — ABNORMAL HIGH (ref 20.0–28.0)
Calcium, Ion: 1.28 mmol/L (ref 1.15–1.40)
HCT: 47 % (ref 39.0–52.0)
Hemoglobin: 16 g/dL (ref 13.0–17.0)
O2 Saturation: 85 %
Patient temperature: 98.9
Potassium: 4.8 mmol/L (ref 3.5–5.1)
Sodium: 142 mmol/L (ref 135–145)
TCO2: 31 mmol/L (ref 22–32)
pCO2 arterial: 85.3 mmHg (ref 32.0–48.0)
pH, Arterial: 7.139 — CL (ref 7.350–7.450)
pO2, Arterial: 69 mmHg — ABNORMAL LOW (ref 83.0–108.0)

## 2020-04-30 LAB — CREATININE, SERUM
Creatinine, Ser: 1.35 mg/dL — ABNORMAL HIGH (ref 0.61–1.24)
GFR, Estimated: >60 mL/min (ref >60)

## 2020-04-30 LAB — CBC
HCT: 46.2 % (ref 39.0–52.0)
Hemoglobin: 14.2 g/dL (ref 13.0–17.0)
MCH: 29.6 pg (ref 26.0–34.0)
MCHC: 30.7 g/dL (ref 30.0–36.0)
MCV: 96.5 fL (ref 80.0–100.0)
Platelets: 193 10*3/uL (ref 150–400)
RBC: 4.79 MIL/uL (ref 4.22–5.81)
RDW: 11.9 % (ref 11.5–15.5)
WBC: 4.1 10*3/uL (ref 4.0–10.5)

## 2020-04-30 LAB — COMPREHENSIVE METABOLIC PANEL
ALT: 21 U/L (ref 0–44)
AST: 23 U/L (ref 15–41)
Albumin: 1.9 g/dL — ABNORMAL LOW (ref 3.5–5.0)
Alkaline Phosphatase: 32 U/L — ABNORMAL LOW (ref 38–126)
Anion gap: 8 (ref 5–15)
BUN: 12 mg/dL (ref 6–20)
CO2: 16 mmol/L — ABNORMAL LOW (ref 22–32)
Calcium: 4.7 mg/dL — CL (ref 8.9–10.3)
Chloride: 122 mmol/L — ABNORMAL HIGH (ref 98–111)
Creatinine, Ser: 0.66 mg/dL (ref 0.61–1.24)
GFR, Estimated: >60 mL/min (ref >60)
Glucose, Bld: 186 mg/dL — ABNORMAL HIGH (ref 70–99)
Potassium: 2.5 mmol/L — CL (ref 3.5–5.1)
Sodium: 146 mmol/L — ABNORMAL HIGH (ref 135–145)
Total Bilirubin: 0.3 mg/dL (ref 0.3–1.2)
Total Protein: 3.6 g/dL — ABNORMAL LOW (ref 6.5–8.1)

## 2020-04-30 LAB — CBC WITH DIFFERENTIAL/PLATELET
Abs Immature Granulocytes: 0.05 10*3/uL (ref 0.00–0.07)
Basophils Absolute: 0 10*3/uL (ref 0.0–0.1)
Basophils Relative: 0 %
Eosinophils Absolute: 0.2 10*3/uL (ref 0.0–0.5)
Eosinophils Relative: 5 %
HCT: 29.5 % — ABNORMAL LOW (ref 39.0–52.0)
Hemoglobin: 8.7 g/dL — ABNORMAL LOW (ref 13.0–17.0)
Immature Granulocytes: 1 %
Lymphocytes Relative: 59 %
Lymphs Abs: 2.5 10*3/uL (ref 0.7–4.0)
MCH: 30 pg (ref 26.0–34.0)
MCHC: 29.5 g/dL — ABNORMAL LOW (ref 30.0–36.0)
MCV: 101.7 fL — ABNORMAL HIGH (ref 80.0–100.0)
Monocytes Absolute: 0.3 10*3/uL (ref 0.1–1.0)
Monocytes Relative: 8 %
Neutro Abs: 1.1 10*3/uL — ABNORMAL LOW (ref 1.7–7.7)
Neutrophils Relative %: 27 %
Platelets: 140 10*3/uL — ABNORMAL LOW (ref 150–400)
RBC: 2.9 MIL/uL — ABNORMAL LOW (ref 4.22–5.81)
RDW: 11.8 % (ref 11.5–15.5)
WBC: 4.1 10*3/uL (ref 4.0–10.5)
nRBC: 0 % (ref 0.0–0.2)

## 2020-04-30 LAB — RAPID URINE DRUG SCREEN, HOSP PERFORMED
Amphetamines: NOT DETECTED
Barbiturates: NOT DETECTED
Benzodiazepines: NOT DETECTED
Cocaine: NOT DETECTED
Opiates: NOT DETECTED
Tetrahydrocannabinol: NOT DETECTED

## 2020-04-30 LAB — BASIC METABOLIC PANEL
Anion gap: 11 (ref 5–15)
BUN: 19 mg/dL (ref 6–20)
CO2: 24 mmol/L (ref 22–32)
Calcium: 8.4 mg/dL — ABNORMAL LOW (ref 8.9–10.3)
Chloride: 106 mmol/L (ref 98–111)
Creatinine, Ser: 1.15 mg/dL (ref 0.61–1.24)
GFR, Estimated: >60 mL/min (ref >60)
Glucose, Bld: 171 mg/dL — ABNORMAL HIGH (ref 70–99)
Potassium: 4.8 mmol/L (ref 3.5–5.1)
Sodium: 141 mmol/L (ref 135–145)

## 2020-04-30 LAB — ACETAMINOPHEN LEVEL: Acetaminophen (Tylenol), Serum: <10 ug/mL — ABNORMAL LOW (ref 10–30)

## 2020-04-30 LAB — PHOSPHORUS
Phosphorus: 4.7 mg/dL — ABNORMAL HIGH (ref 2.5–4.6)
Phosphorus: 3 mg/dL (ref 2.5–4.6)

## 2020-04-30 LAB — RESPIRATORY PANEL BY RT PCR (FLU A&B, COVID)
Influenza A by PCR: NEGATIVE
Influenza B by PCR: NEGATIVE
SARS Coronavirus 2 by RT PCR: NEGATIVE

## 2020-04-30 LAB — TROPONIN I (HIGH SENSITIVITY)
Troponin I (High Sensitivity): 19 ng/L — ABNORMAL HIGH (ref <18)
Troponin I (High Sensitivity): 220 ng/L (ref <18)

## 2020-04-30 LAB — ETHANOL: Alcohol, Ethyl (B): <10 mg/dL (ref <10)

## 2020-04-30 LAB — PROCALCITONIN: Procalcitonin: 0.44 ng/mL

## 2020-04-30 LAB — GLUCOSE, CAPILLARY
Glucose-Capillary: 137 mg/dL — ABNORMAL HIGH (ref 70–99)
Glucose-Capillary: 144 mg/dL — ABNORMAL HIGH (ref 70–99)
Glucose-Capillary: 177 mg/dL — ABNORMAL HIGH (ref 70–99)
Glucose-Capillary: 182 mg/dL — ABNORMAL HIGH (ref 70–99)
Glucose-Capillary: 202 mg/dL — ABNORMAL HIGH (ref 70–99)

## 2020-04-30 LAB — STREP PNEUMONIAE URINARY ANTIGEN: Strep Pneumo Urinary Antigen: NEGATIVE

## 2020-04-30 LAB — LACTIC ACID, PLASMA
Lactic Acid, Venous: 2.2 mmol/L (ref 0.5–1.9)
Lactic Acid, Venous: 2 mmol/L (ref 0.5–1.9)

## 2020-04-30 LAB — TRIGLYCERIDES: Triglycerides: 115 mg/dL (ref <150)

## 2020-04-30 LAB — HEMOGLOBIN A1C
Hgb A1c MFr Bld: 5.4 % (ref 4.8–5.6)
Mean Plasma Glucose: 108.28 mg/dL

## 2020-04-30 LAB — PROTIME-INR
INR: 1.2 (ref 0.8–1.2)
Prothrombin Time: 14.7 seconds (ref 11.4–15.2)

## 2020-04-30 LAB — MRSA PCR SCREENING: MRSA by PCR: NEGATIVE

## 2020-04-30 LAB — MAGNESIUM
Magnesium: 2 mg/dL (ref 1.7–2.4)
Magnesium: 1.6 mg/dL — ABNORMAL LOW (ref 1.7–2.4)

## 2020-04-30 LAB — CBG MONITORING, ED: Glucose-Capillary: 275 mg/dL — ABNORMAL HIGH (ref 70–99)

## 2020-04-30 LAB — SALICYLATE LEVEL: Salicylate Lvl: <7 mg/dL — ABNORMAL LOW (ref 7.0–30.0)

## 2020-04-30 LAB — AMMONIA: Ammonia: 45 umol/L — ABNORMAL HIGH (ref 9–35)

## 2020-04-30 LAB — BRAIN NATRIURETIC PEPTIDE: B Natriuretic Peptide: 13.2 pg/mL (ref 0.0–100.0)

## 2020-04-30 MED ORDER — ACETAMINOPHEN 325 MG PO TABS: 650.0000 mg | ORAL_TABLET | Freq: Four times a day (QID) | ORAL | Status: DC | PRN

## 2020-04-30 MED ORDER — FENTANYL 2500MCG IN NS 250ML (10MCG/ML) PREMIX INFUSION
50.0000 ug/h | INTRAVENOUS | Status: DC
  Administered 2020-04-30: 400 ug/h via INTRAVENOUS
  Administered 2020-04-30: 100 ug/h via INTRAVENOUS
  Administered 2020-04-30 – 2020-05-02 (×7): 400 ug/h via INTRAVENOUS
  Filled 2020-04-30 (×10): qty 250

## 2020-04-30 MED ORDER — SODIUM CHLORIDE 0.9 % IV SOLN
2.0000 g | INTRAVENOUS | Status: AC
  Administered 2020-04-30 – 2020-05-02 (×3): 2 g via INTRAVENOUS
  Filled 2020-04-30 (×3): qty 20

## 2020-04-30 MED ORDER — VITAL HIGH PROTEIN PO LIQD: 1000.0000 mL | ORAL | Status: DC

## 2020-04-30 MED ORDER — ARTIFICIAL TEARS OPHTHALMIC OINT
1.0000 "application " | TOPICAL_OINTMENT | Freq: Three times a day (TID) | OPHTHALMIC | Status: DC
  Administered 2020-04-30 – 2020-05-01 (×5): 1 via OPHTHALMIC
  Filled 2020-04-30: qty 3.5

## 2020-04-30 MED ORDER — MAGNESIUM SULFATE 2 GM/50ML IV SOLN
2.0000 g | Freq: Once | INTRAVENOUS | Status: AC
  Administered 2020-04-30: 2 g via INTRAVENOUS
  Filled 2020-04-30: qty 50

## 2020-04-30 MED ORDER — FENTANYL BOLUS VIA INFUSION
50.0000 ug | INTRAVENOUS | Status: DC | PRN
  Administered 2020-04-30: 75 ug via INTRAVENOUS
  Administered 2020-04-30 (×2): 100 ug via INTRAVENOUS
  Administered 2020-04-30: 50 ug via INTRAVENOUS
  Administered 2020-04-30 – 2020-05-02 (×12): 100 ug via INTRAVENOUS
  Filled 2020-04-30: qty 100

## 2020-04-30 MED ORDER — DOCUSATE SODIUM 100 MG PO CAPS: 100.0000 mg | ORAL_CAPSULE | Freq: Two times a day (BID) | ORAL | Status: DC | PRN

## 2020-04-30 MED ORDER — QUETIAPINE FUMARATE 25 MG PO TABS: 25.0000 mg | ORAL_TABLET | Freq: Two times a day (BID) | ORAL | Status: DC

## 2020-04-30 MED ORDER — PROSOURCE TF PO LIQD
45.0000 mL | Freq: Four times a day (QID) | ORAL | Status: DC
  Administered 2020-04-30 – 2020-05-01 (×8): 45 mL
  Filled 2020-04-30 (×8): qty 45

## 2020-04-30 MED ORDER — SODIUM CHLORIDE 0.9 % IV SOLN
250.0000 mL | INTRAVENOUS | Status: DC
  Administered 2020-04-30: 250 mL via INTRAVENOUS

## 2020-04-30 MED ORDER — PROPOFOL 1000 MG/100ML IV EMUL
5.0000 ug/kg/min | INTRAVENOUS | Status: DC
  Administered 2020-04-30: 80 ug/kg/min via INTRAVENOUS
  Administered 2020-04-30 (×2): 40 ug/kg/min via INTRAVENOUS
  Administered 2020-04-30: 80 ug/kg/min via INTRAVENOUS
  Administered 2020-04-30: 70 ug/kg/min via INTRAVENOUS
  Administered 2020-04-30: 60 ug/kg/min via INTRAVENOUS
  Administered 2020-05-01 (×6): 80 ug/kg/min via INTRAVENOUS
  Filled 2020-04-30: qty 200
  Filled 2020-04-30 (×4): qty 100
  Filled 2020-04-30 (×2): qty 200
  Filled 2020-04-30 (×4): qty 100

## 2020-04-30 MED ORDER — VITAL HIGH PROTEIN PO LIQD
1000.0000 mL | ORAL | Status: DC
  Administered 2020-04-30: 1000 mL
  Filled 2020-04-30 (×2): qty 1000

## 2020-04-30 MED ORDER — INSULIN ASPART 100 UNIT/ML ~~LOC~~ SOLN
0.0000 [IU] | SUBCUTANEOUS | Status: DC
  Administered 2020-04-30: 3 [IU] via SUBCUTANEOUS
  Administered 2020-04-30 (×2): 4 [IU] via SUBCUTANEOUS
  Administered 2020-04-30: 7 [IU] via SUBCUTANEOUS
  Administered 2020-05-01: 11 [IU] via SUBCUTANEOUS
  Administered 2020-05-01 (×2): 7 [IU] via SUBCUTANEOUS
  Administered 2020-05-01 – 2020-05-02 (×4): 11 [IU] via SUBCUTANEOUS
  Administered 2020-05-02: 3 [IU] via SUBCUTANEOUS
  Administered 2020-05-02: 4 [IU] via SUBCUTANEOUS
  Administered 2020-05-02: 11 [IU] via SUBCUTANEOUS
  Administered 2020-05-02 – 2020-05-03 (×2): 3 [IU] via SUBCUTANEOUS
  Administered 2020-05-03 (×2): 4 [IU] via SUBCUTANEOUS

## 2020-04-30 MED ORDER — PANTOPRAZOLE SODIUM 40 MG IV SOLR
40.0000 mg | Freq: Every day | INTRAVENOUS | Status: DC
  Administered 2020-04-30: 40 mg via INTRAVENOUS
  Filled 2020-04-30: qty 40

## 2020-04-30 MED ORDER — CHLORHEXIDINE GLUCONATE 0.12% ORAL RINSE (MEDLINE KIT)
15.0000 mL | Freq: Two times a day (BID) | OROMUCOSAL | Status: DC
  Administered 2020-04-30 – 2020-05-03 (×6): 15 mL via OROMUCOSAL

## 2020-04-30 MED ORDER — COLCHICINE 0.6 MG PO TABS
0.6000 mg | ORAL_TABLET | Freq: Every day | ORAL | Status: DC
  Administered 2020-04-30 – 2020-05-01 (×2): 0.6 mg
  Filled 2020-04-30 (×3): qty 1

## 2020-04-30 MED ORDER — SODIUM CHLORIDE 0.9 % IV SOLN
500.0000 mg | INTRAVENOUS | Status: AC
  Administered 2020-04-30 – 2020-05-02 (×3): 500 mg via INTRAVENOUS
  Filled 2020-04-30 (×3): qty 500

## 2020-04-30 MED ORDER — FENTANYL CITRATE (PF) 100 MCG/2ML IJ SOLN
INTRAMUSCULAR | Status: AC
  Administered 2020-04-30: 100 ug via INTRAVENOUS
  Filled 2020-04-30: qty 2

## 2020-04-30 MED ORDER — POLYETHYLENE GLYCOL 3350 17 G PO PACK: 17.0000 g | PACK | Freq: Every day | ORAL | Status: DC | PRN

## 2020-04-30 MED ORDER — ENOXAPARIN SODIUM 80 MG/0.8ML ~~LOC~~ SOLN
70.0000 mg | Freq: Every day | SUBCUTANEOUS | Status: DC
  Administered 2020-04-30 – 2020-05-04 (×5): 70 mg via SUBCUTANEOUS
  Filled 2020-04-30 (×4): qty 0.7
  Filled 2020-04-30: qty 0.8

## 2020-04-30 MED ORDER — DOCUSATE SODIUM 50 MG/5ML PO LIQD: 100.0000 mg | Freq: Two times a day (BID) | ORAL | Status: DC | PRN

## 2020-04-30 MED ORDER — MIDAZOLAM HCL 2 MG/2ML IJ SOLN: 6.0000 mg | Freq: Once | INTRAMUSCULAR | Status: DC

## 2020-04-30 MED ORDER — MIDAZOLAM HCL 2 MG/2ML IJ SOLN
INTRAMUSCULAR | Status: AC
  Filled 2020-04-30: qty 6

## 2020-04-30 MED ORDER — NOREPINEPHRINE 4 MG/250ML-% IV SOLN
2.0000 ug/min | INTRAVENOUS | Status: DC
  Administered 2020-04-30: 2 ug/min via INTRAVENOUS

## 2020-04-30 MED ORDER — ACETAMINOPHEN 160 MG/5ML PO SOLN
650.0000 mg | Freq: Four times a day (QID) | ORAL | Status: DC | PRN
  Administered 2020-04-30 – 2020-05-01 (×3): 650 mg
  Filled 2020-04-30 (×3): qty 20.3

## 2020-04-30 MED ORDER — FUROSEMIDE 10 MG/ML IJ SOLN
40.0000 mg | Freq: Four times a day (QID) | INTRAMUSCULAR | Status: AC
  Administered 2020-04-30: 40 mg via INTRAVENOUS
  Filled 2020-04-30: qty 4

## 2020-04-30 MED ORDER — FENTANYL CITRATE (PF) 100 MCG/2ML IJ SOLN: 50.0000 ug | Freq: Once | INTRAMUSCULAR | Status: AC

## 2020-04-30 MED ORDER — LORAZEPAM 2 MG/ML IJ SOLN
INTRAMUSCULAR | Status: AC
  Filled 2020-04-30: qty 1

## 2020-04-30 MED ORDER — ASPIRIN 81 MG PO CHEW
81.0000 mg | CHEWABLE_TABLET | Freq: Every day | ORAL | Status: DC
  Administered 2020-04-30 – 2020-05-01 (×2): 81 mg
  Filled 2020-04-30 (×3): qty 1

## 2020-04-30 MED ORDER — SUCCINYLCHOLINE CHLORIDE 20 MG/ML IJ SOLN
120.0000 mg | Freq: Once | INTRAMUSCULAR | Status: AC
  Administered 2020-04-30: 120 mg via INTRAVENOUS

## 2020-04-30 MED ORDER — FENTANYL BOLUS VIA INFUSION
50.0000 ug | INTRAVENOUS | Status: DC | PRN
  Administered 2020-04-30: 50 ug via INTRAVENOUS
  Filled 2020-04-30: qty 50

## 2020-04-30 MED ORDER — METHYLPREDNISOLONE SODIUM SUCC 125 MG IJ SOLR
60.0000 mg | Freq: Two times a day (BID) | INTRAMUSCULAR | Status: DC
  Administered 2020-04-30 (×2): 60 mg via INTRAVENOUS
  Filled 2020-04-30 (×3): qty 2

## 2020-04-30 MED ORDER — IPRATROPIUM-ALBUTEROL 0.5-2.5 (3) MG/3ML IN SOLN: 3.0000 mL | RESPIRATORY_TRACT | Status: DC | PRN

## 2020-04-30 MED ORDER — MIDAZOLAM HCL 2 MG/2ML IJ SOLN
6.0000 mg | Freq: Once | INTRAMUSCULAR | Status: AC
  Administered 2020-04-30: 6 mg via INTRAVENOUS

## 2020-04-30 MED ORDER — MIDAZOLAM 50MG/50ML (1MG/ML) PREMIX INFUSION
0.5000 mg/h | INTRAVENOUS | Status: DC
Start: 2020-04-30 — End: 2020-04-30

## 2020-04-30 MED ORDER — FUROSEMIDE 10 MG/ML IJ SOLN: 40.0000 mg | Freq: Four times a day (QID) | INTRAMUSCULAR | Status: DC

## 2020-04-30 MED ORDER — ORAL CARE MOUTH RINSE
15.0000 mL | OROMUCOSAL | Status: DC
  Administered 2020-04-30 – 2020-05-02 (×20): 15 mL via OROMUCOSAL

## 2020-04-30 MED ORDER — CHLORHEXIDINE GLUCONATE CLOTH 2 % EX PADS
6.0000 | MEDICATED_PAD | Freq: Every day | CUTANEOUS | Status: DC
  Administered 2020-05-01 – 2020-05-04 (×5): 6 via TOPICAL

## 2020-04-30 MED ORDER — PROPOFOL 1000 MG/100ML IV EMUL
INTRAVENOUS | Status: AC
  Filled 2020-04-30: qty 100

## 2020-04-30 MED ORDER — VECURONIUM BROMIDE 10 MG IV SOLR
10.0000 mg | INTRAVENOUS | Status: DC | PRN
  Administered 2020-04-30 (×5): 10 mg via INTRAVENOUS
  Filled 2020-04-30 (×5): qty 10

## 2020-04-30 MED ORDER — NOREPINEPHRINE 4 MG/250ML-% IV SOLN
INTRAVENOUS | Status: AC
  Filled 2020-04-30: qty 250

## 2020-04-30 MED ORDER — NOREPINEPHRINE 4 MG/250ML-% IV SOLN
0.0000 ug/min | INTRAVENOUS | Status: DC
  Administered 2020-04-30: 4 ug/min via INTRAVENOUS
  Administered 2020-04-30: 12 ug/min via INTRAVENOUS
  Administered 2020-05-01: 6 ug/min via INTRAVENOUS
  Administered 2020-05-01: 5 ug/min via INTRAVENOUS
  Filled 2020-04-30 (×3): qty 250

## 2020-04-30 MED ORDER — NICARDIPINE HCL IN NACL 20-0.86 MG/200ML-% IV SOLN: 3.0000 mg/h | INTRAVENOUS | Status: DC

## 2020-04-30 MED ORDER — ROCURONIUM BROMIDE 50 MG/5ML IV SOLN
140.0000 mg | Freq: Once | INTRAVENOUS | Status: AC
  Administered 2020-04-30: 140 mg via INTRAVENOUS
  Filled 2020-04-30: qty 14

## 2020-04-30 MED ORDER — ALLOPURINOL 100 MG PO TABS
300.0000 mg | ORAL_TABLET | Freq: Every day | ORAL | Status: DC
  Administered 2020-04-30 – 2020-05-01 (×2): 300 mg
  Filled 2020-04-30 (×3): qty 3
  Filled 2020-04-30: qty 1

## 2020-04-30 MED ORDER — VITAL HIGH PROTEIN PO LIQD
1000.0000 mL | ORAL | Status: DC
  Filled 2020-04-30: qty 1000

## 2020-04-30 MED ORDER — ETOMIDATE 2 MG/ML IV SOLN
40.0000 mg | Freq: Once | INTRAVENOUS | Status: AC
  Administered 2020-04-30: 40 mg via INTRAVENOUS

## 2020-04-30 MED ORDER — PROSOURCE TF PO LIQD
45.0000 mL | Freq: Two times a day (BID) | ORAL | Status: DC
  Filled 2020-04-30: qty 45

## 2020-04-30 MED ORDER — FUROSEMIDE 10 MG/ML IJ SOLN: 40.0000 mg | Freq: Once | INTRAMUSCULAR | Status: DC

## 2020-04-30 MED ORDER — ENOXAPARIN SODIUM 40 MG/0.4ML ~~LOC~~ SOLN: 40.0000 mg | Freq: Every day | SUBCUTANEOUS | Status: DC

## 2020-04-30 NOTE — Progress Notes (Signed)
Sputum collected and sent to lab 

## 2020-04-30 NOTE — Procedures (Signed)
Arterial Catheter Insertion Procedure Note  Wayne [name]  [id number]  12/29/1975  Date:04/30/20  Time:10:51 AM    Provider Performing: Lorin Glass    Procedure: Insertion of Arterial Line (25956) with US guidance (38756)   Indication(s) Blood pressure monitoring and/or need for frequent ABGs  Consent Unable to obtain consent due to emergent nature of procedure.  Anesthesia None   Time Out Verified patient identification, verified procedure, site/side was marked, verified correct patient position, special equipment/implants available, medications/allergies/relevant history reviewed, required imaging and test results available.   Sterile Technique Maximal sterile technique including full sterile barrier drape, hand hygiene, sterile gown, sterile gloves, mask, hair covering, sterile ultrasound probe cover (if used).   Procedure Description Area of catheter insertion was cleaned with chlorhexidine and draped in sterile fashion. With real-time ultrasound guidance an arterial catheter was placed into the left radial artery.  Appropriate arterial tracings confirmed on monitor.     Complications/Tolerance None; patient tolerated the procedure well.   EBL Minimal   Specimen(s) None

## 2020-04-30 NOTE — Progress Notes (Signed)
Pt prone. On initial assessment while attempting to oral and airway suction pt, pt roused and began to attempt to rise from bed (in the form of a pushup)  Attempted to verbally calm pt and requested help from RN.  RN provided pt sedation medication.  I expressed my concern for the pt's ongoing sedation plan based on events reported by previous RT.

## 2020-04-30 NOTE — Progress Notes (Signed)
Initial Nutrition Assessment  DOCUMENTATION CODES:   Morbid obesity  INTERVENTION:   Tube Feeding via OG: Vital High Protein at 60 ml/hr Pro-Source TF 45 mL QID Provides 171 g of protein, 1600 kcals and 1210 mL of free water Meets 100% protein needs  TF regimen and propofol at current rate providing 2046 total kcal/day (100% calorie needs)   NUTRITION DIAGNOSIS:   Inadequate oral intake related to acute illness as evidenced by NPO status.  GOAL:   Patient will meet greater than or equal to 90% of their needs  MONITOR:   TF tolerance, Weight trends, Vent status, Labs  REASON FOR ASSESSMENT:   Consult, Ventilator Enteral/tube feeding initiation and management  ASSESSMENT:   44 yo male admitted with AMS and acute respiratory failure/ARDS requiring intubation. Pt with recent COVID pneumonia (Aug 2021) requiring hospital stay. PMH includes EtOH abuse, B12 deficiency, HTN, iron def anemia  Patient is currently intubated on ventilator support, sedated, requiring levophed MV: 16.8 L/min Temp (24hrs), Avg:98 F (36.7 C), Min:97.1 F (36.2 C), Max:98.9 F (37.2 C)  Propofol: 16.9 ml/hr  OG tube with tip in peripyloric region, likely within proximal duodenum  Current wt 144 kg; no wt loss per weight encounters. Unable to obtain diet and weight history from patient at this time  Labs: sodium 142 (wdl), potassium 4.8 (wdl), Creatinine wdl, ionized calcium 1.28 (wdl) Meds: lasix, ss novolog, solumedrol    Diet Order:   Diet Order            Diet NPO time specified  Diet effective now                 EDUCATION NEEDS:   Not appropriate for education at this time  Skin:  Skin Assessment: Reviewed RN Assessment  Last BM:  PTA  Height:   Ht Readings from Last 1 Encounters:  04/30/20 6' (1.829 m)    Weight:   Wt Readings from Last 1 Encounters:  04/30/20 (!) 144 kg    Ideal Body Weight:  81 kg  BMI:  Body mass index is 43.06 kg/m.  Estimated  Nutritional Needs:   Kcal:  7867-5449 kcals  Protein:  165-200 g  Fluid:  >/= 2 L    Romelle Starcher MS, RDN, LDN, CNSC Registered Dietitian III Clinical Nutrition RD Pager and On-Call Pager Number Located in [place]

## 2020-04-30 NOTE — ED Notes (Signed)
Pt in svt, pt synchronized shock at 120 joules per dr. Elesa Massed.
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
## 2020-04-30 NOTE — ED Notes (Addendum)
RT at bedside, MD notified of decreased saturation.

## 2020-04-30 NOTE — Procedures (Signed)
Called to bedside for inability to ventilate.  Also some issues with getting sedation due to central line positioning.  Both were fixed, former with bronchoscope, advanced into airway and ETT advanced over bronch to about 4cm above carina.  Did BAL while there in [place] with return of bloody cloudy fluid.  Erskine Emery MD PCCM

## 2020-04-30 NOTE — Progress Notes (Signed)
RT called to room due to pt waking up and trying to move head. MD called due to pt losing volumes on vent. MD bronched pt and advanced ETT to 26cm. ETT was secured with tube holder. Pt has good volume return on vent. Vitals were stable throughout procedure.

## 2020-04-30 NOTE — Progress Notes (Signed)
Pt transported on the ventilator from ED RESUS to 2M09. RT, RN x2 and EMT accompanied pt during transport. VSS throughout. Report given to ICU RT. RT will continue to monitor.

## 2020-04-30 NOTE — Progress Notes (Signed)
Pt placed in prone position with RNx's2 and MD. Head turned to the left. ETT is secured with cloth tape. Pt tolerated well.

## 2020-04-30 NOTE — Procedures (Signed)
Central Venous Catheter Insertion Procedure Note Wayne [name] [id number] [birth year]  Procedure: Insertion of Central Venous Catheter Indications: Drug and/or fluid administration  Procedure Details Consent: Unable to obtain consent because of emergent medical necessity. Time Out: Verified patient identification, verified procedure, site/side was marked, verified correct patient position, special equipment/implants available, medications/allergies/relevent history reviewed, required imaging and test results available.  Performed  Maximum sterile technique was used including antiseptics, cap, gloves, gown, hand hygiene, mask and sheet. Skin prep: Chlorhexidine; local anesthetic administered [name] antimicrobial bonded/coated triple lumen catheter was placed in the right internal jugular vein using the Seldinger technique. Catheter placed to 15 cm. Blood aspirated via all 3 ports and then flushed x 3. Line sutured x 3 and dressing applied.  Ultrasound guidance used.Yes.    Evaluation Blood flow good Complications: No apparent complications Patient did tolerate procedure well. Chest X-ray ordered to verify placement.  CXR: normal.  Wayne Spike A, DO 04/30/2020, 10:47 AM Pager: [phone]

## 2020-04-30 NOTE — Progress Notes (Signed)
Pt transported to CT without event. 
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
## 2020-04-30 NOTE — Procedures (Signed)
Arterial Catheter Insertion Procedure Note Wayne [name] [id number] 09-11-1975  Procedure: Insertion of Arterial Catheter  Indications: Blood pressure monitoring and Frequent blood sampling  Procedure Details Consent: Unable to obtain consent because of emergent medical necessity. Time Out: Verified patient identification, verified procedure, site/side was marked, verified correct patient position, special equipment/implants available, medications/allergies/relevent history reviewed, required imaging and test results available.  Performed  Maximum sterile technique was used including antiseptics, gloves and hand hygiene. Skin prep: Chlorhexidine; local anesthetic administered 20 gauge catheter was inserted into left radial artery using the Seldinger technique.  Evaluation Blood flow good; BP tracing good. Complications: No apparent complications.   Wayne [name] 04/30/2020

## 2020-04-30 NOTE — Progress Notes (Signed)
iNO to be held at this time per Dr. Katrinka Blazing. Will re-evaluate when pt gets to the ICU. RT will continue to monitor.

## 2020-04-30 NOTE — ED Triage Notes (Signed)
Pt arrives from home via [facility]. Per EMS, pt wife found him slumped over in bed. No PMH. Pupils were constricted, RR was 4. He was given 2 narcan IM, 2 narcan IV into 22 in the left hand. Left shoulder I/O established. Pt being assisted via BVM on arrival. No gag reflex, no responsiveness. EDP in room on arrival, preparing for intubation. NPA x2, cbg 224, breath sounds clear, hr 160's, 148 palpated BP.

## 2020-05-01 ENCOUNTER — Inpatient Hospital Stay (HOSPITAL_COMMUNITY): Payer: Self-pay

## 2020-05-01 DIAGNOSIS — R9431 Abnormal electrocardiogram [ECG] [EKG]: Secondary | ICD-10-CM

## 2020-05-01 LAB — GLUCOSE, CAPILLARY
Glucose-Capillary: 209 mg/dL — ABNORMAL HIGH (ref 70–99)
Glucose-Capillary: 269 mg/dL — ABNORMAL HIGH (ref 70–99)
Glucose-Capillary: 299 mg/dL — ABNORMAL HIGH (ref 70–99)
Glucose-Capillary: 281 mg/dL — ABNORMAL HIGH (ref 70–99)
Glucose-Capillary: 253 mg/dL — ABNORMAL HIGH (ref 70–99)
Glucose-Capillary: 235 mg/dL — ABNORMAL HIGH (ref 70–99)

## 2020-05-01 LAB — CBC
HCT: 38.4 % — ABNORMAL LOW (ref 39.0–52.0)
Hemoglobin: 11.9 g/dL — ABNORMAL LOW (ref 13.0–17.0)
MCH: 30.4 pg (ref 26.0–34.0)
MCHC: 31 g/dL (ref 30.0–36.0)
MCV: 98.2 fL (ref 80.0–100.0)
Platelets: 181 10*3/uL (ref 150–400)
RBC: 3.91 MIL/uL — ABNORMAL LOW (ref 4.22–5.81)
RDW: 11.9 % (ref 11.5–15.5)
WBC: 9.2 10*3/uL (ref 4.0–10.5)
nRBC: 0 % (ref 0.0–0.2)

## 2020-05-01 LAB — BASIC METABOLIC PANEL
Anion gap: 10 (ref 5–15)
BUN: 20 mg/dL (ref 6–20)
CO2: 20 mmol/L — ABNORMAL LOW (ref 22–32)
Calcium: 8.5 mg/dL — ABNORMAL LOW (ref 8.9–10.3)
Chloride: 108 mmol/L (ref 98–111)
Creatinine, Ser: 1.1 mg/dL (ref 0.61–1.24)
GFR, Estimated: >60 mL/min (ref >60)
Glucose, Bld: 280 mg/dL — ABNORMAL HIGH (ref 70–99)
Potassium: 4.4 mmol/L (ref 3.5–5.1)
Sodium: 138 mmol/L (ref 135–145)

## 2020-05-01 LAB — LEGIONELLA PNEUMOPHILA SEROGP 1 UR AG: L. pneumophila Serogp 1 Ur Ag: NEGATIVE

## 2020-05-01 LAB — MAGNESIUM: Magnesium: 2.4 mg/dL (ref 1.7–2.4)

## 2020-05-01 LAB — ECHOCARDIOGRAM COMPLETE
Area-P 1/2: 3.42 cm2
Calc EF: 54.6 %
Height: 72 in
S' Lateral: 3.8 cm
Single Plane A2C EF: 61.6 %
Single Plane A4C EF: 47.8 %
Weight: 5361.59 oz

## 2020-05-01 LAB — PHOSPHORUS: Phosphorus: 2.6 mg/dL (ref 2.5–4.6)

## 2020-05-01 LAB — POCT I-STAT 7, (LYTES, BLD GAS, ICA,H+H)
Acid-base deficit: 2 mmol/L (ref 0.0–2.0)
Bicarbonate: 23.7 mmol/L (ref 20.0–28.0)
Calcium, Ion: 1.21 mmol/L (ref 1.15–1.40)
HCT: 36 % — ABNORMAL LOW (ref 39.0–52.0)
Hemoglobin: 12.2 g/dL — ABNORMAL LOW (ref 13.0–17.0)
O2 Saturation: 100 %
Patient temperature: 98.4
Potassium: 4.4 mmol/L (ref 3.5–5.1)
Sodium: 141 mmol/L (ref 135–145)
TCO2: 25 mmol/L (ref 22–32)
pCO2 arterial: 43.9 mmHg (ref 32.0–48.0)
pH, Arterial: 7.34 — ABNORMAL LOW (ref 7.350–7.450)
pO2, Arterial: 318 mmHg — ABNORMAL HIGH (ref 83.0–108.0)

## 2020-05-01 MED ORDER — MIDAZOLAM HCL 2 MG/2ML IJ SOLN: 2.0000 mg | INTRAMUSCULAR | Status: DC | PRN

## 2020-05-01 MED ORDER — METHYLPREDNISOLONE SODIUM SUCC 125 MG IJ SOLR
60.0000 mg | INTRAMUSCULAR | Status: DC
  Administered 2020-05-01 – 2020-05-02 (×3): 60 mg via INTRAVENOUS
  Filled 2020-05-01 (×2): qty 2

## 2020-05-01 MED ORDER — PROPOFOL 1000 MG/100ML IV EMUL
5.0000 ug/kg/min | INTRAVENOUS | Status: DC
  Administered 2020-05-01 (×9): 70 ug/kg/min via INTRAVENOUS
  Administered 2020-05-01 – 2020-05-02 (×7): 80 ug/kg/min via INTRAVENOUS
  Filled 2020-05-01 (×4): qty 200
  Filled 2020-05-01: qty 100
  Filled 2020-05-01 (×4): qty 200

## 2020-05-01 MED ORDER — FUROSEMIDE 10 MG/ML IJ SOLN
40.0000 mg | Freq: Once | INTRAMUSCULAR | Status: AC
  Administered 2020-05-01: 40 mg via INTRAVENOUS
  Filled 2020-05-01: qty 4

## 2020-05-01 MED ORDER — INSULIN DETEMIR 100 UNIT/ML ~~LOC~~ SOLN
10.0000 [IU] | Freq: Two times a day (BID) | SUBCUTANEOUS | Status: DC
  Administered 2020-05-01 (×2): 10 [IU] via SUBCUTANEOUS
  Filled 2020-05-01 (×4): qty 0.1

## 2020-05-01 MED ORDER — DOCUSATE SODIUM 50 MG/5ML PO LIQD
100.0000 mg | Freq: Two times a day (BID) | ORAL | Status: DC
  Administered 2020-05-01 (×2): 100 mg
  Filled 2020-05-01 (×2): qty 10

## 2020-05-01 MED ORDER — SODIUM CHLORIDE 0.9% FLUSH: 10.0000 mL | INTRAVENOUS | Status: DC | PRN

## 2020-05-01 MED ORDER — PANTOPRAZOLE SODIUM 40 MG PO PACK
40.0000 mg | PACK | Freq: Every day | ORAL | Status: DC
  Administered 2020-05-01: 40 mg
  Filled 2020-05-01: qty 20

## 2020-05-01 MED ORDER — INSULIN DETEMIR 100 UNIT/ML ~~LOC~~ SOLN: 14.0000 [IU] | Freq: Two times a day (BID) | SUBCUTANEOUS | Status: DC

## 2020-05-01 MED ORDER — POLYETHYLENE GLYCOL 3350 17 G PO PACK
17.0000 g | PACK | Freq: Every day | ORAL | Status: DC
  Administered 2020-05-01: 17 g
  Filled 2020-05-01 (×2): qty 1

## 2020-05-01 MED ORDER — INSULIN DETEMIR 100 UNIT/ML ~~LOC~~ SOLN
10.0000 [IU] | Freq: Two times a day (BID) | SUBCUTANEOUS | Status: DC
  Filled 2020-05-01 (×2): qty 0.1

## 2020-05-01 MED ORDER — SODIUM CHLORIDE 0.9% FLUSH
10.0000 mL | Freq: Two times a day (BID) | INTRAVENOUS | Status: DC
  Administered 2020-05-01 – 2020-05-04 (×5): 10 mL

## 2020-05-01 MED ORDER — MIDAZOLAM HCL 2 MG/2ML IJ SOLN
2.0000 mg | Freq: Once | INTRAMUSCULAR | Status: AC
  Administered 2020-05-01: 2 mg via INTRAVENOUS
  Filled 2020-05-01: qty 2

## 2020-05-01 NOTE — Plan of Care (Signed)
  Problem: Health Behavior/Discharge Planning: Goal: Ability to manage health-related needs will improve 05/01/2020 0750 by Sidonie Dickens, RN Outcome: Progressing 05/01/2020 0748 by Sidonie Dickens, RN Outcome: Not Progressing   Problem: Clinical Measurements: Goal: Ability to maintain clinical measurements within normal limits will improve 05/01/2020 0750 by Sidonie Dickens, RN Outcome: Progressing 05/01/2020 0748 by Sidonie Dickens, RN Outcome: Progressing Goal: Will remain free from infection 05/01/2020 0750 by Sidonie Dickens, RN Outcome: Progressing 05/01/2020 0748 by Sidonie Dickens, RN Outcome: Progressing Goal: Diagnostic test results will improve 05/01/2020 0750 by Sidonie Dickens, RN Outcome: Progressing 05/01/2020 0748 by Sidonie Dickens, RN Outcome: Not Progressing Goal: Respiratory complications will improve 05/01/2020 0750 by Sidonie Dickens, RN Outcome: Progressing 05/01/2020 0748 by Sidonie Dickens, RN Outcome: Not Progressing Goal: Cardiovascular complication will be avoided 05/01/2020 0750 by Sidonie Dickens, RN Outcome: Progressing 05/01/2020 0748 by Sidonie Dickens, RN Outcome: Progressing   Problem: Activity: Goal: Risk for activity intolerance will decrease 05/01/2020 0750 by Sidonie Dickens, RN Outcome: Progressing 05/01/2020 0748 by Sidonie Dickens, RN Outcome: Not Progressing   Problem: Nutrition: Goal: Adequate nutrition will be maintained 05/01/2020 0750 by Sidonie Dickens, RN Outcome: Progressing 05/01/2020 0748 by Sidonie Dickens, RN Outcome: Progressing   Problem: Coping: Goal: Level of anxiety will decrease 05/01/2020 0750 by Sidonie Dickens, RN Outcome: Progressing 05/01/2020 0748 by Sidonie Dickens, RN Outcome: Not Progressing   Problem: Elimination: Goal: Will not experience complications related to bowel motility 05/01/2020 0750 by Sidonie Dickens, RN Outcome: Progressing 05/01/2020 0748 by Sidonie Dickens, RN Outcome: Progressing Goal:  Will not experience complications related to urinary retention 05/01/2020 0750 by Sidonie Dickens, RN Outcome: Progressing 05/01/2020 0748 by Sidonie Dickens, RN Outcome: Progressing   Problem: Pain Managment: Goal: General experience of comfort will improve 05/01/2020 0750 by Sidonie Dickens, RN Outcome: Progressing 05/01/2020 0748 by Sidonie Dickens, RN Outcome: Progressing   Problem: Safety: Goal: Ability to remain free from injury will improve 05/01/2020 0750 by Sidonie Dickens, RN Outcome: Progressing 05/01/2020 0748 by Sidonie Dickens, RN Outcome: Progressing   Problem: Skin Integrity: Goal: Risk for impaired skin integrity will decrease 05/01/2020 0750 by Sidonie Dickens, RN Outcome: Progressing 05/01/2020 0748 by Sidonie Dickens, RN Outcome: Progressing

## 2020-05-01 NOTE — Progress Notes (Signed)
eLink Physician-Brief Progress Note Patient Name: Wayne [name] DOB: 1976-04-06 MRN: [id number]   Date of Service  05/01/2020  HPI/Events of Note  Agitation - Patient is lightly sedated in spite of Fentanyl IV infusion at 400 mcg/hour and Propofol at 80 mcg/kg/min. Request for extra dose of sedation to supine patient at 5:30 AM.  eICU Interventions  Plan: 1. Versed 2 mg IV at 5:30 AM prior to supine patient at 5:30 AM.      Intervention Category Major Interventions: Delirium, psychosis, severe agitation - evaluation and management  Mikaella Escalona Eugene 05/01/2020, 4:16 AM

## 2020-05-01 NOTE — Progress Notes (Signed)
  Echocardiogram 2D Echocardiogram has been performed.  Wayne [name] 05/01/2020, 10:18 AM

## 2020-05-01 NOTE — Progress Notes (Addendum)
05/01/2020  I saw and evaluated the patient. Discussed with resident and agree with resident's findings and plan as documented in the resident's note.  I have seen and evaluated the patient for ARDS.   S:  Doing better today, CXR looks a great deal better. He is now supine. Some issues getting him sedated well enough overnight.   O:  Blood pressure 107/84, pulse 85, temperature 98.4 F (36.9 C), temperature source Axillary, resp. rate (!) 32, height 6' (1.829 m), weight (!) 152 kg, SpO2 99 %.  No acute distress on vent  ETT in place, moderate thick bloody secretions  Ext R>L trace edema, old scar R leg  RASS -5  Heart sounds regular ext warm   A:  Acute hypoxemic respiratory failure with ARDS-type pattern on CXR. Rapidity of improvement with PEEP points more toward fluid vs. Atelectasis being a large component.  Need for sedation for mechanical ventilation  Steroid-associated hyperglycemia  Class 3 obesity  COVID in Aug   P:  - Drop steroids to daily  - Add levemir, continue SSI  - Based on ABG this AM, does not seem to need further proning  - Give another couple doses lasix  - Continue CAP coverage for now  - Wean FiO2/PEEP per ARDS protocol, discussed with RT  - Not ready for SAT/SBT at present time  - f/u LE duplex - Will update family when they come in   Patient critically ill due to acute hypoxemic and hypercarbic respiratory failure  Interventions to address this today ventilator and sedation weaning  Risk of deterioration without these interventions is high   I personally spent 41 minutes providing critical care not including any separately billable procedures   Myrla Halsted MD  [facility]  05/01/2020 8:32 AM  Personal pager: [phone]  If unanswered, please page  CCM On-call: #[phone]       NAME:  Wayne [name], MRN:  [id number], DOB:  12-07-1975, LOS: 1 ADMISSION DATE:  04/30/2020, CONSULTATION DATE:  04/30/20 REFERRING MD:  Ward   CHIEF COMPLAINT:  AMS   Brief History   Wayne [name] is a 44 y.o. male who was admitted 11/22 with AMS and acute hypoxic and hypercapnic respiratory failure requiring intubation.  History of present illness   Pt is encephelopathic; therefore, this HPI is obtained from chart review. Wayne [name] is a 44 y.o. male who has a PMH as outlined below including COVID PNA in Aug 2021 for which he was trated with steroids and remdesevir and was discharged home on room air (see "past medical history" for rest).  He presented to [facility] ED 11/22 with AMS and acute hypoxic and hypercapnic respiratory failure.  Wife states she woke up from sleep and found him slumped over with a plate of food next to him.  He was sonorous and she was unable to arouse him.    EMS arrived and administered 4mg  narcan with no response.  He was brought to ED where he was subsequently intubated.  Post intubation, he had SVT vs A.fib with rates as high as 250's.  He was cardioverted and had improvement in rates to 110 - 120.  Post intubated, he remained hypoxic to the 80's despite FiO2 100 and PEEP 15.  CT head ws negative.  CXR showed edema and consolidation.  Due to persistent hypoxia as well as hx of COVID, LE duplex was ordered and is pending.  Past Medical History  has Internal hemorrhoids; Iron deficiency anemia; Chronic GI bleeding; Obesity;  Alcohol abuse; B12 deficiency; Achilles rupture, right; Hx of acute gouty arthritis; Essential hypertension; Folliculitis; Internal and external bleeding hemorrhoids; Osteochondral defect of talus; Closed fracture of left lateral malleolus; Ankle syndesmosis disruption, left, initial encounter; Effusion, left knee; Pseudofolliculitis barbae; Pain in left ankle and joints of left foot; Body mass index 40.0-44.9, adult (HCC); Acute idiopathic gout of right knee; Gout; Pneumonia due to COVID-19 virus; Acute respiratory failure with hypoxia and hypercapnia (HCC); ARDS (adult respiratory distress  syndrome) (HCC); Atrial fibrillation with RVR (HCC); 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  Significant Hospital Events   11/22 > admit.  Consults:  None.  Procedures:  ETT 11/22 >   Significant Diagnostic Tests:  CT head 11/22 > neg. LE duplex 11/22 >   Micro Data:  Flu 11/22 > neg. COVID 11/22 > neg. Respiratory culture 11/22 >> Blood culture 11/22 >>  Antimicrobials:  Ceftriaxone 11/22 >  Azithro 11/22 >    Interim history/subjective:  Flipped back to supine this morning. Waking up maxed on propofol and fentanyl, now sedated with versed push.   Objective:  Blood pressure 107/84, pulse 90, temperature 99.7 F (37.6 C), temperature source Axillary, resp. rate (!) 32, height 6' (1.829 m), weight (!) 152 kg, SpO2 100 %.    Vent Mode: PRVC FiO2 (%):  [100 %] 100 % Set Rate:  [20 bmp-32 bmp] 32 bmp Vt Set:  [540 mL-620 mL] 540 mL PEEP:  [15 cmH20] 15 cmH20 Plateau Pressure:  [27 cmH20-31 cmH20] 29 cmH20   Intake/Output Summary (Last 24 hours) at 05/01/2020 1914 Last data filed at 05/01/2020 0400 Gross per 24 hour  Intake 3812.76 ml  Output 3955 ml  Net -142.24 ml   Filed Weights   04/30/20 0600 04/30/20 0830  Weight: (!) 144 kg (!) 152 kg    Examination: General: Adult male, obese, in NAD, supine Neuro: Sedated, unresponsive, pupils small but equal and reactive HEENT: [place]/AT. Sclerae anicteric.  ETT in place. Cardiovascular: RRR, no M/R/G.  Lungs: Rhoncorous bilaterally, PRVC on vent 100% fio2 Abdomen: Obese. BS x 4, soft, NT/ND.  Musculoskeletal: RLE skin changes from prior surgery.  No gross deformities, no edema.  Skin: Intact, warm, no rashes.  Assessment & Plan:   ARDS - presumed 2/2 acute pulmonary edema +/- aspiration.  He did have COVID back in Aug 2021 but per wife, no lingering effects from this and never required oxygen. Probable OSA / OHS - per wife, no hx of ever needing CPAP therapy. CXR and ABG  improving s/p lasix and vent support.  - repeat lasix 40 mg IV  - Continue full vent support, decrease FiO2, maintain driving pressures <78 - Proning per ARDS protocol. - Heavy sedation with propofol / fentanyl and PRN vecuronium and prn versed. - Ceftriaxone / Azithromycin for now. - Follow cultures. - Solumedrol given hx recent COVID. - Follow CXR. - echo pending   ? PE - no hx of dyspnea per wife; however, at risk due to recent COVID and morbid obesity. - Assess LE duplex, order pending   SVT - resolved  initially SVT approaching 250's, s/p cardioversion with improvement to 110 - 120's.  Anemia - no hx of bleeding. - Transfuse for Hgb < 7.  AMS - likely 2/2 hypoxia/hypercapnia.  No other derangements to explain at this point.  CT head neg. UDS negative   Hyperglycemia. - SSI - start levemir 10U bid  Hx HTN. - Hold home amlodipine, losartan.  Hx  gout. - Continue home allopurinol, colchicine.   Best Practice:  Diet: TF's. Pain/Anxiety/Delirium protocol (if indicated): Propofol gtt / fentanyl gtt.  VAP protocol (if indicated): In place. DVT prophylaxis: Lovenox. GI prophylaxis: PPI. Glucose control: SSI. Mobility: Bedrest. Code Status: Full. Family Communication: Girlfriend and mother updated yesterday, 11/22, will contact today.  Disposition: ICU.  Labs   CBC: Recent Labs  Lab 04/30/20 0612 04/30/20 0702 04/30/20 0913 04/30/20 1117  WBC 4.1  --  4.1  --   NEUTROABS 1.1*  --   --   --   HGB 8.7* 16.0 14.2 15.3  HCT 29.5* 47.0 46.2 45.0  MCV 101.7*  --  96.5  --   PLT 140*  --  193  --    Basic Metabolic Panel: Recent Labs  Lab 04/30/20 0612 04/30/20 0702 04/30/20 0913 04/30/20 1117 04/30/20 1403 04/30/20 1711  NA 146* 142  --  143 141  --   K 2.5* 4.8  --  4.2 4.8  --   CL 122*  --   --   --  106  --   CO2 16*  --   --   --  24  --   GLUCOSE 186*  --   --   --  171*  --   BUN 12  --   --   --  19  --   CREATININE 0.66  --  1.35*  --  1.15  --    CALCIUM 4.7*  --   --   --  8.4*  --   MG  --   --  2.0  --   --  1.6*  PHOS  --   --  4.7*  --   --  3.0   GFR: Estimated Creatinine Clearance: 121.9 mL/min (by C-G formula based on SCr of 1.15 mg/dL). Recent Labs  Lab 04/30/20 0612 04/30/20 0811 04/30/20 0913 04/30/20 0916  PROCALCITON  --   --   --  0.44  WBC 4.1  --  4.1  --   LATICACIDVEN 2.2* 2.0*  --   --    Liver Function Tests: Recent Labs  Lab 04/30/20 0612  AST 23  ALT 21  ALKPHOS 32*  BILITOT 0.3  PROT 3.6*  ALBUMIN 1.9*   No results for input(s): LIPASE, AMYLASE in the last 168 hours. Recent Labs  Lab 04/30/20 0612  AMMONIA 45*   ABG    Component Value Date/Time   PHART 7.256 (L) 04/30/2020 1117   PCO2ART 55.3 (H) 04/30/2020 1117   PO2ART 77 (L) 04/30/2020 1117   HCO3 24.2 04/30/2020 1117   TCO2 26 04/30/2020 1117   ACIDBASEDEF 3.0 (H) 04/30/2020 1117   O2SAT 91.0 04/30/2020 1117    Coagulation Profile: Recent Labs  Lab 04/30/20 0612  INR 1.2   Cardiac Enzymes: No results for input(s): CKTOTAL, CKMB, CKMBINDEX, TROPONINI in the last 168 hours. HbA1C: Hgb A1c MFr Bld  Date/Time Value Ref Range Status  04/30/2020 09:15 AM 5.4 4.8 - 5.6 % Final    Comment:    (NOTE) Pre diabetes:          5.7%-6.4%  Diabetes:              >6.4%  Glycemic control for   <7.0% adults with diabetes    CBG: Recent Labs  Lab 04/30/20 1100 04/30/20 1527 04/30/20 1933 04/30/20 2334 05/01/20 0342  GLUCAP 144* 177* 202* 182* 209*    Seawell, Jaimie A, DO 05/01/2020, 6:35 AM  Pager: [phone]

## 2020-05-01 NOTE — Plan of Care (Signed)
  Problem: Clinical Measurements: Goal: Ability to maintain clinical measurements within normal limits will improve Outcome: Progressing Goal: Will remain free from infection Outcome: Progressing Goal: Cardiovascular complication will be avoided Outcome: Progressing   Problem: Nutrition: Goal: Adequate nutrition will be maintained Outcome: Progressing   Problem: Elimination: Goal: Will not experience complications related to bowel motility Outcome: Progressing Goal: Will not experience complications related to urinary retention Outcome: Progressing   Problem: Pain Managment: Goal: General experience of comfort will improve Outcome: Progressing   Problem: Safety: Goal: Ability to remain free from injury will improve Outcome: Progressing   Problem: Skin Integrity: Goal: Risk for impaired skin integrity will decrease Outcome: Progressing

## 2020-05-02 ENCOUNTER — Inpatient Hospital Stay (HOSPITAL_COMMUNITY): Payer: Self-pay

## 2020-05-02 DIAGNOSIS — J9600 Acute respiratory failure, unspecified whether with hypoxia or hypercapnia: Secondary | ICD-10-CM

## 2020-05-02 DIAGNOSIS — I2699 Other pulmonary embolism without acute cor pulmonale: Secondary | ICD-10-CM

## 2020-05-02 LAB — GLUCOSE, CAPILLARY
Glucose-Capillary: 260 mg/dL — ABNORMAL HIGH (ref 70–99)
Glucose-Capillary: 276 mg/dL — ABNORMAL HIGH (ref 70–99)
Glucose-Capillary: 160 mg/dL — ABNORMAL HIGH (ref 70–99)
Glucose-Capillary: 128 mg/dL — ABNORMAL HIGH (ref 70–99)
Glucose-Capillary: 144 mg/dL — ABNORMAL HIGH (ref 70–99)
Glucose-Capillary: 151 mg/dL — ABNORMAL HIGH (ref 70–99)

## 2020-05-02 LAB — BASIC METABOLIC PANEL
Anion gap: 11 (ref 5–15)
BUN: 25 mg/dL — ABNORMAL HIGH (ref 6–20)
CO2: 21 mmol/L — ABNORMAL LOW (ref 22–32)
Calcium: 8.9 mg/dL (ref 8.9–10.3)
Chloride: 108 mmol/L (ref 98–111)
Creatinine, Ser: 0.97 mg/dL (ref 0.61–1.24)
GFR, Estimated: >60 mL/min (ref >60)
Glucose, Bld: 296 mg/dL — ABNORMAL HIGH (ref 70–99)
Potassium: 4.1 mmol/L (ref 3.5–5.1)
Sodium: 140 mmol/L (ref 135–145)

## 2020-05-02 LAB — CBC
HCT: 35.2 % — ABNORMAL LOW (ref 39.0–52.0)
Hemoglobin: 11 g/dL — ABNORMAL LOW (ref 13.0–17.0)
MCH: 30.1 pg (ref 26.0–34.0)
MCHC: 31.3 g/dL (ref 30.0–36.0)
MCV: 96.2 fL (ref 80.0–100.0)
Platelets: 145 10*3/uL — ABNORMAL LOW (ref 150–400)
RBC: 3.66 MIL/uL — ABNORMAL LOW (ref 4.22–5.81)
RDW: 11.9 % (ref 11.5–15.5)
WBC: 8.6 10*3/uL (ref 4.0–10.5)
nRBC: 0 % (ref 0.0–0.2)

## 2020-05-02 LAB — PHOSPHORUS: Phosphorus: 1.9 mg/dL — ABNORMAL LOW (ref 2.5–4.6)

## 2020-05-02 LAB — MAGNESIUM: Magnesium: 2.6 mg/dL — ABNORMAL HIGH (ref 1.7–2.4)

## 2020-05-02 MED ORDER — LABETALOL HCL 5 MG/ML IV SOLN
5.0000 mg | INTRAVENOUS | Status: DC | PRN
  Administered 2020-05-02 – 2020-05-03 (×3): 5 mg via INTRAVENOUS
  Filled 2020-05-02 (×2): qty 4

## 2020-05-02 MED ORDER — PANTOPRAZOLE SODIUM 40 MG PO TBEC
40.0000 mg | DELAYED_RELEASE_TABLET | Freq: Every day | ORAL | Status: DC
  Administered 2020-05-02: 40 mg via ORAL
  Filled 2020-05-02: qty 1

## 2020-05-02 MED ORDER — METHYLPREDNISOLONE SODIUM SUCC 40 MG IJ SOLR: 20.0000 mg | Freq: Every day | INTRAMUSCULAR | Status: DC

## 2020-05-02 MED ORDER — METOPROLOL TARTRATE 5 MG/5ML IV SOLN
5.0000 mg | INTRAVENOUS | Status: DC | PRN
  Filled 2020-05-02: qty 5

## 2020-05-02 MED ORDER — POLYETHYLENE GLYCOL 3350 17 G PO PACK
17.0000 g | PACK | Freq: Every day | ORAL | Status: DC
  Administered 2020-05-02 – 2020-05-04 (×2): 17 g via ORAL
  Filled 2020-05-02: qty 1

## 2020-05-02 MED ORDER — NITROGLYCERIN 2 % TD OINT
0.5000 [in_us] | TOPICAL_OINTMENT | Freq: Once | TRANSDERMAL | Status: AC
  Administered 2020-05-02: 0.5 [in_us] via TOPICAL
  Filled 2020-05-02: qty 30

## 2020-05-02 MED ORDER — LOSARTAN POTASSIUM 25 MG PO TABS
25.0000 mg | ORAL_TABLET | Freq: Once | ORAL | Status: AC
  Administered 2020-05-02: 25 mg via ORAL
  Filled 2020-05-02: qty 1

## 2020-05-02 MED ORDER — LOSARTAN POTASSIUM 50 MG PO TABS
50.0000 mg | ORAL_TABLET | Freq: Every day | ORAL | Status: DC
  Administered 2020-05-02 – 2020-05-04 (×3): 50 mg via ORAL
  Filled 2020-05-02 (×2): qty 1

## 2020-05-02 MED ORDER — INSULIN ASPART 100 UNIT/ML ~~LOC~~ SOLN
4.0000 [IU] | SUBCUTANEOUS | Status: DC
  Administered 2020-05-02: 4 [IU] via SUBCUTANEOUS

## 2020-05-02 MED ORDER — METHYLPREDNISOLONE SODIUM SUCC 40 MG IJ SOLR: 10.0000 mg | Freq: Every day | INTRAMUSCULAR | Status: DC

## 2020-05-02 MED ORDER — LOSARTAN POTASSIUM 25 MG PO TABS
25.0000 mg | ORAL_TABLET | Freq: Every day | ORAL | Status: DC
  Administered 2020-05-02: 25 mg via ORAL
  Filled 2020-05-02: qty 1

## 2020-05-02 MED ORDER — COLCHICINE 0.6 MG PO TABS
0.6000 mg | ORAL_TABLET | Freq: Every day | ORAL | Status: DC
  Administered 2020-05-02: 0.6 mg via ORAL

## 2020-05-02 MED ORDER — ASPIRIN EC 81 MG PO TBEC
81.0000 mg | DELAYED_RELEASE_TABLET | Freq: Every day | ORAL | Status: DC
  Administered 2020-05-02 – 2020-05-04 (×3): 81 mg via ORAL
  Filled 2020-05-02 (×2): qty 1

## 2020-05-02 MED ORDER — ALLOPURINOL 300 MG PO TABS
300.0000 mg | ORAL_TABLET | Freq: Every day | ORAL | Status: DC
  Administered 2020-05-02 – 2020-05-04 (×3): 300 mg via ORAL
  Filled 2020-05-02: qty 3
  Filled 2020-05-02: qty 1

## 2020-05-02 MED ORDER — INSULIN DETEMIR 100 UNIT/ML ~~LOC~~ SOLN
12.0000 [IU] | Freq: Two times a day (BID) | SUBCUTANEOUS | Status: DC
  Filled 2020-05-02 (×2): qty 0.12

## 2020-05-02 MED ORDER — POTASSIUM & SODIUM PHOSPHATES 280-160-250 MG PO PACK
1.0000 | PACK | Freq: Three times a day (TID) | ORAL | Status: AC
  Administered 2020-05-02 (×2): 1 via ORAL
  Filled 2020-05-02 (×2): qty 1

## 2020-05-02 MED ORDER — POTASSIUM & SODIUM PHOSPHATES 280-160-250 MG PO PACK
1.0000 | PACK | Freq: Three times a day (TID) | ORAL | Status: DC
  Filled 2020-05-02 (×2): qty 1

## 2020-05-02 MED ORDER — FUROSEMIDE 10 MG/ML IJ SOLN
40.0000 mg | Freq: Two times a day (BID) | INTRAMUSCULAR | Status: DC
  Administered 2020-05-02: 40 mg via INTRAVENOUS
  Filled 2020-05-02: qty 4

## 2020-05-02 MED ORDER — AMLODIPINE BESYLATE 10 MG PO TABS
10.0000 mg | ORAL_TABLET | Freq: Every day | ORAL | Status: DC
  Administered 2020-05-02 – 2020-05-04 (×3): 10 mg via ORAL
  Filled 2020-05-02 (×4): qty 1

## 2020-05-02 MED ORDER — INSULIN DETEMIR 100 UNIT/ML ~~LOC~~ SOLN
15.0000 [IU] | Freq: Two times a day (BID) | SUBCUTANEOUS | Status: DC
  Administered 2020-05-02 (×2): 15 [IU] via SUBCUTANEOUS
  Filled 2020-05-02 (×4): qty 0.15

## 2020-05-02 MED ORDER — METHYLPREDNISOLONE SODIUM SUCC 40 MG IJ SOLR: 30.0000 mg | Freq: Every day | INTRAMUSCULAR | Status: DC

## 2020-05-02 MED ORDER — POTASSIUM & SODIUM PHOSPHATES 280-160-250 MG PO PACK
1.0000 | PACK | Freq: Three times a day (TID) | ORAL | Status: DC
  Administered 2020-05-02: 1
  Filled 2020-05-02 (×2): qty 1

## 2020-05-02 MED ORDER — SENNOSIDES-DOCUSATE SODIUM 8.6-50 MG PO TABS
2.0000 | ORAL_TABLET | Freq: Two times a day (BID) | ORAL | Status: AC
  Administered 2020-05-02 – 2020-05-03 (×3): 2 via ORAL
  Filled 2020-05-02 (×2): qty 2

## 2020-05-02 MED ORDER — ACETAMINOPHEN 325 MG PO TABS
650.0000 mg | ORAL_TABLET | Freq: Four times a day (QID) | ORAL | Status: DC | PRN
  Administered 2020-05-02 (×2): 650 mg via ORAL
  Filled 2020-05-02 (×3): qty 2

## 2020-05-02 MED ORDER — NITROGLYCERIN 2 % TD OINT
1.0000 [in_us] | TOPICAL_OINTMENT | Freq: Once | TRANSDERMAL | Status: DC
  Filled 2020-05-02: qty 30

## 2020-05-02 MED ORDER — FUROSEMIDE 10 MG/ML IJ SOLN
40.0000 mg | Freq: Four times a day (QID) | INTRAMUSCULAR | Status: AC
  Administered 2020-05-02 (×3): 40 mg via INTRAVENOUS
  Filled 2020-05-02 (×3): qty 4

## 2020-05-02 NOTE — Progress Notes (Addendum)
05/02/2020  I saw and evaluated the patient. Discussed with resident and agree with resident's findings and plan as documented in the resident's note.  I have seen and evaluated the patient for respiratory failure.  S:  Continue to improve, minimal O2 gradient remaining.  Agitated despite high dose sedation.  O: Blood pressure (!) 156/75, pulse 77, temperature 98.1 F (36.7 C), temperature source Axillary, resp. rate (!) 26, height 6' (1.829 m), weight (!) 150.3 kg, SpO2 99 %.  Overweight man in NAD Lungs more clear, triggering vent Moving all 4 ext Trace edema remains  BMP looks okay Sugars high  A:  Acute hypoxemic respiratory failure secondary to either massive derecruitment or flash pulmonary edema.  CAP and ARDS are now thought less likely given rapidity of radiographic improvement.  Hypertension  Steroid-induced hyperglycemia without underlying DM  Class 3 obesity  P:  - Wean to extubate - Aggressive diuresis - Taper steroids - Levemir, watch sugars with steroid wean and loss of TF - Abx off - Progressive mobility   Patient critically ill due to respiratory failure Interventions to address this today ventilator weaning, diuresis Risk of deterioration without these interventions is high  I personally spent 15 minutes providing critical care not including any separately billable procedures  Myrla Halstedan Whitleigh Garramone MD [facility] 05/02/2020 9:56 AM Personal pager: [phone] If unanswered, please page CCM On-call: [phone]      NAME:  Wayne [name], MRN:  [id number], DOB:  10/17/1975, LOS: 2 ADMISSION DATE:  04/30/2020, CONSULTATION DATE:  04/30/20 REFERRING MD:  Ward  CHIEF COMPLAINT:  AMS   Brief History   Wayne [name] is a 44 y.o. male who was admitted 11/22 with AMS and acute hypoxic and hypercapnic respiratory failure requiring intubation.  History of present illness   Pt is encephelopathic; therefore, this HPI is obtained from chart  review. Wayne [name] is a 44 y.o. male who has a PMH as outlined below including COVID PNA in Aug 2021 for which he was trated with steroids and remdesevir and was discharged home on room air (see "past medical history" for rest).  He presented to [facility] ED 11/22 with AMS and acute hypoxic and hypercapnic respiratory failure.  Wife states she woke up from sleep and found him slumped over with a plate of food next to him.  He was sonorous and she was unable to arouse him.    EMS arrived and administered 4mg  narcan with no response.  He was brought to ED where he was subsequently intubated.  Post intubation, he had SVT vs A.fib with rates as high as 250's.  He was cardioverted and had improvement in rates to 110 - 120.  Post intubated, he remained hypoxic to the 80's despite FiO2 100 and PEEP 15.  CT head ws negative.  CXR showed edema and consolidation.  Due to persistent hypoxia as well as hx of COVID, LE duplex was ordered and is pending.  Past Medical History  has Internal hemorrhoids; Iron deficiency anemia; Chronic GI bleeding; Obesity; Alcohol abuse; B12 deficiency; Achilles rupture, right; Hx of acute gouty arthritis; Essential hypertension; Folliculitis; Internal and external bleeding hemorrhoids; Osteochondral defect of talus; Closed fracture of left lateral malleolus; Ankle syndesmosis disruption, left, initial encounter; Effusion, left knee; Pseudofolliculitis barbae; Pain in left ankle and joints of left foot; Body mass index 40.0-44.9, adult (HCC); Acute idiopathic gout of right knee; Gout; Pneumonia due to COVID-19 virus; Acute respiratory failure with hypoxia and hypercapnia (HCC); ARDS (adult respiratory distress syndrome) (HCC);  Atrial fibrillation with RVR (HCC); 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  Significant Hospital Events   11/22 > admit.  Consults:  None.  Procedures:  ETT 11/22 > 1124  Significant Diagnostic Tests:  CT head 11/22  > neg. LE duplex 11/22 >   Micro Data:  Flu 11/22 > neg. COVID 11/22 > neg. Respiratory culture 11/22 >> Blood culture 11/22 >>  Antimicrobials:  Ceftriaxone 11/22 > 11/24 Azithro 11/22 > 11/24  Interim history/subjective:  Awake on propofol and fentanyl this morning. Extubated, sats 96% on 4L, following commands.   Objective:  Blood pressure (!) 145/72, pulse 79, temperature 99.1 F (37.3 C), temperature source Axillary, resp. rate (!) 32, height 6' (1.829 m), weight (!) 150.3 kg, SpO2 98 %.    Vent Mode: PRVC FiO2 (%):  [40 %-100 %] 40 % Set Rate:  [32 bmp] 32 bmp Vt Set:  [540 mL] 540 mL PEEP:  [10 cmH20-15 cmH20] 10 cmH20 Plateau Pressure:  [23 cmH20-28 cmH20] 23 cmH20   Intake/Output Summary (Last 24 hours) at 05/02/2020 8676 Last data filed at 05/02/2020 0600 Gross per 24 hour  Intake 5913.67 ml  Output 3175 ml  Net 2738.67 ml   Filed Weights   04/30/20 0600 04/30/20 0830 05/02/20 0315  Weight: (!) 144 kg (!) 152 kg (!) 150.3 kg    Examination: General: Adult male, obese, in NAD, supine Neuro: alert and oriented, answering questions, following commands HEENT: [place]/AT. Sclerae anicteric.   Cardiovascular: RRR, no M/R/G Lungs: Rhoncorous bilaterally, PRVC on vent 100% fio2 Abdomen: Obese. BS x 4, soft, NT/ND.  Musculoskeletal: RLE skin changes from prior surgery.  No gross deformities, no edema.  Skin: Intact, warm, no rashes.  Resolved:  SVT s/p cardioversion  Assessment & Plan:   ARDS - presumed 2/2 acute pulmonary edema +/- aspiration.  He did have COVID back in Aug 2021 but per wife, no lingering effects from this and never required oxygen. Probable OSA / OHS - per wife, no hx of ever needing CPAP therapy. Echo w/normal EF, normal pulmonary pressures.  - Extubated to 4L [place], satting well - net positive despite lasix IV - increase to bid today  - completed azithro, stop ceftriaxone after today, no leukocytosis, procal .44 - Follow cultures - NGTD -  Solumedrol given hx recent COVID.  - discontinue arterial line  - PT/OT   ? PE - no hx of dyspnea per wife; however, at risk due to recent COVID and morbid obesity. - Assess LE duplex, not done yet for some reason. Reordered.    Anemia - no hx of bleeding. - Transfuse for Hgb < 7.  AMS - likely 2/2 hypoxia/hypercapnia.  No other derangements to explain at this point.  CT head neg. UDS negative   Hyperglycemia. - SSI - increase levemir to 15U bid  Hx HTN. - hold losartan. - restart norvasc   Hx gout. - Continue home allopurinol, colchicine.   Best Practice:  Diet: NPO pending bedside swallow  Pain/Anxiety/Delirium protocol (if indicated): na VAP protocol (if indicated): In place. DVT prophylaxis: Lovenox. GI prophylaxis: PPI. Glucose control: SSI. Mobility: Bedrest. Code Status: Full. Family Communication: Girlfriend and mother updated 11/23, will call again today  Disposition: ICU.  Labs   CBC: Recent Labs  Lab 04/30/20 0612 04/30/20 0702 04/30/20 0913 04/30/20 1117 05/01/20 0645 05/01/20 0759 05/02/20 0405  WBC 4.1  --  4.1  --  9.2  --  8.6  NEUTROABS 1.1*  --   --   --   --   --   --  HGB 8.7*   < > 14.2 15.3 11.9* 12.2* 11.0*  HCT 29.5*   < > 46.2 45.0 38.4* 36.0* 35.2*  MCV 101.7*  --  96.5  --  98.2  --  96.2  PLT 140*  --  193  --  181  --  145*   < > = values in this interval not displayed.   Basic Metabolic Panel: Recent Labs  Lab 04/30/20 0612 04/30/20 0702 04/30/20 0913 04/30/20 1117 04/30/20 1403 04/30/20 1711 05/01/20 0645 05/01/20 0759 05/02/20 0405  NA 146*   < >  --  143 141  --  138 141 140  K 2.5*   < >  --  4.2 4.8  --  4.4 4.4 4.1  CL 122*  --   --   --  106  --  108  --  108  CO2 16*  --   --   --  24  --  20*  --  21*  GLUCOSE 186*  --   --   --  171*  --  280*  --  296*  BUN 12  --   --   --  19  --  20  --  25*  CREATININE 0.66  --  1.35*  --  1.15  --  1.10  --  0.97  CALCIUM 4.7*  --   --   --  8.4*  --  8.5*  --   8.9  MG  --   --  2.0  --   --  1.6* 2.4  --  2.6*  PHOS  --   --  4.7*  --   --  3.0 2.6  --  1.9*   < > = values in this interval not displayed.   GFR: Estimated Creatinine Clearance: 143.6 mL/min (by C-G formula based on SCr of 0.97 mg/dL). Recent Labs  Lab 04/30/20 0612 04/30/20 0811 04/30/20 0913 04/30/20 0916 05/01/20 0645 05/02/20 0405  PROCALCITON  --   --   --  0.44  --   --   WBC 4.1  --  4.1  --  9.2 8.6  LATICACIDVEN 2.2* 2.0*  --   --   --   --    Liver Function Tests: Recent Labs  Lab 04/30/20 0612  AST 23  ALT 21  ALKPHOS 32*  BILITOT 0.3  PROT 3.6*  ALBUMIN 1.9*   No results for input(s): LIPASE, AMYLASE in the last 168 hours. Recent Labs  Lab 04/30/20 0612  AMMONIA 45*   ABG    Component Value Date/Time   PHART 7.340 (L) 05/01/2020 0759   PCO2ART 43.9 05/01/2020 0759   PO2ART 318 (H) 05/01/2020 0759   HCO3 23.7 05/01/2020 0759   TCO2 25 05/01/2020 0759   ACIDBASEDEF 2.0 05/01/2020 0759   O2SAT 100.0 05/01/2020 0759    Coagulation Profile: Recent Labs  Lab 04/30/20 0612  INR 1.2   Cardiac Enzymes: No results for input(s): CKTOTAL, CKMB, CKMBINDEX, TROPONINI in the last 168 hours. HbA1C: Hgb A1c MFr Bld  Date/Time Value Ref Range Status  04/30/2020 09:15 AM 5.4 4.8 - 5.6 % Final    Comment:    (NOTE) Pre diabetes:          5.7%-6.4%  Diabetes:              >6.4%  Glycemic control for   <7.0% adults with diabetes    CBG: Recent Labs  Lab 05/01/20 1118 05/01/20 1521 05/01/20 1935  05/01/20 2325 05/02/20 0333  GLUCAP 299* 281* 253* 235* 260*    [address] Jaimie A, DO 05/02/2020, 6:52 AM Pager: [phone]

## 2020-05-02 NOTE — Progress Notes (Signed)
eLink Physician-Brief Progress Note Patient Name: Dashun Borre DOB: 1975/07/20 MRN: [id number]   Date of Service  05/02/2020  HPI/Events of Note  Request for renewal of restraints Patient remains intubated on video assessment and at risk of pulling tubes  eICU Interventions  Renewed restraints until bedside team can assess if this will be continued     Intervention Category Minor Interventions: Other:  Darl Pikes 05/02/2020, 5:30 AM

## 2020-05-02 NOTE — Progress Notes (Signed)
Lower extremity venous bilateral study completed.  Preliminary results relayed to MD.  See CV Proc for preliminary results report.   Rayya Yagi, RDMS  

## 2020-05-02 NOTE — Procedures (Signed)
Extubation Procedure Note  Patient Details:   Name: Wayne [name] DOB: August 15, 1975 MRN: [id number]   Airway Documentation:    Vent end date: 05/02/20 Vent end time: 0805   Evaluation  O2 sats: stable throughout Complications: No apparent complications Patient did tolerate procedure well. Bilateral Breath Sounds: Diminished   Yes   Pt was extubated per MD order and placed on 3 L [place]. Cuff leak was noted prior to extubation and no stridor heard post. Pt is stable at this time. RT will monitor.   Merlene Laughter 05/02/2020, 8:30 AM

## 2020-05-02 NOTE — Evaluation (Signed)
Physical Therapy Evaluation Patient Details Name: Wayne [name] MRN: [id number] DOB: 1976-05-04 Today's Date: 05/02/2020   History of Present Illness  Pt is 44 yo male hospitalized for COVID in August with ongoing dyspnea who presented to ED with unresponsiveness.  Pt intubated in ED and admitted with hypercarbic and hypoxemic respiratory failure secondary to ARDS. Pt intubated 04/30/20-05/02/20.  Clinical Impression  Pt admitted with above diagnosis. Pt extubated earlier today and was able to make good progress with therapy.  Overall, requiring min A for steadying and line/lead management.  VSS on 3 L O2.  Tolerated transfer to chair well. Noted that he has experienced dyspnea since COVID in August per MD note.   Expected to progress well - may need possible HHPT or may progress to no needs.  Has good family support and necessary DME.  Pt currently with functional limitations due to the deficits listed below (see PT Problem List). Pt will benefit from skilled PT to increase their independence and safety with mobility to allow discharge to the venue listed below.       Follow Up Recommendations Home health PT;Supervision - Intermittent (possible HHPT - may advance to no needs)    Equipment Recommendations  None recommended by PT    Recommendations for Other Services       Precautions / Restrictions Precautions Precautions: Other (comment) Precaution Comments: L wrist arterial line      Mobility  Bed Mobility Overal bed mobility: Needs Assistance Bed Mobility: Supine to Sit     Supine to sit: Min assist;HOB elevated     General bed mobility comments: increased time and use of bed rails    Transfers Overall transfer level: Needs assistance   Transfers: Sit to/from Stand;Squat Pivot Transfers Sit to Stand: Min assist   Squat pivot transfers: Min assist     General transfer comment: increased time, cues for posture and safe hand placement; assist with line lead management,  min A to steady  Ambulation/Gait Ambulation/Gait assistance: Min assist Gait Distance (Feet): 2 Feet Assistive device: 1 person hand held assist Gait Pattern/deviations: Step-to pattern;Decreased stride length;Wide base of support Gait velocity: decreased   General Gait Details: min A to steady with HHA; limited due to lines leads  Stairs            Wheelchair Mobility    Modified Rankin (Stroke Patients Only)       Balance Overall balance assessment: Needs assistance Sitting-balance support: No upper extremity supported Sitting balance-Leahy Scale: Good     Standing balance support: Single extremity supported;No upper extremity supported Standing balance-Leahy Scale: Fair Standing balance comment: Pt preferring to have UE support at this time                             Pertinent Vitals/Pain Pain Assessment: No/denies pain    Home Living Family/patient expects to be discharged to:: Private residence Living Arrangements: Other (Comment) (family) Available Help at Discharge: Family;Available 24 hours/day Type of Home: House Home Access: Stairs to enter   [employer] of Steps: 1 Home Layout: One level Home Equipment: Walker - 2 wheels;Crutches;Cane - single point Additional Comments: reports grandmother has w/c he could borrow    Prior Function Level of Independence: Independent         Comments: Independent with IADLs, ADLs, and community ambulation     Hand Dominance        Extremity/Trunk Assessment   Upper Extremity Assessment Upper  Extremity Assessment: Overall WFL for tasks assessed    Lower Extremity Assessment Lower Extremity Assessment: Overall WFL for tasks assessed (ROM WFL; MMT 5/5)    Cervical / Trunk Assessment Cervical / Trunk Assessment: Normal  Communication   Communication: No difficulties  Cognition Arousal/Alertness: Awake/alert Behavior During Therapy: WFL for tasks assessed/performed Overall  Cognitive Status: Within Functional Limits for tasks assessed                                        General Comments General comments (skin integrity, edema, etc.): VSS on 3 L O2    Exercises     Assessment/Plan    PT Assessment Patient needs continued PT services  PT Problem List Decreased strength;Decreased mobility;Decreased activity tolerance;Decreased balance;Decreased knowledge of use of DME;Cardiopulmonary status limiting activity       PT Treatment Interventions DME instruction;Therapeutic activities;Gait training;Therapeutic exercise;Patient/family education;Stair training;Balance training;Functional mobility training    PT Goals (Current goals can be found in the Care Plan section)  Acute Rehab PT Goals Patient Stated Goal: return home PT Goal Formulation: With patient Time For Goal Achievement: 05/16/20 Potential to Achieve Goals: Good    Frequency Min 3X/week   Barriers to discharge        Co-evaluation               AM-PAC PT "6 Clicks" Mobility  Outcome Measure Help needed turning from your back to your side while in a flat bed without using bedrails?: A Little Help needed moving from lying on your back to sitting on the side of a flat bed without using bedrails?: A Little Help needed moving to and from a bed to a chair (including a wheelchair)?: A Little Help needed standing up from a chair using your arms (e.g., wheelchair or bedside chair)?: A Little Help needed to walk in hospital room?: A Little Help needed climbing 3-5 steps with a railing? : A Little 6 Click Score: 18    End of Session Equipment Utilized During Treatment: Oxygen Activity Tolerance: Patient tolerated treatment well Patient left: in chair;with call bell/phone within reach;with family/visitor present Nurse Communication: Mobility status PT Visit Diagnosis: Other abnormalities of gait and mobility (R26.89);Unsteadiness on feet (R26.81)    Time: 0102-7253 PT  Time Calculation (min) (ACUTE ONLY): 30 min   Charges:   PT Evaluation $PT Eval Moderate Complexity: 1 Mod PT Treatments $Therapeutic Activity: 8-22 mins        Anise Salvo, PT Acute Rehab Services Pager [phone] [facility] [phone]    Rayetta Humphrey 05/02/2020, 5:06 PM

## 2020-05-03 ENCOUNTER — Other Ambulatory Visit: Payer: Self-pay

## 2020-05-03 ENCOUNTER — Encounter (HOSPITAL_COMMUNITY): Payer: Self-pay | Admitting: Internal Medicine

## 2020-05-03 LAB — BASIC METABOLIC PANEL
Anion gap: 13 (ref 5–15)
BUN: 29 mg/dL — ABNORMAL HIGH (ref 6–20)
CO2: 24 mmol/L (ref 22–32)
Calcium: 9.4 mg/dL (ref 8.9–10.3)
Chloride: 105 mmol/L (ref 98–111)
Creatinine, Ser: 1.04 mg/dL (ref 0.61–1.24)
GFR, Estimated: >60 mL/min (ref >60)
Glucose, Bld: 213 mg/dL — ABNORMAL HIGH (ref 70–99)
Potassium: 4.1 mmol/L (ref 3.5–5.1)
Sodium: 142 mmol/L (ref 135–145)

## 2020-05-03 LAB — CBC
HCT: 39.4 % (ref 39.0–52.0)
Hemoglobin: 12.7 g/dL — ABNORMAL LOW (ref 13.0–17.0)
MCH: 30.2 pg (ref 26.0–34.0)
MCHC: 32.2 g/dL (ref 30.0–36.0)
MCV: 93.6 fL (ref 80.0–100.0)
Platelets: 193 10*3/uL (ref 150–400)
RBC: 4.21 MIL/uL — ABNORMAL LOW (ref 4.22–5.81)
RDW: 11.9 % (ref 11.5–15.5)
WBC: 10.3 10*3/uL (ref 4.0–10.5)
nRBC: 0 % (ref 0.0–0.2)

## 2020-05-03 LAB — GLUCOSE, CAPILLARY
Glucose-Capillary: 189 mg/dL — ABNORMAL HIGH (ref 70–99)
Glucose-Capillary: 149 mg/dL — ABNORMAL HIGH (ref 70–99)
Glucose-Capillary: 124 mg/dL — ABNORMAL HIGH (ref 70–99)
Glucose-Capillary: 122 mg/dL — ABNORMAL HIGH (ref 70–99)
Glucose-Capillary: 118 mg/dL — ABNORMAL HIGH (ref 70–99)

## 2020-05-03 LAB — CULTURE, RESPIRATORY W GRAM STAIN: Culture: NO GROWTH

## 2020-05-03 LAB — MAGNESIUM: Magnesium: 2.2 mg/dL (ref 1.7–2.4)

## 2020-05-03 LAB — PHOSPHORUS: Phosphorus: 3.9 mg/dL (ref 2.5–4.6)

## 2020-05-03 MED ORDER — INSULIN DETEMIR 100 UNIT/ML ~~LOC~~ SOLN
5.0000 [IU] | Freq: Two times a day (BID) | SUBCUTANEOUS | Status: DC
  Administered 2020-05-03 – 2020-05-04 (×3): 5 [IU] via SUBCUTANEOUS
  Filled 2020-05-03 (×4): qty 0.05

## 2020-05-03 MED ORDER — OXYCODONE HCL 5 MG PO TABS
10.0000 mg | ORAL_TABLET | ORAL | Status: DC | PRN
  Administered 2020-05-03: 10 mg via ORAL
  Filled 2020-05-03: qty 2

## 2020-05-03 MED ORDER — CARVEDILOL 6.25 MG PO TABS
6.2500 mg | ORAL_TABLET | Freq: Two times a day (BID) | ORAL | Status: DC
  Administered 2020-05-03 – 2020-05-04 (×3): 6.25 mg via ORAL
  Filled 2020-05-03 (×3): qty 1

## 2020-05-03 MED ORDER — PSYLLIUM 95 % PO PACK
1.0000 | PACK | Freq: Every day | ORAL | Status: DC
  Administered 2020-05-03 – 2020-05-04 (×2): 1 via ORAL
  Filled 2020-05-03 (×3): qty 1

## 2020-05-03 MED ORDER — INSULIN ASPART 100 UNIT/ML ~~LOC~~ SOLN: 0.0000 [IU] | Freq: Every day | SUBCUTANEOUS | Status: DC

## 2020-05-03 MED ORDER — INSULIN ASPART 100 UNIT/ML ~~LOC~~ SOLN
0.0000 [IU] | Freq: Three times a day (TID) | SUBCUTANEOUS | Status: DC
  Administered 2020-05-03 – 2020-05-04 (×3): 3 [IU] via SUBCUTANEOUS

## 2020-05-03 MED ORDER — CYCLOBENZAPRINE HCL 10 MG PO TABS
5.0000 mg | ORAL_TABLET | Freq: Three times a day (TID) | ORAL | Status: DC | PRN
  Filled 2020-05-03: qty 1

## 2020-05-03 MED ORDER — LABETALOL HCL 5 MG/ML IV SOLN
5.0000 mg | Freq: Once | INTRAVENOUS | Status: AC
  Administered 2020-05-03: 5 mg via INTRAVENOUS
  Filled 2020-05-03: qty 4

## 2020-05-03 MED ORDER — PNEUMOCOCCAL VAC POLYVALENT 25 MCG/0.5ML IJ INJ
0.5000 mL | INJECTION | INTRAMUSCULAR | Status: AC
  Administered 2020-05-04: 0.5 mL via INTRAMUSCULAR
  Filled 2020-05-03: qty 0.5

## 2020-05-03 NOTE — Progress Notes (Signed)
eLink Physician-Brief Progress Note Patient Name: Wayne [name] DOB: June 16, 1975 MRN: [id number]   Date of Service  05/03/2020  HPI/Events of Note  Hypertension. SBP in 170s. HR 90s. Was given labetalol 5 mg at around 3 am.   eICU Interventions  5 mg x 1 now  Please let me know if SBP remains high 30 min after giving this dose, patient in no distress on camera      Intervention Category Major Interventions: Hypertension - evaluation and management  Oretha Milch 05/03/2020, 3:35 AM

## 2020-05-03 NOTE — Progress Notes (Signed)
NAME:  Wayne [name], MRN:  [id number], DOB:  1975/09/09, LOS: 3 ADMISSION DATE:  04/30/2020, CONSULTATION DATE:  04/30/20 REFERRING MD:  Ward  CHIEF COMPLAINT:  AMS   Brief History   Wayne [name] is a 44 y.o. male who was admitted 11/22 with AMS and acute hypoxic and hypercapnic respiratory failure requiring intubation.  History of present illness   Pt is encephelopathic; therefore, this HPI is obtained from chart review. Wayne [name] is a 44 y.o. male who has a PMH as outlined below including COVID PNA in Aug 2021 for which he was trated with steroids and remdesevir and was discharged home on room air (see "past medical history" for rest).  He presented to [facility] ED 11/22 with AMS and acute hypoxic and hypercapnic respiratory failure.  Wife states she woke up from sleep and found him slumped over with a plate of food next to him.  He was sonorous and she was unable to arouse him.    EMS arrived and administered 4mg  narcan with no response.  He was brought to ED where he was subsequently intubated.  Post intubation, he had SVT vs A.fib with rates as high as 250's.  He was cardioverted and had improvement in rates to 110 - 120.  Post intubated, he remained hypoxic to the 80's despite FiO2 100 and PEEP 15.  CT head ws negative.  CXR showed edema and consolidation.  Due to persistent hypoxia as well as hx of COVID, LE duplex was ordered and is pending.  Past Medical History  has Internal hemorrhoids; Iron deficiency anemia; Chronic GI bleeding; Obesity; Alcohol abuse; B12 deficiency; Achilles rupture, right; Hx of acute gouty arthritis; Essential hypertension; Folliculitis; Internal and external bleeding hemorrhoids; Osteochondral defect of talus; Closed fracture of left lateral malleolus; Ankle syndesmosis disruption, left, initial encounter; Effusion, left knee; Pseudofolliculitis barbae; Pain in left ankle and joints of left foot; Body mass index 40.0-44.9, adult (HCC); Acute idiopathic gout of  right knee; Gout; Pneumonia due to COVID-19 virus; Acute respiratory failure with hypoxia and hypercapnia (HCC); ARDS (adult respiratory distress syndrome) (HCC); Atrial fibrillation with RVR (HCC); 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  Significant Hospital Events   11/22 > admit.  Consults:  None.  Procedures:  ETT 11/22 > 1124  Significant Diagnostic Tests:  CT head 11/22 > neg. LE duplex 11/22 >   Micro Data:  Flu 11/22 > neg. COVID 11/22 > neg. Respiratory culture 11/22 >> Blood culture 11/22 >>  Antimicrobials:  Ceftriaxone 11/22 > 11/24 Azithro 11/22 > 11/24  Interim history/subjective:  No events. Bps high overnight. Admits to off label opiate use PTA and thinks he is in withdrawal.  Objective:  Blood pressure (!) 172/79, pulse 98, temperature 99.5 F (37.5 C), temperature source Oral, resp. rate 18, height 6' (1.829 m), weight (!) 142.8 kg, SpO2 97 %.        Intake/Output Summary (Last 24 hours) at 05/03/2020 0807 Last data filed at 05/03/2020 0600 Gross per 24 hour  Intake 369.23 ml  Output 7725 ml  Net -7355.77 ml   Filed Weights   04/30/20 0830 05/02/20 0315 05/03/20 0438  Weight: (!) 152 kg (!) 150.3 kg (!) 142.8 kg    Examination: Constitutional: no acute dsitress  Eyes: pupils equal, tracking Ears, nose, mouth, and throat: MMM, trachea midline Cardiovascular: RRR, ext warm Respiratory: clear, no wheezing, no accessory muscle use Gastrointestinal: Soft, +BS Skin: No rashes, normal turgor Neurologic: moves all  4 ext to command Psychiatric: fair insight   Resolved:  SVT s/p cardioversion  Assessment & Plan:  - Acute hypoxemic respiratory failure looks like most c/w flash pulmonary edema, markedly improved - Accelerated HTN exacerbated by opiate withdrawal (states he takes 120mg  oxycodone a day off label) - Class 3 obesity - Chronic pain - Steroid induced hyperglycemia  Start coreg, continue  amlodipine and cozaar Restart PTA flexeril Start low dose oxycodone PRN to prevent withdrawal PT/OT evaluation IS, flutter Hold on further lasix today Remove a line, central line, foley Stable for progressive, appreciate [facility] taking over care starting 05/04/20, remaining issues are PT/OT consult, pain control, weaning off oxygen  05/06/20 MD PCCM

## 2020-05-03 NOTE — Progress Notes (Signed)
eLink Physician-Brief Progress Note Patient Name: Wayne [name] DOB: 05/16/1976 MRN: [id number]   Date of Service  05/03/2020  HPI/Events of Note  Patient needs an order for daily Psyllium for constipation.  eICU Interventions  Order entered for daily Psyllium.        Thomasene Lot Victorhugo Preis 05/03/2020, 9:38 PM

## 2020-05-03 NOTE — Progress Notes (Signed)
RT instructed patient and family on the use of a flutter valve and incentive spirometer.  Patient able to demonstrate back good technique with both devices and able to reach 2250 mL with the incentive spirometer.

## 2020-05-04 ENCOUNTER — Other Ambulatory Visit (HOSPITAL_COMMUNITY): Payer: Self-pay | Admitting: Family Medicine

## 2020-05-04 LAB — BASIC METABOLIC PANEL
Anion gap: 13 (ref 5–15)
BUN: 28 mg/dL — ABNORMAL HIGH (ref 6–20)
CO2: 25 mmol/L (ref 22–32)
Calcium: 9.6 mg/dL (ref 8.9–10.3)
Chloride: 104 mmol/L (ref 98–111)
Creatinine, Ser: 0.92 mg/dL (ref 0.61–1.24)
GFR, Estimated: >60 mL/min (ref >60)
Glucose, Bld: 135 mg/dL — ABNORMAL HIGH (ref 70–99)
Potassium: 3.7 mmol/L (ref 3.5–5.1)
Sodium: 142 mmol/L (ref 135–145)

## 2020-05-04 LAB — PHOSPHORUS: Phosphorus: 4.2 mg/dL (ref 2.5–4.6)

## 2020-05-04 LAB — CBC
HCT: 41.2 % (ref 39.0–52.0)
Hemoglobin: 13.3 g/dL (ref 13.0–17.0)
MCH: 30.4 pg (ref 26.0–34.0)
MCHC: 32.3 g/dL (ref 30.0–36.0)
MCV: 94.1 fL (ref 80.0–100.0)
Platelets: 197 10*3/uL (ref 150–400)
RBC: 4.38 MIL/uL (ref 4.22–5.81)
RDW: 11.7 % (ref 11.5–15.5)
WBC: 7.7 10*3/uL (ref 4.0–10.5)
nRBC: 0 % (ref 0.0–0.2)

## 2020-05-04 LAB — GLUCOSE, CAPILLARY
Glucose-Capillary: 134 mg/dL — ABNORMAL HIGH (ref 70–99)
Glucose-Capillary: 108 mg/dL — ABNORMAL HIGH (ref 70–99)

## 2020-05-04 LAB — MAGNESIUM: Magnesium: 2.4 mg/dL (ref 1.7–2.4)

## 2020-05-04 MED ORDER — ENSURE ENLIVE PO LIQD
237.0000 mL | Freq: Three times a day (TID) | ORAL | Status: DC
  Administered 2020-05-04: 237 mL via ORAL

## 2020-05-04 MED ORDER — BUPRENORPHINE HCL-NALOXONE HCL 8-2 MG SL SUBL
1.0000 | SUBLINGUAL_TABLET | Freq: Two times a day (BID) | SUBLINGUAL | Status: DC
  Administered 2020-05-04: 1 via SUBLINGUAL
  Filled 2020-05-04 (×2): qty 1

## 2020-05-04 MED ORDER — LOSARTAN POTASSIUM 100 MG PO TABS: 100.0000 mg | ORAL_TABLET | Freq: Every day | ORAL | 3 refills | Status: DC

## 2020-05-04 MED ORDER — LOSARTAN POTASSIUM 50 MG PO TABS: 100.0000 mg | ORAL_TABLET | Freq: Every day | ORAL | Status: DC

## 2020-05-04 MED ORDER — CARVEDILOL 12.5 MG PO TABS
12.5000 mg | ORAL_TABLET | Freq: Two times a day (BID) | ORAL | 3 refills | Status: DC
Start: 2020-05-04 — End: 2020-05-10

## 2020-05-04 MED ORDER — BUPRENORPHINE HCL-NALOXONE HCL 8-2 MG SL SUBL: 1.0000 | SUBLINGUAL_TABLET | Freq: Two times a day (BID) | SUBLINGUAL | 0 refills | Status: DC

## 2020-05-04 MED ORDER — CARVEDILOL 12.5 MG PO TABS: 12.5000 mg | ORAL_TABLET | Freq: Two times a day (BID) | ORAL | Status: DC

## 2020-05-04 MED ORDER — ADULT MULTIVITAMIN W/MINERALS CH
1.0000 | ORAL_TABLET | Freq: Every day | ORAL | Status: DC
  Administered 2020-05-04: 1 via ORAL
  Filled 2020-05-04: qty 1

## 2020-05-04 MED ORDER — LOSARTAN POTASSIUM 25 MG PO TABS: 100.0000 mg | ORAL_TABLET | Freq: Every day | ORAL | 3 refills | Status: DC

## 2020-05-04 MED FILL — CARVEDILOL 12.5 MG TABLET: 12.5 | 30 days supply | Qty: 60 | Fill #0

## 2020-05-04 MED FILL — BUPREN-NALOX SL TAB 8-2MG: 8-2 | 7 days supply | Qty: 14 | Fill #0

## 2020-05-04 MED FILL — LOSARTAN POTASSIUM 100 MG T: 100 | 30 days supply | Qty: 30 | Fill #0

## 2020-05-04 NOTE — Discharge Summary (Signed)
Physician Discharge Summary  Quinn Quam ZOX:[id number] DOB: [birth year] DOA: 04/30/2020  PCP: Marcine Matar, MD  Admit date: 04/30/2020 Discharge date: 05/04/2020  Admitted From: Home Disposition: Home with home health  Recommendations for Outpatient Follow-up:  1. Follow up with PCP Dr. Laural Benes in 1-2 weeks 2. Dr. Laural Benes: Please obtain BMP in 1 week on new losartan  3. Dr. Laural Benes: Please review blood pressure and start diuretic if needed 4. Dr. Laural Benes: Please refer for sleep study for sleep apnea    Home Health: PT/OT due to ongoing shortness of breath with exertion Equipment/Devices: None  Discharge Condition: Good CODE STATUS: Full Diet recommendation: Cardiac  Brief/Interim Summary: Mr. Shostak is a 44 y.o. M with obesity, gout, opiate use disorder, and recent COVID who presented obtunded, requiring intubation in the ER.  Patient found at home slumped over, snoring and unresponsive, no response to Narcan with EMS.  On arrival here, pCO2 85, CXR showed bilateral infiltrates.  CT head normal. Intubated in the ER.             PRINCIPAL HOSPITAL DIAGNOSIS: Acute hypoxic respiratory failure due to flash pulmonary edema    Discharge Diagnoses:    Acute hypoxic respiratory failure due to flash pulmonary edema likely due to hypertensive emergency and opiate use Patient was admitted admitted, intubated, and mechanically ventilated for 48 hours.  During that time he was diuresed, antihypertensives were given, and had rapid improvement in his pulmonary opacities on x-ray.  The timing of his improvement in chest x-ray, suggest this was flash pulmonary edema, suspected etiology of his hypertension, opiates.  Echocardiogram showed normal EF, normal diastolic function, normal valves.  No evidence of infection.   Hypertensive emergency Essential hypertension Patient was initially treated with IV antihypertensives, transition to home amlodipine, and new carvedilol  and losartan restarted.     Obesity BMI 42  Chronic pain disorder Uses up to 120 mg oxycodone daily without prescription.  PDMP reviewed, patient has no opiate prescriptions last year.  He has tried Suboxone previously.  Here he was having mild symptoms of withdrawal, was started on Suboxone.  Outpatient Suboxone provider was arranged within 5 days.  He was discharged with close follow-up with substance use treatment facility for continued Suboxone.  Gout  AKI Resolved, due to hypertensive emergency, reaspiratory failure.  Cr 1.35 on admission, resolved now to 0.9           Discharge Instructions  Discharge Instructions    Diet - low sodium heart healthy   Complete by: As directed    Discharge instructions   Complete by: As directed    From Dr. Maryfrances Bunnell: You were admitted from lung failure. After all of your testing and treatment, our best guess is that the lung failure was from fluid in the lungs, from high blood pressure.  You were continued on your amlodipine/Norvasc, and two additional blood pressure medicines were started Take the new medicines carvedilol and losartan and follow up with your primary care doctor next week  Take carvedilol 12.5 mg twice daily (once in mornig and once at night) Take losartan 100 mg once daily  Go see Dr. Laural Benes at [facility] next week. Call them on Monday to schedule an appointment and also get labs checked next week  IMPORTANT:  Ask Dr. Laural Benes to refer you for sleep testing to test for sleep apnea and see if you qualify for a CPAP and see if it can covered with a financial assistance   For  the oxycodone use As we talked about STOP all oxycodone completely Instead take Suboxone 8-2 twice daily  Go see the suboxone clinic next Tuesday Do not take oxycodone at the same time that you are taking suboxone   Increase activity slowly   Complete by: As directed      Allergies as of 05/04/2020      Reactions    Amoxicillin Other (See Comments)   Tolerated cephalosporin 04/2020 Blisters on hands and feet Has patient had a PCN reaction causing immediate rash, facial/tongue/throat swelling, SOB or lightheadedness with hypotension: no Has patient had a PCN reaction causing severe rash involving mucus membranes or skin necrosis: no Has patient had a PCN reaction that required hospitalization: no Has patient had a PCN reaction occurring within the last 10 years: no If all of the above answers are "NO", then may proceed with Cephalosporin use.   Doxycycline Other (See Comments)   Blisters on hands and feet   Sulfamethoxazole-trimethoprim Other (See Comments)   Blisters on hands and feet      Medication List    STOP taking these medications   calcium-vitamin D 500-200 MG-UNIT tablet Commonly known as: OSCAL WITH D   clindamycin-benzoyl peroxide gel Commonly known as: BenzaClin   colchicine 0.6 MG tablet Commonly known as: Colcrys     TAKE these medications   allopurinol 300 MG tablet Commonly known as: ZYLOPRIM Take 1 tablet (300 mg total) by mouth daily.   amLODipine 10 MG tablet Commonly known as: NORVASC Take 1 tablet (10 mg total) by mouth daily.   aspirin EC 81 MG tablet Take 1 tablet (81 mg total) by mouth 2 (two) times daily.   buprenorphine-naloxone 8-2 mg Subl SL tablet Commonly known as: SUBOXONE Place 1 tablet under the tongue 2 (two) times daily.   carvedilol 12.5 MG tablet Commonly known as: COREG Take 1 tablet (12.5 mg total) by mouth 2 (two) times daily with a meal.   cyclobenzaprine 5 MG tablet Commonly known as: FLEXERIL Take 1 tablet (5 mg total) by mouth at bedtime. What changed:   when to take this  reasons to take this   losartan 100 MG tablet Commonly known as: Cozaar Take 1 tablet (100 mg total) by mouth daily. What changed:   medication strength  how much to take   multivitamin with minerals Tabs tablet Take 1 tablet by mouth daily with  breakfast.   polyethylene glycol powder 17 GM/SCOOP powder Commonly known as: GLYCOLAX/MIRALAX MIX 17 GRAMS IN LIQUID & DRINK DAILY AS NEEDED FOR CONSTIPATION What changed:   how much to take  how to take this  when to take this  additional instructions       Follow-up Information    Marcine Matar, MD. Schedule an appointment as soon as possible for a visit in 1 week(s).   Specialty: Internal Medicine Contact information: [address] [place] [address] [phone]              Allergies  Allergen Reactions  . Amoxicillin Other (See Comments)    Tolerated cephalosporin 04/2020 Blisters on hands and feet Has patient had a PCN reaction causing immediate rash, facial/tongue/throat swelling, SOB or lightheadedness with hypotension: no Has patient had a PCN reaction causing severe rash involving mucus membranes or skin necrosis: no Has patient had a PCN reaction that required hospitalization: no Has patient had a PCN reaction occurring within the last 10 years: no If all of the above answers are "NO", then  may proceed with Cephalosporin use.   Marland Kitchen Doxycycline Other (See Comments)    Blisters on hands and feet  . Sulfamethoxazole-Trimethoprim Other (See Comments)    Blisters on hands and feet    Consultations:  Critical care   Procedures/Studies: DG Chest 1 View  Result Date: 04/30/2020 CLINICAL DATA:  Hypoxia. EXAM: CHEST  1 VIEW COMPARISON:  Single-view of the chest earlier today. FINDINGS: Support tubes and lines are unchanged. Extensive bilateral airspace disease is worse on the right and has progressed since the prior exam. The exam is limited by the patient's body habitus and portable technique. Heart size appears normal. No pneumothorax is identified. IMPRESSION: Limited examination. Right greater than left airspace disease appears worse on the study earlier today. Support tubes and lines projecting good position and are unchanged. Electronically  Signed   By: Drusilla Kanner M.D.   On: 04/30/2020 14:35   CT Head Wo Contrast  Result Date: 04/30/2020 CLINICAL DATA:  Mental status change, unknown cause. EXAM: CT HEAD WITHOUT CONTRAST TECHNIQUE: Contiguous axial images were obtained from the base of the skull through the vertex without intravenous contrast. COMPARISON:  None. FINDINGS: Brain: No acute infarct, hemorrhage, or mass lesion is present. The ventricles are of normal size. No significant extraaxial fluid collection is present. No significant white matter lesions are present. The brainstem and cerebellum are within normal limits. Vascular: No hyperdense vessel or unexpected calcification. Skull: Calvarium is intact. No focal lytic or blastic lesions are present. No significant extracranial soft tissue lesion is present. Sinuses/Orbits: Mild mucosal thickening is present in the anterior ethmoid air cells and inferior right frontal sinus. The paranasal sinuses and mastoid air cells are otherwise clear. Fluid is present in the nasopharynx likely associated with intubation. The globes and orbits are within normal limits. IMPRESSION: 1. Normal CT appearance of the brain. 2. Fluid in the nasopharynx likely associated with intubation. Electronically Signed   By: Marin Roberts M.D.   On: 04/30/2020 06:55   DG Chest Port 1 View  Result Date: 05/01/2020 CLINICAL DATA:  Intubation. EXAM: PORTABLE CHEST 1 VIEW COMPARISON:  04/30/2020. FINDINGS: Endotracheal tube, NG tube, right IJ line stable position. Heart size normal. Improved aeration of both lungs noted on today's exam. Diffuse bilateral pulmonary infiltrates again noted. No pleural effusion or pneumothorax. No change thoracic spine. IMPRESSION: 1. Lines and tubes stable position. 2. Improved aeration of both lungs noted on today's exam. Diffuse bilateral pulmonary infiltrates again noted. Electronically Signed   By: Maisie Fus  Register   On: 05/01/2020 08:14   DG CHEST PORT 1 VIEW  Result  Date: 04/30/2020 CLINICAL DATA:  44 year old male central line placement. Found down. EXAM: PORTABLE CHEST 1 VIEW COMPARISON:  Portable chest 0626 hours today. FINDINGS: Portable AP semi upright view at 0957 hours. Endotracheal tube tip in good position just below the clavicles now. Enteric tube has been placed and courses to the left abdomen, tip not included. New right IJ approach central line, tip is at the SVC level of the carina. Normal cardiac size and mediastinal contours. No pneumothorax. Larger lung volumes with mildly improved ventilation. Residual perihilar, indistinct opacity. No pleural effusion is evident. IMPRESSION: 1. Right IJ approach central line tip at the SVC level. No pneumothorax. 2. Endotracheal tube tip in good position just below the clavicles now. Enteric tube courses to the left abdomen, tip not included. 3. Larger lung volumes with mildly improved ventilation. Residual perihilar opacity might be pulmonary edema. Electronically Signed   By: Rexene Edison  Margo Aye M.D.   On: 04/30/2020 10:24   DG Chest Portable 1 View  Result Date: 04/30/2020 CLINICAL DATA:  Post intubation EXAM: PORTABLE CHEST 1 VIEW COMPARISON:  01/19/2020 FINDINGS: An endotracheal tube has been placed with tip measuring 2.1 cm above the carina. Shallow inspiration. Cardiac enlargement. Areas of consolidation and airspace disease in the upper lungs bilaterally and right middle lung. Mild progression since previous study. No pleural effusions. No pneumothorax. Mediastinal contours appear intact. IMPRESSION: Endotracheal tube tip measures 2.1 cm above the carina. Cardiac enlargement. Areas of consolidation and airspace disease in the upper lungs and right middle lung with mild progression since previous study. Electronically Signed   By: Burman Nieves M.D.   On: 04/30/2020 06:33   DG Abd Portable 1 View  Result Date: 04/30/2020 CLINICAL DATA:  OG tube placement. EXAM: PORTABLE ABDOMEN - 1 VIEW COMPARISON:  None. FINDINGS:  OG tube courses below the diaphragm with the tip projecting in the peripyloric region. Nonobstructive bowel gas pattern. No overt free air on these limited portable radiographs. No abnormal calcifications or mass effect in the visualized abdomen. No acute osseous abnormality. See same day chest radiograph for evaluation of the lungs. IMPRESSION: OG tube courses below the diaphragm with the tip projecting in the peripyloric region, most likely within the proximal duodenum. Electronically Signed   By: Feliberto Harts MD   On: 04/30/2020 07:34   ECHOCARDIOGRAM COMPLETE  Result Date: 05/01/2020    ECHOCARDIOGRAM REPORT   Patient Name:   DANZEL MARSZALEK Date of Exam: 05/01/2020 Medical Rec #:  [id number]     Height:       72.0 in Accession #:    [id number]    Weight:       335.1 lb Date of Birth:  1975-12-15     BSA:          2.653 m Patient Age:    44 years      BP:           125/69 mmHg Patient Gender: M             HR:           91 bpm. Exam Location:  Inpatient Procedure: 2D Echo, Cardiac Doppler and Color Doppler Indications:    Abnormal ECG 794.31 / R94.31  History:        Patient has no prior history of Echocardiogram examinations.                 Risk Factors:Non-Smoker and Hypertension. GERD.  Sonographer:    Renella Cunas RDCS Referring Phys: [id number] Lorin Glass  Sonographer Comments: Echo performed with patient supine and on artificial respirator. IMPRESSIONS  1. Left ventricular ejection fraction, by estimation, is 55 to 60%. The left ventricle has normal function. The left ventricle has no regional wall motion abnormalities. Left ventricular diastolic parameters were normal.  2. Right ventricular systolic function is normal. The right ventricular size is normal. There is normal pulmonary artery systolic pressure. The estimated right ventricular systolic pressure is 20-25 mmHg (assuming RA pressure is 5-10 mm Hg).  3. The mitral valve is normal in structure. No evidence of mitral valve regurgitation. No  evidence of mitral stenosis.  4. The aortic valve is normal in structure. Aortic valve regurgitation is not visualized. No aortic stenosis is present. FINDINGS  Left Ventricle: Left ventricular ejection fraction, by estimation, is 55 to 60%. The left ventricle has normal function. The left ventricle has no regional wall motion abnormalities.  The left ventricular internal cavity size was normal in size. There is  no left ventricular hypertrophy. Left ventricular diastolic parameters were normal. Right Ventricle: The right ventricular size is normal. No increase in right ventricular wall thickness. Right ventricular systolic function is normal. There is normal pulmonary artery systolic pressure. The tricuspid regurgitant velocity is 1.88 m/s, and  with an assumed right atrial pressure of 5 mmHg, the estimated right ventricular systolic pressure is 19.1 mmHg. Left Atrium: Left atrial size was normal in size. Right Atrium: Right atrial size was normal in size. Pericardium: There is no evidence of pericardial effusion. Mitral Valve: The mitral valve is normal in structure. No evidence of mitral valve regurgitation. No evidence of mitral valve stenosis. Tricuspid Valve: The tricuspid valve is normal in structure. Tricuspid valve regurgitation is trivial. No evidence of tricuspid stenosis. Aortic Valve: The aortic valve is normal in structure. Aortic valve regurgitation is not visualized. No aortic stenosis is present. Pulmonic Valve: The pulmonic valve was normal in structure. Pulmonic valve regurgitation is not visualized. No evidence of pulmonic stenosis. Aorta: The aortic root is normal in size and structure. Venous: IVC assessment for right atrial pressure unable to be performed due to mechanical ventilation. IAS/Shunts: No atrial level shunt detected by color flow Doppler.  LEFT VENTRICLE PLAX 2D LVIDd:         5.40 cm      Diastology LVIDs:         3.80 cm      LV e' medial:    5.93 cm/s LV PW:         1.00 cm       LV E/e' medial:  10.4 LV IVS:        1.00 cm      LV e' lateral:   6.53 cm/s LVOT diam:     2.35 cm      LV E/e' lateral: 9.4 LV SV:         80 LV SV Index:   30 LVOT Area:     4.34 cm  LV Volumes (MOD) LV vol d, MOD A2C: 127.0 ml LV vol d, MOD A4C: 105.0 ml LV vol s, MOD A2C: 48.8 ml LV vol s, MOD A4C: 54.8 ml LV SV MOD A2C:     78.2 ml LV SV MOD A4C:     105.0 ml LV SV MOD BP:      63.2 ml RIGHT VENTRICLE RV S prime:     12.10 cm/s TAPSE (M-mode): 1.8 cm LEFT ATRIUM             Index       RIGHT ATRIUM           Index LA diam:        3.50 cm 1.32 cm/m  RA Area:     15.70 cm LA Vol (A2C):   37.0 ml 13.95 ml/m RA Volume:   35.30 ml  13.31 ml/m LA Vol (A4C):   21.8 ml 8.22 ml/m LA Biplane Vol: 26.8 ml 10.10 ml/m  AORTIC VALVE LVOT Vmax:   125.00 cm/s LVOT Vmean:  82.200 cm/s LVOT VTI:    0.184 m  AORTA Ao Root diam: 3.30 cm MITRAL VALVE               TRICUSPID VALVE MV Area (PHT): 3.42 cm    TR Peak grad:   14.1 mmHg MV Decel Time: 222 msec    TR Vmax:        188.00  cm/s MV E velocity: 61.70 cm/s MV A velocity: 49.30 cm/s  SHUNTS MV E/A ratio:  1.25        Systemic VTI:  0.18 m                            Systemic Diam: 2.35 cm Rachelle Hora Croitoru MD Electronically signed by Thurmon Fair MD Signature Date/Time: 05/01/2020/11:16:49 AM    Final    VAS [place] LOWER EXTREMITY VENOUS (DVT)  Result Date: 05/02/2020  Lower Venous DVT Study Indications: Acute hypoxic respiratory failure, concern for PE.  Comparison Study: No prior studies. Performing Technologist: Jean Rosenthal, RDMS  Examination Guidelines: A complete evaluation includes B-mode imaging, spectral Doppler, color Doppler, and power Doppler as needed of all accessible portions of each vessel. Bilateral testing is considered an integral part of a complete examination. Limited examinations for reoccurring indications may be performed as noted. The reflux portion of the exam is performed with the patient in reverse Trendelenburg.   +---------+---------------+---------+-----------+----------+--------------+ RIGHT    CompressibilityPhasicitySpontaneityPropertiesThrombus Aging +---------+---------------+---------+-----------+----------+--------------+ CFV      Full           Yes      Yes                                 +---------+---------------+---------+-----------+----------+--------------+ SFJ      Full                                                        +---------+---------------+---------+-----------+----------+--------------+ FV Prox  Full                                                        +---------+---------------+---------+-----------+----------+--------------+ FV Mid   Full                                                        +---------+---------------+---------+-----------+----------+--------------+ FV DistalFull                                                        +---------+---------------+---------+-----------+----------+--------------+ PFV      Full                                                        +---------+---------------+---------+-----------+----------+--------------+ POP      Full           Yes      Yes                                 +---------+---------------+---------+-----------+----------+--------------+  PTV      Full                                                        +---------+---------------+---------+-----------+----------+--------------+ PERO     Full                                                        +---------+---------------+---------+-----------+----------+--------------+   +---------+---------------+---------+-----------+----------+--------------+ LEFT     CompressibilityPhasicitySpontaneityPropertiesThrombus Aging +---------+---------------+---------+-----------+----------+--------------+ CFV      Full           Yes      Yes                                  +---------+---------------+---------+-----------+----------+--------------+ SFJ      Full                                                        +---------+---------------+---------+-----------+----------+--------------+ FV Prox  Full                                                        +---------+---------------+---------+-----------+----------+--------------+ FV Mid   Full                                                        +---------+---------------+---------+-----------+----------+--------------+ FV DistalFull                                                        +---------+---------------+---------+-----------+----------+--------------+ PFV      Full                                                        +---------+---------------+---------+-----------+----------+--------------+ POP      Full           Yes      Yes                                 +---------+---------------+---------+-----------+----------+--------------+ PTV      Full                                                        +---------+---------------+---------+-----------+----------+--------------+  PERO     Full                                                        +---------+---------------+---------+-----------+----------+--------------+     Summary: RIGHT: - There is no evidence of deep vein thrombosis in the lower extremity.  - No cystic structure found in the popliteal fossa.  LEFT: - There is no evidence of deep vein thrombosis in the lower extremity.  - No cystic structure found in the popliteal fossa.  *See table(s) above for measurements and observations. Electronically signed by Sherald Hess MD on 05/02/2020 at 2:59:53 PM.    Final        Subjective: Patient has no chest pain, confusion, headache, orthopnea, swelling, cough.  Discharge Exam: Vitals:   05/03/20 2100 05/04/20 0500  BP: 119/72 (!) 140/98  Pulse: 88 84  Resp: 17 (!) 21  Temp: 98.4 F (36.9  C) 98.6 F (37 C)  SpO2: 93% 93%   Vitals:   05/03/20 1600 05/03/20 1725 05/03/20 2100 05/04/20 0500  BP: (!) 152/95 (!) 137/101 119/72 (!) 140/98  Pulse: 80 87 88 84  Resp:   17 (!) 21  Temp:  99.2 F (37.3 C) 98.4 F (36.9 C) 98.6 F (37 C)  TempSrc:  Oral Oral Oral  SpO2: 95% 92% 93% 93%  Weight:    (!) 143.2 kg  Height:        General: Pt is alert, awake, not in acute distress Cardiovascular: RRR, nl S1-S2, no murmurs appreciated.   No LE edema.   Respiratory: Normal respiratory rate and rhythm.  CTAB without rales or wheezes. Abdominal: Abdomen soft and non-tender.  No distension or HSM.   Neuro/Psych: Strength symmetric in upper and lower extremities.  Judgment and insight appear normal.   The results of significant diagnostics from this hospitalization (including imaging, microbiology, ancillary and laboratory) are listed below for reference.     Microbiology: Recent Results (from the past 240 hour(s))  Respiratory Panel by RT PCR (Flu A&B, Covid) - Nasopharyngeal Swab     Status: None   Collection Time: 04/30/20  6:08 AM   Specimen: Nasopharyngeal Swab; Nasopharyngeal(NP) swabs in vial transport medium  Result Value Ref Range Status   SARS Coronavirus 2 by RT PCR NEGATIVE NEGATIVE Final    Comment: (NOTE) SARS-CoV-2 target nucleic acids are NOT DETECTED.  The SARS-CoV-2 RNA is generally detectable in upper respiratoy specimens during the acute phase of infection. The lowest concentration of SARS-CoV-2 viral copies this assay can detect is 131 copies/mL. A negative result does not preclude SARS-Cov-2 infection and should not be used as the sole basis for treatment or other patient management decisions. A negative result may occur with  improper specimen collection/handling, submission of specimen other than nasopharyngeal swab, presence of viral mutation(s) within the areas targeted by this assay, and inadequate number of viral copies (<131 copies/mL). A  negative result must be combined with clinical observations, patient history, and epidemiological information. The expected result is Negative.  Fact Sheet for Patients:  https://www.moore.com/  Fact Sheet for Healthcare Providers:  https://www.young.biz/  This test is no t yet approved or cleared by the [place] FDA and  has been authorized for detection and/or diagnosis of SARS-CoV-2 by FDA under an Emergency Use Authorization (EUA). This EUA will remain  in effect (meaning this test can be used) for the duration of the COVID-19 declaration under Section 564(b)(1) of the Act, 21 U.S.C. section 360bbb-3(b)(1), unless the authorization is terminated or revoked sooner.     Influenza A by PCR NEGATIVE NEGATIVE Final   Influenza B by PCR NEGATIVE NEGATIVE Final    Comment: (NOTE) The Xpert Xpress SARS-CoV-2/FLU/RSV assay is intended as an aid in  the diagnosis of influenza from Nasopharyngeal swab specimens and  should not be used as a sole basis for treatment. Nasal washings and  aspirates are unacceptable for Xpert Xpress SARS-CoV-2/FLU/RSV  testing.  Fact Sheet for Patients: https://www.moore.com/  Fact Sheet for Healthcare Providers: https://www.young.biz/  This test is not yet approved or cleared by the [place] FDA and  has been authorized for detection and/or diagnosis of SARS-CoV-2 by  FDA under an Emergency Use Authorization (EUA). This EUA will remain  in effect (meaning this test can be used) for the duration of the  Covid-19 declaration under Section 564(b)(1) of the Act, 21  U.S.C. section 360bbb-3(b)(1), unless the authorization is  terminated or revoked. Performed at [facility], [address] [place], [place] [address]   Blood culture (routine x 2)     Status: None (Preliminary result)   Collection Time: 04/30/20  6:11 AM   Specimen: BLOOD  Result Value Ref Range  Status   Specimen Description BLOOD BLOOD LEFT HAND  Final   Special Requests   Final    BOTTLES DRAWN AEROBIC AND ANAEROBIC Blood Culture adequate volume   Culture   Final    NO GROWTH 4 DAYS Performed at [facility], [address], [place], [place] [address]    Report Status PENDING  Incomplete  MRSA PCR Screening     Status: None   Collection Time: 04/30/20  8:34 AM   Specimen: Nasal Mucosa; Nasopharyngeal  Result Value Ref Range Status   MRSA by PCR NEGATIVE NEGATIVE Final    Comment:        The GeneXpert MRSA Assay (FDA approved for NASAL specimens only), is one component of a comprehensive MRSA colonization surveillance program. It is not intended to diagnose MRSA infection nor to guide or monitor treatment for MRSA infections. Performed at [facility], [address] [place], [place] [address]   Blood culture (routine x 2)     Status: None (Preliminary result)   Collection Time: 04/30/20 11:19 AM   Specimen: BLOOD  Result Value Ref Range Status   Specimen Description BLOOD BLOOD RIGHT HAND  Final   Special Requests   Final    BOTTLES DRAWN AEROBIC AND ANAEROBIC Blood Culture adequate volume   Culture   Final    NO GROWTH 4 DAYS Performed at [facility], [address], [place], [place] [address]    Report Status PENDING  Incomplete  Culture, blood (routine x 2)     Status: None (Preliminary result)   Collection Time: 04/30/20  1:37 PM   Specimen: BLOOD  Result Value Ref Range Status   Specimen Description BLOOD BLOOD RIGHT HAND  Final   Special Requests   Final    BOTTLES DRAWN AEROBIC AND ANAEROBIC Blood Culture results may not be optimal due to an inadequate volume of blood received in culture bottles   Culture   Final    NO GROWTH 4 DAYS Performed at [facility], [address] [place], [place] [address]    Report Status  PENDING  Incomplete  Culture, respiratory (non-expectorated)     Status: None   Collection Time: 04/30/20   4:20 PM   Specimen: Tracheal Aspirate; Respiratory  Result Value Ref Range Status   Specimen Description TRACHEAL ASPIRATE  Final   Special Requests NONE  Final   Gram Stain   Final    RARE WBC PRESENT,BOTH PMN AND MONONUCLEAR NO ORGANISMS SEEN    Culture   Final    NO GROWTH 2 DAYS Performed at [facility], [address] [place], [place] [address]    Report Status 05/03/2020 FINAL  Final     Labs: BNP (last 3 results) Recent Labs    01/21/20 0500 01/22/20 0400 04/30/20 0612  BNP 30.2 32.8 13.2   Basic Metabolic Panel: Recent Labs  Lab 04/30/20 0913 04/30/20 1403 04/30/20 1403 04/30/20 1711 05/01/20 0645 05/01/20 0759 05/02/20 0405 05/03/20 0426 05/04/20 0142  NA  --  141   < >  --  138 141 140 142 142  K  --  4.8   < >  --  4.4 4.4 4.1 4.1 3.7  CL  --  106  --   --  108  --  108 105 104  CO2  --  24  --   --  20*  --  21* 24 25  GLUCOSE  --  171*  --   --  280*  --  296* 213* 135*  BUN  --  19  --   --  20  --  25* 29* 28*  CREATININE  --  1.15  --   --  1.10  --  0.97 1.04 0.92  CALCIUM  --  8.4*  --   --  8.5*  --  8.9 9.4 9.6  MG   < >  --   --  1.6* 2.4  --  2.6* 2.2 2.4  PHOS   < >  --   --  3.0 2.6  --  1.9* 3.9 4.2   < > = values in this interval not displayed.   Liver Function Tests: Recent Labs  Lab 04/30/20 0612  AST 23  ALT 21  ALKPHOS 32*  BILITOT 0.3  PROT 3.6*  ALBUMIN 1.9*   No results for input(s): LIPASE, AMYLASE in the last 168 hours. Recent Labs  Lab 04/30/20 0612  AMMONIA 45*   CBC: Recent Labs  Lab 04/30/20 0612 04/30/20 0702 04/30/20 0913 04/30/20 1117 05/01/20 0645 05/01/20 0759 05/02/20 0405 05/03/20 0426 05/04/20 0142  WBC 4.1   < > 4.1  --  9.2  --  8.6 10.3 7.7  NEUTROABS 1.1*  --   --   --   --   --   --   --   --   HGB 8.7*   < > 14.2   < > 11.9* 12.2* 11.0* 12.7* 13.3  HCT 29.5*   < > 46.2   < > 38.4* 36.0* 35.2* 39.4 41.2  MCV 101.7*   < > 96.5  --  98.2  --  96.2 93.6 94.1  PLT 140*   < > 193   --  181  --  145* 193 197   < > = values in this interval not displayed.   Cardiac Enzymes: No results for input(s): CKTOTAL, CKMB, CKMBINDEX, TROPONINI in the last 168 hours. BNP: Invalid input(s): POCBNP CBG: Recent Labs  Lab 05/03/20 1114 05/03/20 1632 05/03/20 2059 05/04/20 0841 05/04/20 1127  GLUCAP 124* 122*  118* 134* 108*   D-Dimer No results for input(s): DDIMER in the last 72 hours. Hgb A1c No results for input(s): HGBA1C in the last 72 hours. Lipid Profile No results for input(s): CHOL, HDL, LDLCALC, TRIG, CHOLHDL, LDLDIRECT in the last 72 hours. Thyroid function studies No results for input(s): TSH, T4TOTAL, T3FREE, THYROIDAB in the last 72 hours.  Invalid input(s): FREET3 Anemia work up No results for input(s): VITAMINB12, FOLATE, FERRITIN, TIBC, IRON, RETICCTPCT in the last 72 hours. Urinalysis No results found for: COLORURINE, APPEARANCEUR, LABSPEC, PHURINE, GLUCOSEU, HGBUR, BILIRUBINUR, KETONESUR, PROTEINUR, UROBILINOGEN, NITRITE, LEUKOCYTESUR Sepsis Labs Invalid input(s): PROCALCITONIN,  WBC,  LACTICIDVEN Microbiology Recent Results (from the past 240 hour(s))  Respiratory Panel by RT PCR (Flu A&B, Covid) - Nasopharyngeal Swab     Status: None   Collection Time: 04/30/20  6:08 AM   Specimen: Nasopharyngeal Swab; Nasopharyngeal(NP) swabs in vial transport medium  Result Value Ref Range Status   SARS Coronavirus 2 by RT PCR NEGATIVE NEGATIVE Final    Comment: (NOTE) SARS-CoV-2 target nucleic acids are NOT DETECTED.  The SARS-CoV-2 RNA is generally detectable in upper respiratoy specimens during the acute phase of infection. The lowest concentration of SARS-CoV-2 viral copies this assay can detect is 131 copies/mL. A negative result does not preclude SARS-Cov-2 infection and should not be used as the sole basis for treatment or other patient management decisions. A negative result may occur with  improper specimen collection/handling, submission of  specimen other than nasopharyngeal swab, presence of viral mutation(s) within the areas targeted by this assay, and inadequate number of viral copies (<131 copies/mL). A negative result must be combined with clinical observations, patient history, and epidemiological information. The expected result is Negative.  Fact Sheet for Patients:  https://www.moore.com/  Fact Sheet for Healthcare Providers:  https://www.young.biz/  This test is no t yet approved or cleared by the [place] FDA and  has been authorized for detection and/or diagnosis of SARS-CoV-2 by FDA under an Emergency Use Authorization (EUA). This EUA will remain  in effect (meaning this test can be used) for the duration of the COVID-19 declaration under Section 564(b)(1) of the Act, 21 U.S.C. section 360bbb-3(b)(1), unless the authorization is terminated or revoked sooner.     Influenza A by PCR NEGATIVE NEGATIVE Final   Influenza B by PCR NEGATIVE NEGATIVE Final    Comment: (NOTE) The Xpert Xpress SARS-CoV-2/FLU/RSV assay is intended as an aid in  the diagnosis of influenza from Nasopharyngeal swab specimens and  should not be used as a sole basis for treatment. Nasal washings and  aspirates are unacceptable for Xpert Xpress SARS-CoV-2/FLU/RSV  testing.  Fact Sheet for Patients: https://www.moore.com/  Fact Sheet for Healthcare Providers: https://www.young.biz/  This test is not yet approved or cleared by the [place] FDA and  has been authorized for detection and/or diagnosis of SARS-CoV-2 by  FDA under an Emergency Use Authorization (EUA). This EUA will remain  in effect (meaning this test can be used) for the duration of the  Covid-19 declaration under Section 564(b)(1) of the Act, 21  U.S.C. section 360bbb-3(b)(1), unless the authorization is  terminated or revoked. Performed at [facility], [address],  [place], [place] [address]   Blood culture (routine x 2)     Status: None (Preliminary result)   Collection Time: 04/30/20  6:11 AM   Specimen: BLOOD  Result Value Ref Range Status   Specimen Description BLOOD BLOOD LEFT HAND  Final   Special Requests  Final    BOTTLES DRAWN AEROBIC AND ANAEROBIC Blood Culture adequate volume   Culture   Final    NO GROWTH 4 DAYS Performed at [facility], [address], [place], [place] [address]    Report Status PENDING  Incomplete  MRSA PCR Screening     Status: None   Collection Time: 04/30/20  8:34 AM   Specimen: Nasal Mucosa; Nasopharyngeal  Result Value Ref Range Status   MRSA by PCR NEGATIVE NEGATIVE Final    Comment:        The GeneXpert MRSA Assay (FDA approved for NASAL specimens only), is one component of a comprehensive MRSA colonization surveillance program. It is not intended to diagnose MRSA infection nor to guide or monitor treatment for MRSA infections. Performed at [facility], [address], [place], [place] [address]   Blood culture (routine x 2)     Status: None (Preliminary result)   Collection Time: 04/30/20 11:19 AM   Specimen: BLOOD  Result Value Ref Range Status   Specimen Description BLOOD BLOOD RIGHT HAND  Final   Special Requests   Final    BOTTLES DRAWN AEROBIC AND ANAEROBIC Blood Culture adequate volume   Culture   Final    NO GROWTH 4 DAYS Performed at [facility], [address], [place], [place] [address]    Report Status PENDING  Incomplete  Culture, blood (routine x 2)     Status: None (Preliminary result)   Collection Time: 04/30/20  1:37 PM   Specimen: BLOOD  Result Value Ref Range Status   Specimen Description BLOOD BLOOD RIGHT HAND  Final   Special Requests   Final    BOTTLES DRAWN AEROBIC AND ANAEROBIC Blood Culture results may not be optimal due to an inadequate volume of blood received in culture bottles   Culture   Final    NO GROWTH 4 DAYS Performed at [facility], [address] [place], [place] [address]    Report Status PENDING  Incomplete  Culture, respiratory (non-expectorated)     Status: None   Collection Time: 04/30/20  4:20 PM   Specimen: Tracheal Aspirate; Respiratory  Result Value Ref Range Status   Specimen Description TRACHEAL ASPIRATE  Final   Special Requests NONE  Final   Gram Stain   Final    RARE WBC PRESENT,BOTH PMN AND MONONUCLEAR NO ORGANISMS SEEN    Culture   Final    NO GROWTH 2 DAYS Performed at [facility], [address] [place], [place] [address]    Report Status 05/03/2020 FINAL  Final     Time coordinating discharge: 25 minutes The [place] controlled substances registry was reviewed for this patient prior to filling the <5 days supply controlled substances script.      SIGNED:   Alberteen Sam, MD  [facility] 05/04/2020, 3:39 PM

## 2020-05-04 NOTE — Progress Notes (Addendum)
Nutrition Follow-up  DOCUMENTATION CODES:   Morbid obesity  INTERVENTION:  Recommend liberalizing pt's diet to regular to provide additional kcals/protein  Ensure Enlive po TID, each supplement provides 350 kcal and 20 grams of protein  MVI daily   NUTRITION DIAGNOSIS:   Inadequate oral intake related to acute illness as evidenced by NPO status.  Progressing, pt now on heart healthy/carb modified diet  GOAL:   Patient will meet greater than or equal to 90% of their needs  progressing  MONITOR:   PO intake, Supplement acceptance, Weight trends, Labs, I & O's  REASON FOR ASSESSMENT:   Consult, Ventilator Enteral/tube feeding initiation and management  ASSESSMENT:   44 yo male admitted with AMS and acute respiratory failure/ARDS requiring intubation. Pt with recent COVID pneumonia (Aug 2021) requiring hospital stay. PMH includes EtOH abuse, B12 deficiency, HTN, iron def anemia  11/24 extubated   Pt unavailable at time of RD visit. Per CCM, pt admits to off label opiate use PTA and thinks he is in withdrawal (stated he takes 120mg  oxycodone per day). Pt has transferred to [facility] care today.    Pt has had 1 meal documented since extubation -- 75% meal completion. Pt's current diet order is too restrictive to allow pt to meet his caloric/protein needs, so will order oral nutrition supplement to provide pt with additional kcals/protein. Will also recommend liberalizing diet to regular.  Admit wt: 144 kg Current wt: 143.2 kg  UOP: [facility] x24 hours  Labs: CBGs 108-134 Medications: ss novolog at bedtime and with meals, 5 units levemir BID, miralax, metamucil  Diet Order:   Diet Order            Diet heart healthy/carb modified Room service appropriate? Yes; Fluid consistency: Thin  Diet effective now                 EDUCATION NEEDS:   Not appropriate for education at this time  Skin:  Skin Assessment: Reviewed RN Assessment  Last BM:  11/25  Height:   Ht  Readings from Last 1 Encounters:  04/30/20 6' (1.829 m)    Weight:   Wt Readings from Last 1 Encounters:  05/04/20 (!) 143.2 kg    Ideal Body Weight:  81 kg  BMI:  Body mass index is 42.82 kg/m.  Estimated Nutritional Needs:   Kcal:  2600-2800  Protein:  165-200 g  Fluid:  >/= 2 L    05/06/20, MS, RD, LDN RD pager number and weekend/on-call pager number located in Amion.

## 2020-05-04 NOTE — Progress Notes (Signed)
TOC pharmacy contacted to bring prescription to bedside. Avs provided. Education completed. No signs of distress at this time.

## 2020-05-04 NOTE — Progress Notes (Signed)
Occupational Therapy Evaluation Patient Details Name: Wayne [name] MRN: [id number] DOB: 02/23/1976 Today's Date: 05/04/2020    History of Present Illness 44 y.o. male admitted with acute hypercarbic and hypoxemic respiratory failure 2/2 ARDS. PMHx significant for COVID-19 8/21, HTN, and A-fib with RVR.    Clinical Impression   PTA patient was independent with ADLs/IADLs and was living in a single-level private residence with 1 step to bedroom. Patient reports living with his spouse and children. Patient currently presents below baseline level of function demonstrating Min guard for ADL transfers with hand held assist and no AD. Patient also limited by decreased dynamic standing balance, continued mild DOE with activity, and decreased activity tolerance. Patient maintained SpO2 >90% on RA throughout session. Patient would benefit from continued acute OT services to maximize safety and independence with self-care tasks and decrease caregiver burden in prep for safe d/c home. No recommendation for follow-up OT services at this time.     Follow Up Recommendations  No OT follow up;Supervision - Intermittent    Equipment Recommendations  None recommended by OT    Recommendations for Other Services       Precautions / Restrictions Precautions Precautions: Fall Restrictions Weight Bearing Restrictions: No      Mobility Bed Mobility Overal bed mobility: Needs Assistance Bed Mobility: Supine to Sit     Supine to sit: Min assist;HOB elevated     General bed mobility comments: increased time and use of bed rails    Transfers Overall transfer level: Needs assistance   Transfers: Sit to/from Stand Sit to Stand: Min guard         General transfer comment: Increased time, cues for upright posture and hand held assist for balance.     Balance Overall balance assessment: Needs assistance Sitting-balance support: No upper extremity supported Sitting balance-Leahy Scale: Good      Standing balance support: Single extremity supported;No upper extremity supported Standing balance-Leahy Scale: Fair                             ADL either performed or assessed with clinical judgement   ADL Overall ADL's : Needs assistance/impaired Eating/Feeding: Independent   Grooming: Min guard Grooming Details (indicate cue type and reason): Hand hygiene standing at sink level         Upper Body Dressing : Set up;Sitting       Toilet Transfer: Min Pension scheme manager Details (indicate cue type and reason): Min guard to [facility] near bedside.  Toileting- Architect and Hygiene: Min guard;Sit to/from stand Toileting - Clothing Manipulation Details (indicate cue type and reason): Min guard for hygiene/clothing management in standing. Mild DOE noted.     Functional mobility during ADLs: Min guard General ADL Comments: Min guard for short-distance ambulation in room with hand held assist for safety.      Vision Baseline Vision/History: No visual deficits Patient Visual Report: No change from baseline Vision Assessment?: No apparent visual deficits     Perception     Praxis      Pertinent Vitals/Pain Pain Assessment: 0-10 Pain Score: 2  Pain Descriptors / Indicators: Dull Pain Intervention(s): Monitored during session;Repositioned     Hand Dominance Right   Extremity/Trunk Assessment Upper Extremity Assessment Upper Extremity Assessment: Overall WFL for tasks assessed   Lower Extremity Assessment Lower Extremity Assessment: Defer to PT evaluation       Communication Communication Communication: No difficulties   Cognition Arousal/Alertness: Awake/alert Behavior During  Therapy: WFL for tasks assessed/performed Overall Cognitive Status: Within Functional Limits for tasks assessed                                     General Comments  SpO2 >92% on RA at rest and with activity.     Exercises     Shoulder Instructions       Home Living Family/patient expects to be discharged to:: Private residence Living Arrangements: Spouse/significant other Available Help at Discharge: Family;Available 24 hours/day Type of Home: House Home Access: Level entry     Home Layout: One level (1 step to bedroom)     Bathroom Shower/Tub: Chief Strategy Officer: Handicapped height Bathroom Accessibility: Yes   Home Equipment: Environmental consultant - 2 wheels;Crutches;Cane - single point;Shower seat          Prior Functioning/Environment Level of Independence: Independent                 OT Problem List: Decreased activity tolerance;Impaired balance (sitting and/or standing);Cardiopulmonary status limiting activity;Obesity;Pain      OT Treatment/Interventions: Self-care/ADL training;Therapeutic exercise;Energy conservation;DME and/or AE instruction;Therapeutic activities;Patient/family education;Balance training    OT Goals(Current goals can be found in the care plan section) Acute Rehab OT Goals Patient Stated Goal: return home OT Goal Formulation: With patient Time For Goal Achievement: 05/18/20 Potential to Achieve Goals: Good ADL Goals Pt Will Perform Grooming: with modified independence;standing Pt Will Perform Upper Body Dressing: with modified independence;sitting;standing Pt Will Perform Lower Body Dressing: with modified independence;sitting/lateral leans;sit to/from stand Pt Will Transfer to Toilet: with modified independence;ambulating Pt Will Perform Toileting - Clothing Manipulation and hygiene: with modified independence;sit to/from stand  OT Frequency: Min 2X/week   Barriers to D/C:            Co-evaluation              AM-PAC OT "6 Clicks" Daily Activity     Outcome Measure Help from another person eating meals?: None Help from another person taking care of personal grooming?: A Little Help from another person toileting, which includes using toliet, bedpan, or urinal?: A  Little Help from another person bathing (including washing, rinsing, drying)?: A Little Help from another person to put on and taking off regular upper body clothing?: A Little Help from another person to put on and taking off regular lower body clothing?: None 6 Click Score: 20   End of Session Equipment Utilized During Treatment: Gait belt  Activity Tolerance: Patient tolerated treatment well Patient left: in bed;with call bell/phone within reach;with bed alarm set;with nursing/sitter in room;with family/visitor present  OT Visit Diagnosis: Unsteadiness on feet (R26.81);Muscle weakness (generalized) (M62.81);Pain                Time: 1025-8527 OT Time Calculation (min): 24 min Charges:  OT General Charges $OT Visit: 1 Visit OT Evaluation $OT Eval Low Complexity: 1 Low OT Treatments $Self Care/Home Management : 8-22 mins  Tanicia Wolaver H. OTR/L Supplemental OT, Department of rehab services [phone]  Lee Kalt R H. 05/04/2020, 9:07 AM
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
## 2020-05-04 NOTE — TOC Transition Note (Addendum)
Transition of Care [facility]) - CM/SW Discharge Note   Patient Details  Name: Wayne [name] MRN: [id number] Date of Birth: 12-31-1975  Transition of Care [facility]) CM/SW Contact:  Kermit Balo, RN Phone Number: 05/04/2020, 1:58 PM   Clinical Narrative:    CM consulted for outpatient suboxone treatment. CM was able to talk with [facility] and find that they have an appt available on Tuesday Nov 30 th at 6 am. CM updated the patient and provided him the phone number so he could call and do interview before receiving the appt. Pt called and appt scheduled. The facility will also provide needed counseling for his substance abuse. Pt states he has transportation home when d/ced from the hospital and to the appt on Tuesday.  Pt without insurance but does attend [facility] and uses their pharmacy. CM will provide him resources on safe needle exchange.      Final next level of care: Home/Self Care Barriers to Discharge: Inadequate or no insurance, Barriers Unresolved (comment)   Patient Goals and CMS Choice     Choice offered to / list presented to : Patient  Discharge Placement                       Discharge Plan and Services                                     Social Determinants of Health (SDOH) Interventions     Readmission Risk Interventions No flowsheet data found.

## 2020-05-05 LAB — CULTURE, BLOOD (ROUTINE X 2)
Culture: NO GROWTH
Culture: NO GROWTH
Culture: NO GROWTH
Special Requests: ADEQUATE
Special Requests: ADEQUATE

## 2020-05-07 ENCOUNTER — Telehealth: Payer: Self-pay

## 2020-05-07 MED FILL — ?COLCHINCINE 0.6 MG TABS: 0.6 | 30 days supply | Qty: 30 | Fill #7

## 2020-05-07 MED FILL — ?ALLOPURINOL 300 MG TABLET: 300 | 30 days supply | Qty: 30 | Fill #2

## 2020-05-07 MED FILL — AMLODIPINE BESYLATE 10 MG T: 10 | 30 days supply | Qty: 30 | Fill #2

## 2020-05-07 NOTE — Telephone Encounter (Signed)
Transition Care Management Follow-up Telephone Call  Date of discharge and from where: 05/04/2020, [facility]   How have you been since you were released from the hospital? He said he feels" off and on."  When asked to describe that he said that he experiences occasional headaches, blurred vision   Any questions or concerns? Yes - he understands that the discharge instructions indicate to stop colchicine however he said that he was experiencing left knee pain due to gout and took 1 tablet of colchicine yesterday and 1 tablet today with some pain relief. He would like to confirm with Dr Laural Benes if he could continue to take this.   When reminding him about his appointment scheduled for tomorrow - 05/08/2020 at [facility] he said that he is not sure if he needs to go.  He said he can detox on his own after taking suboxone. Encouraged him to re-think about going as the support that the center can offer him is very beneficial and he said he would think about it.   Items Reviewed:  Did the pt receive and understand the discharge instructions provided? Yes   Medications obtained and verified? Yes he said he has all medications and did not have any questions about the med regime.   Other? No   Any new allergies since your discharge? No   Do you have support at home? Yes   Home Care and Equipment/Supplies: Were home health services ordered? no If so, what is the name of the agency? n/a  Has the agency set up a time to come to the patient's home? n/a Were any new equipment or medical supplies ordered?  No What is the name of the medical supply agency? n/a Were you able to get the supplies/equipment? N/a Do you have any questions related to the use of the equipment or supplies? No, n/a  Functional Questionnaire: (I = Independent and D = Dependent) ADLs: independent. Uses cane with ambulation  Follow up appointments reviewed:   PCP Hospital f/u appt confirmed? Yes   - Dr Laural Benes 05/10/2020   Specialist Hospital f/u appt confirmed? No   Are transportation arrangements needed? No   If their condition worsens, is the pt aware to call PCP or go to the Emergency Dept.?  yes  Was the patient provided with contact information for the PCP's office or ED? He has the clinic phone number  Was to pt encouraged to call back with questions or concerns? yes

## 2020-05-09 ENCOUNTER — Other Ambulatory Visit: Payer: Self-pay

## 2020-05-09 ENCOUNTER — Encounter: Payer: Self-pay | Admitting: Physician Assistant

## 2020-05-09 ENCOUNTER — Ambulatory Visit: Payer: Self-pay | Attending: Physician Assistant | Admitting: Physician Assistant

## 2020-05-09 VITALS — BP 132/87 | HR 83 | Temp 98.7°F | Resp 18 | Ht 71.0 in | Wt 314.0 lb

## 2020-05-09 DIAGNOSIS — H538 Other visual disturbances: Secondary | ICD-10-CM

## 2020-05-09 NOTE — Telephone Encounter (Signed)
Call placed to patient and informed him that per Dr Laural Benes :  His kidney function was good at the time of discharge from the hospital so it is okay for him to use the colchicine. I hope he did go to the Suboxone clinic as I do not prescribe Suboxone.   He said that he does not feel he needs to go to the Treatment Center for suboxone and/or counseling/support.  He said when he was told that quitting is a matter of life or death, that was all that he needed to hear and does not need additional support.  He then reported that he continues to have blurry vision. He said he can see but not clearly. He also noted that he did not have this issue when he was in the hospital. He was in agreement to coming to the clinic today to be assessed by Ms Tomi Bamberger, PA.
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
## 2020-05-09 NOTE — Progress Notes (Signed)
Patient has eaten today. Patient has taken medication today Patient denies pain at this time. Patient complains of blurred vision since being released from the hospital Patient describes vision like a "cracked screen" he was unable to view many of the letters to the right side of the snellen chart.

## 2020-05-09 NOTE — Patient Instructions (Signed)
Please check your blood pressure and pulse, keep a written log, bring log with you to your next office visit.  Make sure you stay very well-hydrated.  Please keep your office visit tomorrow with Dr. Laural Benes.  Roney Jaffe, PA-C Physician Assistant [facility] [email or web address]    Blurred Vision, Adult        Having blurred vision means that you cannot see things clearly. Your vision may seem fuzzy or out of focus. It can involve your vision for objects that are close or far away. It may affect one or both eyes. There are many causes of blurred vision, including cataracts, macular degeneration, eye inflammation (uveitis), and diabetic retinopathy. In many cases, blurred vision has to do with the shape of your eye. An abnormal eye shape means you cannot focus well (refractive error). When this happens, it can cause:  Faraway objects to look blurry (nearsightedness).  Close objects to look blurry (farsightedness).  Blurry vision at any distance (astigmatism). Refractive errors are often corrected with glasses or contacts. Blurred vision can be diagnosed based on your symptoms and a physical exam. Tell your health care provider about any other health problems you have, any recent eye injury, and any prior surgeries. You may need to see a health care provider who specializes in eye problems (ophthalmologist). Your treatment will depend on what is causing your blurred vision. Follow these instructions at home:  Keep all follow-up visits as told by your health care provider. This is important. These include any visits to your eye specialists.  Do not drive or use heavy machinery if your vision is blurry.  Use eye drops only as told by your health care provider.  If you were prescribed glasses or contact lenses, wear the glasses or contacts as told by your health care provider.  Schedule eye exams regularly.  Pay attention to  any changes in your symptoms. Contact a health care provider if:  Your symptoms do not improve or they get worse.  You have: ? New symptoms. ? A headache. ? Trouble seeing at night. ? Trouble noticing the difference between colors.  You notice: ? Drooping of your eyelids. ? Drainage coming from your eyes. ? A rash around your eyes. Get help right away if:  You have: ? Severe eye pain. ? A severe headache. ? A sudden change in vision. ? A sudden loss of vision. ? A vision change after an injury.  You notice flashing lights in your field of vision. Your field of vision is the area that you can see without moving your eyes. Summary  Having blurred vision means that you cannot see things clearly. Your vision may seem fuzzy or out of focus.  There are many causes of blurred vision. In many cases, blurred vision has to do with an abnormal eye shape (refractive error), and it can be corrected with glasses or contact lenses.  Pay attention to any changes in your symptoms. Contact a health care provider if your symptoms do not improve or if you have any new symptoms. This information is not intended to replace advice given to you by your health care provider. Make sure you discuss any questions you have with your health care provider. Document Revised: 08/20/2017 Document Reviewed: 09/12/2016 Elsevier Patient Education  2020 [employer].

## 2020-05-10 ENCOUNTER — Ambulatory Visit: Payer: Self-pay | Attending: Internal Medicine | Admitting: Internal Medicine

## 2020-05-10 ENCOUNTER — Encounter: Payer: Self-pay | Admitting: Internal Medicine

## 2020-05-10 ENCOUNTER — Other Ambulatory Visit: Payer: Self-pay | Admitting: Internal Medicine

## 2020-05-10 VITALS — BP 138/90 | HR 86 | Temp 98.3°F | Resp 16 | Wt 314.4 lb

## 2020-05-10 DIAGNOSIS — R0683 Snoring: Secondary | ICD-10-CM

## 2020-05-10 DIAGNOSIS — F1110 Opioid abuse, uncomplicated: Secondary | ICD-10-CM | POA: Insufficient documentation

## 2020-05-10 DIAGNOSIS — R079 Chest pain, unspecified: Secondary | ICD-10-CM

## 2020-05-10 DIAGNOSIS — I10 Essential (primary) hypertension: Secondary | ICD-10-CM

## 2020-05-10 DIAGNOSIS — H539 Unspecified visual disturbance: Secondary | ICD-10-CM

## 2020-05-10 DIAGNOSIS — Z09 Encounter for follow-up examination after completed treatment for conditions other than malignant neoplasm: Secondary | ICD-10-CM

## 2020-05-10 HISTORY — DX: Unspecified visual disturbance: H53.9

## 2020-05-10 HISTORY — DX: Snoring: R06.83

## 2020-05-10 HISTORY — DX: Opioid abuse, uncomplicated: F11.10

## 2020-05-10 LAB — CBC WITH DIFFERENTIAL/PLATELET
Basophils Absolute: 0 10*3/uL (ref 0.0–0.2)
Basos: 0 %
EOS (ABSOLUTE): 0.3 10*3/uL (ref 0.0–0.4)
Eos: 4 %
Hematocrit: 37.4 % — ABNORMAL LOW (ref 37.5–51.0)
Hemoglobin: 12.7 g/dL — ABNORMAL LOW (ref 13.0–17.7)
Immature Grans (Abs): 0 10*3/uL (ref 0.0–0.1)
Immature Granulocytes: 1 %
Lymphocytes Absolute: 2 10*3/uL (ref 0.7–3.1)
Lymphs: 31 %
MCH: 30.5 pg (ref 26.6–33.0)
MCHC: 34 g/dL (ref 31.5–35.7)
MCV: 90 fL (ref 79–97)
Monocytes Absolute: 0.6 10*3/uL (ref 0.1–0.9)
Monocytes: 10 %
Neutrophils Absolute: 3.5 10*3/uL (ref 1.4–7.0)
Neutrophils: 54 %
Platelets: 261 10*3/uL (ref 150–450)
RBC: 4.17 x10E6/uL (ref 4.14–5.80)
RDW: 12 % (ref 11.6–15.4)
WBC: 6.4 10*3/uL (ref 3.4–10.8)

## 2020-05-10 LAB — COMP. METABOLIC PANEL (12)
AST: 15 IU/L (ref 0–40)
Albumin/Globulin Ratio: 1.6 (ref 1.2–2.2)
Albumin: 4.1 g/dL (ref 4.0–5.0)
Alkaline Phosphatase: 62 IU/L (ref 44–121)
BUN/Creatinine Ratio: 14 (ref 9–20)
BUN: 13 mg/dL (ref 6–24)
Bilirubin Total: 0.3 mg/dL (ref 0.0–1.2)
Calcium: 9.6 mg/dL (ref 8.7–10.2)
Chloride: 104 mmol/L (ref 96–106)
Creatinine, Ser: 0.94 mg/dL (ref 0.76–1.27)
GFR calc Af Amer: 112 mL/min/{1.73_m2} (ref >59)
GFR calc non Af Amer: 97 mL/min/{1.73_m2} (ref >59)
Globulin, Total: 2.6 g/dL (ref 1.5–4.5)
Glucose: 104 mg/dL — ABNORMAL HIGH (ref 65–99)
Potassium: 4.2 mmol/L (ref 3.5–5.2)
Sodium: 141 mmol/L (ref 134–144)
Total Protein: 6.7 g/dL (ref 6.0–8.5)

## 2020-05-10 LAB — TSH: TSH: 2.04 u[IU]/mL (ref 0.450–4.500)

## 2020-05-10 MED ORDER — HYDROCHLOROTHIAZIDE 12.5 MG PO TABS: 12.5000 mg | ORAL_TABLET | Freq: Every day | ORAL | 3 refills | Status: DC

## 2020-05-10 MED FILL — HYDROCHLOROTHIAZIDE 12.5 MG: 12.5 | 30 days supply | Qty: 30 | Fill #0
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
## 2020-05-10 NOTE — Patient Instructions (Signed)
Hold off on taking carvedilol. Start hydrochlorothiazide instead.  Continue Losartan and amlodipine. Please call an ophthalmologist office and try to get an eye exam scheduled as soon as possible.  We have referred you for a sleep study. We have referred you for a CAT scan of the head.

## 2020-05-11 ENCOUNTER — Ambulatory Visit (HOSPITAL_BASED_OUTPATIENT_CLINIC_OR_DEPARTMENT_OTHER)
Admission: RE | Admit: 2020-05-11 | Discharge: 2020-05-11 | Disposition: A | Discharge status: Home / Self Care | Payer: Self-pay | Source: Ambulatory Visit | Attending: Internal Medicine | Admitting: Internal Medicine

## 2020-05-11 ENCOUNTER — Other Ambulatory Visit: Payer: Self-pay

## 2020-05-11 ENCOUNTER — Encounter: Payer: Self-pay | Admitting: Internal Medicine

## 2020-05-11 DIAGNOSIS — G43109 Migraine with aura, not intractable, without status migrainosus: Secondary | ICD-10-CM

## 2020-05-11 DIAGNOSIS — H539 Unspecified visual disturbance: Secondary | ICD-10-CM | POA: Insufficient documentation

## 2020-05-11 DIAGNOSIS — H538 Other visual disturbances: Secondary | ICD-10-CM

## 2020-05-14 ENCOUNTER — Telehealth: Payer: Self-pay | Admitting: Internal Medicine

## 2020-05-14 DIAGNOSIS — K649 Unspecified hemorrhoids: Secondary | ICD-10-CM | POA: Insufficient documentation

## 2020-05-14 DIAGNOSIS — D649 Anemia, unspecified: Secondary | ICD-10-CM | POA: Insufficient documentation

## 2020-05-14 DIAGNOSIS — M1990 Unspecified osteoarthritis, unspecified site: Secondary | ICD-10-CM | POA: Insufficient documentation

## 2020-05-14 DIAGNOSIS — Z8719 Personal history of other diseases of the digestive system: Secondary | ICD-10-CM | POA: Insufficient documentation

## 2020-05-14 DIAGNOSIS — M25461 Effusion, right knee: Secondary | ICD-10-CM | POA: Insufficient documentation

## 2020-05-14 DIAGNOSIS — K219 Gastro-esophageal reflux disease without esophagitis: Secondary | ICD-10-CM | POA: Insufficient documentation

## 2020-05-14 DIAGNOSIS — R200 Anesthesia of skin: Secondary | ICD-10-CM | POA: Insufficient documentation

## 2020-05-14 DIAGNOSIS — S82832A Other fracture of upper and lower end of left fibula, initial encounter for closed fracture: Secondary | ICD-10-CM | POA: Insufficient documentation

## 2020-05-14 NOTE — Telephone Encounter (Signed)
-----   Message from Dionne Bucy sent at 05/14/2020  8:37 AM EST ----- Regarding: RE: please get in to Ophthalmology ASAP Good Morning  Dr  Laural Benes  Patient don't have insurance .  Called patient lvm  .  Mail [facility] at low cost   ----- Message ----- From: Marcine Matar, MD Sent: 05/12/2020  10:00 AM EST To: Dionne Bucy Subject: please get in to Ophthalmology ASAP

## 2020-05-14 NOTE — Telephone Encounter (Signed)
Pt has sent a Mychart message and the message was relayed to Dr. Laural Benes regarding neuro referral

## 2020-05-14 NOTE — Telephone Encounter (Signed)
Patient is calling to report to Dr. Laural Benes was was the advise of Ophthalmology to see a neurologist to rule out migraines.Patient is requesting a referral for a nuerologist at this time. Please advise Cb- [phone]

## 2020-05-15 ENCOUNTER — Other Ambulatory Visit: Payer: Self-pay

## 2020-05-15 ENCOUNTER — Emergency Department (HOSPITAL_COMMUNITY): Payer: Self-pay

## 2020-05-15 ENCOUNTER — Encounter (HOSPITAL_COMMUNITY): Payer: Self-pay | Admitting: Emergency Medicine

## 2020-05-15 ENCOUNTER — Emergency Department (HOSPITAL_COMMUNITY)
Admission: EM | Admit: 2020-05-15 | Discharge: 2020-05-16 | Disposition: A | Discharge status: Home / Self Care | Payer: Self-pay | Attending: Emergency Medicine | Admitting: Emergency Medicine

## 2020-05-15 ENCOUNTER — Ambulatory Visit (INDEPENDENT_AMBULATORY_CARE_PROVIDER_SITE_OTHER): Payer: Self-pay | Admitting: Cardiology

## 2020-05-15 ENCOUNTER — Encounter: Payer: Self-pay | Admitting: Cardiology

## 2020-05-15 ENCOUNTER — Encounter: Payer: Self-pay | Admitting: Neurology

## 2020-05-15 ENCOUNTER — Ambulatory Visit (INDEPENDENT_AMBULATORY_CARE_PROVIDER_SITE_OTHER): Payer: Self-pay

## 2020-05-15 VITALS — BP 136/78 | HR 92 | Ht 71.0 in | Wt 314.1 lb

## 2020-05-15 DIAGNOSIS — R519 Headache, unspecified: Secondary | ICD-10-CM | POA: Insufficient documentation

## 2020-05-15 DIAGNOSIS — I480 Paroxysmal atrial fibrillation: Secondary | ICD-10-CM

## 2020-05-15 DIAGNOSIS — Z7982 Long term (current) use of aspirin: Secondary | ICD-10-CM | POA: Insufficient documentation

## 2020-05-15 DIAGNOSIS — Z8616 Personal history of COVID-19: Secondary | ICD-10-CM | POA: Insufficient documentation

## 2020-05-15 DIAGNOSIS — I10 Essential (primary) hypertension: Secondary | ICD-10-CM

## 2020-05-15 DIAGNOSIS — H539 Unspecified visual disturbance: Secondary | ICD-10-CM

## 2020-05-15 DIAGNOSIS — H538 Other visual disturbances: Secondary | ICD-10-CM | POA: Insufficient documentation

## 2020-05-15 DIAGNOSIS — Z79899 Other long term (current) drug therapy: Secondary | ICD-10-CM | POA: Insufficient documentation

## 2020-05-15 DIAGNOSIS — F1010 Alcohol abuse, uncomplicated: Secondary | ICD-10-CM

## 2020-05-15 HISTORY — DX: Paroxysmal atrial fibrillation: I48.0

## 2020-05-15 HISTORY — DX: Morbid (severe) obesity due to excess calories: E66.01

## 2020-05-15 NOTE — ED Notes (Signed)
Pt is in CT, called CT, will bring to triage for EKG when CT complete
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
## 2020-05-15 NOTE — ED Notes (Signed)
Pt waiting outside

## 2020-05-15 NOTE — Patient Instructions (Signed)
Medication Instructions:  No medication changes. *If you need a refill on your cardiac medications before your next appointment, please call your pharmacy*   Lab Work: None ordered If you have labs (blood work) drawn today and your tests are completely normal, you will receive your results only by: Marland Kitchen MyChart Message (if you have MyChart) OR . A paper copy in the mail If you have any lab test that is abnormal or we need to change your treatment, we will call you to review the results.   Testing/Procedures: Your physician has requested that you have a lexiscan myoview. For further information please visit [email or web address] Please follow instruction sheet, as given.  The test will done over 2 days and take approximately 3 to 4 hours each day to complete; you may bring reading material.  If someone comes with you to your appointment, they will need to remain in the main lobby due to limited space in the testing area.   How to prepare for your Myocardial Perfusion Test: . Do not eat or drink 3 hours prior to your test, except you may have water. . Do not consume products containing caffeine (regular or decaffeinated) 12 hours prior to your test. (ex: coffee, chocolate, sodas, tea). . Do bring a list of your current medications with you.  If not listed below, you may take your medications as normal. . Do wear comfortable clothes (no dresses or overalls) and walking shoes, tennis shoes preferred (No heels or open toe shoes are allowed). . Do NOT wear cologne, perfume, aftershave, or lotions (deodorant is allowed). . If these instructions are not followed, your test will have to be rescheduled.   WHY IS MY DOCTOR PRESCRIBING ZIO? The Zio system is proven and trusted by physicians to detect and diagnose irregular heart rhythms -- and has been prescribed to hundreds of thousands of patients.  The FDA has cleared the Zio system to monitor for many different kinds of irregular heart rhythms. In  a study, physicians were able to reach a diagnosis 90% of the time with the Zio system1.  You can wear the Zio monitor -- a small, discreet, comfortable patch -- during your normal day-to-day activity, including while you sleep, shower, and exercise, while it records every single heartbeat for analysis.  1Barrett, P., et al. Comparison of 24 Hour Holter Monitoring Versus 14 Day Novel Adhesive Patch Electrocardiographic Monitoring. American Journal of Medicine, 2014.  ZIO VS. HOLTER MONITORING The Zio monitor can be comfortably worn for up to 14 days. Holter monitors can be worn for 24 to 48 hours, limiting the time to record any irregular heart rhythms you may have. Zio is able to capture data for the 51% of patients who have their first symptom-triggered arrhythmia after 48 hours.1  LIVE WITHOUT RESTRICTIONS The Zio ambulatory cardiac monitor is a small, unobtrusive, and water-resistant patch--you might even forget you're wearing it. The Zio monitor records and stores every beat of your heart, whether you're sleeping, working out, or showering. Wear the monitor for 14 days, remove 05/29/20.   Follow-Up: At [facility], you and your health needs are our priority.  As part of our continuing mission to provide you with exceptional heart care, we have created designated Provider Care Teams.  These Care Teams include your primary Cardiologist (physician) and Advanced Practice Providers (APPs -  Physician Assistants and Nurse Practitioners) who all work together to provide you with the care you need, when you need it.  We recommend signing up for the  patient portal called "MyChart".  Sign up information is provided on this After Visit Summary.  MyChart is used to connect with patients for Virtual Visits (Telemedicine).  Patients are able to view lab/test results, encounter notes, upcoming appointments, etc.  Non-urgent messages can be sent to your provider as well.   To learn more about what you can  do with MyChart, go to [email or web address].    Your next appointment:   1 month(s)  The format for your next appointment:   In Person  Provider:   Belva Crome, MD   Other Instructions  Cardiac Nuclear Scan A cardiac nuclear scan is a test that is done to check the flow of blood to your heart. It is done when you are resting and when you are exercising. The test looks for problems such as:  Not enough blood reaching a portion of the heart.  The heart muscle not working as it should. You may need this test if:  You have heart disease.  You have had lab results that are not normal.  You have had heart surgery or a balloon procedure to open up blocked arteries (angioplasty).  You have chest pain.  You have shortness of breath. In this test, a special dye (tracer) is put into your bloodstream. The tracer will travel to your heart. A camera will then take pictures of your heart to see how the tracer moves through your heart. This test is usually done at a hospital and takes 2-4 hours. Tell a doctor about:  Any allergies you have.  All medicines you are taking, including vitamins, herbs, eye drops, creams, and over-the-counter medicines.  Any problems you or family members have had with anesthetic medicines.  Any blood disorders you have.  Any surgeries you have had.  Any medical conditions you have.  Whether you are pregnant or may be pregnant. What are the risks? Generally, this is a safe test. However, problems may occur, such as:  Serious chest pain and heart attack. This is only a risk if the stress portion of the test is done.  Rapid heartbeat.  A feeling of warmth in your chest. This feeling usually does not last long.  Allergic reaction to the tracer. What happens before the test?  Ask your doctor about changing or stopping your normal medicines. This is important.  Follow instructions from your doctor about what you cannot eat or drink.  Remove  your jewelry on the day of the test. What happens during the test?  An IV tube will be inserted into one of your veins.  Your doctor will give you a small amount of tracer through the IV tube.  You will wait for 20-40 minutes while the tracer moves through your bloodstream.  Your heart will be monitored with an electrocardiogram (ECG).  You will lie down on an exam table.  Pictures of your heart will be taken for about 15-20 minutes.  You may also have a stress test. For this test, one of these things may be done: ? You will be asked to exercise on a treadmill or a stationary bike. ? You will be given medicines that will make your heart work harder. This is done if you are unable to exercise.  When blood flow to your heart has peaked, a tracer will again be given through the IV tube.  After 20-40 minutes, you will get back on the exam table. More pictures will be taken of your heart.  Depending on the tracer that  is used, more pictures may need to be taken 3-4 hours later.  Your IV tube will be removed when the test is over. The test may vary among doctors and hospitals. What happens after the test?  Ask your doctor: ? Whether you can return to your normal schedule, including diet, activities, and medicines. ? Whether you should drink more fluids. This will help to remove the tracer from your body. Drink enough fluid to keep your pee (urine) pale yellow.  Ask your doctor, or the department that is doing the test: ? When will my results be ready? ? How will I get my results? Summary  A cardiac nuclear scan is a test that is done to check the flow of blood to your heart.  Tell your doctor whether you are pregnant or may be pregnant.  Before the test, ask your doctor about changing or stopping your normal medicines. This is important.  Ask your doctor whether you can return to your normal activities. You may be asked to drink more fluids. This information is not intended to  replace advice given to you by your health care provider. Make sure you discuss any questions you have with your health care provider. Document Revised: 09/15/2018 Document Reviewed: 11/09/2017 Elsevier Patient Education  2020 [employer].

## 2020-05-15 NOTE — ED Triage Notes (Signed)
Pt. Stated, Wayne [name] had like flashing lights in my eyes since Nov, 29, 2021 . I went to a eye Dr. Royetta Car is wrong Dr. It was an optometrist not an opthalmologic. Told me to see a neurologist.

## 2020-05-16 ENCOUNTER — Emergency Department (HOSPITAL_COMMUNITY): Payer: Self-pay

## 2020-05-16 LAB — BASIC METABOLIC PANEL
Anion gap: 10 (ref 5–15)
BUN: 11 mg/dL (ref 6–20)
CO2: 24 mmol/L (ref 22–32)
Calcium: 9.4 mg/dL (ref 8.9–10.3)
Chloride: 106 mmol/L (ref 98–111)
Creatinine, Ser: 0.8 mg/dL (ref 0.61–1.24)
GFR, Estimated: >60 mL/min (ref >60)
Glucose, Bld: 122 mg/dL — ABNORMAL HIGH (ref 70–99)
Potassium: 3.8 mmol/L (ref 3.5–5.1)
Sodium: 140 mmol/L (ref 135–145)

## 2020-05-16 LAB — CBC
HCT: 38.8 % — ABNORMAL LOW (ref 39.0–52.0)
Hemoglobin: 12.2 g/dL — ABNORMAL LOW (ref 13.0–17.0)
MCH: 29.8 pg (ref 26.0–34.0)
MCHC: 31.4 g/dL (ref 30.0–36.0)
MCV: 94.6 fL (ref 80.0–100.0)
Platelets: 240 10*3/uL (ref 150–400)
RBC: 4.1 MIL/uL — ABNORMAL LOW (ref 4.22–5.81)
RDW: 12 % (ref 11.5–15.5)
WBC: 4.4 10*3/uL (ref 4.0–10.5)
nRBC: 0 % (ref 0.0–0.2)

## 2020-05-16 MED ORDER — PROCHLORPERAZINE EDISYLATE 10 MG/2ML IJ SOLN
10.0000 mg | Freq: Once | INTRAMUSCULAR | Status: AC
  Administered 2020-05-16: 10 mg via INTRAVENOUS
  Filled 2020-05-16: qty 2

## 2020-05-16 MED ORDER — SODIUM CHLORIDE 0.9 % IV BOLUS
1000.0000 mL | Freq: Once | INTRAVENOUS | Status: AC
  Administered 2020-05-16: 1000 mL via INTRAVENOUS

## 2020-05-16 MED ORDER — TETRACAINE HCL 0.5 % OP SOLN
2.0000 [drp] | Freq: Once | OPHTHALMIC | Status: AC
  Administered 2020-05-16: 2 [drp] via OPHTHALMIC
  Filled 2020-05-16: qty 4

## 2020-05-16 MED ORDER — DIPHENHYDRAMINE HCL 50 MG/ML IJ SOLN
25.0000 mg | Freq: Once | INTRAMUSCULAR | Status: AC
  Administered 2020-05-16: 25 mg via INTRAVENOUS
  Filled 2020-05-16: qty 1

## 2020-05-16 NOTE — Discharge Instructions (Addendum)
°  The MRI today did not show any signs of stroke or abnormalities in the brain.  Follow up with Dr Fredderick Phenix [phone].  Call to schedule an appointment

## 2020-05-16 NOTE — ED Notes (Signed)
Visual acuity test performed w MD Pollina at bedside R eye: 20/10 L eye: 20/10

## 2020-05-16 NOTE — ED Provider Notes (Signed)
Patient was initially seen by Dr. Blinda Leatherwood.  Please see his note.  Patient was being evaluated for a visual disturbance.  Plan was for MRI and outpatient follow-up with ophthalmology if MRI was negative.  Patient did have an external cardiac monitoring device.  We did remove this temporarily in order for him to get his MRI.  MRI does not show any acute findings.  Plan on outpatient follow-up with ophthalmology.   Linwood Dibbles, MD 05/16/20 1216

## 2020-05-16 NOTE — Telephone Encounter (Signed)
Patient verified DOB Patient is aware of labs being normal.  No further concerns.

## 2020-05-16 NOTE — ED Notes (Signed)
Pt in MRI at this time 

## 2020-05-16 NOTE — ED Notes (Signed)
MRI called, stating pt unable to do MRI due to external heart monitor in place, EDP made aware and monitor removed so pt can get MRI.

## 2020-05-16 NOTE — ED Notes (Signed)
This RN attempted IV access twice, unsuccessful. Another RN to attempt
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
## 2020-05-16 NOTE — ED Notes (Signed)
Pt in MRI.

## 2020-05-16 NOTE — Telephone Encounter (Signed)
-----   Message from Roney Jaffe, [place] sent at 05/14/2020  1:26 PM EST ----- Please call patient and let him know that his kidney and liver function and thyroid function were within normal limits

## 2020-05-17 ENCOUNTER — Other Ambulatory Visit: Payer: Self-pay | Admitting: Cardiology

## 2020-05-17 ENCOUNTER — Telehealth: Payer: Self-pay | Admitting: Cardiology

## 2020-05-17 ENCOUNTER — Other Ambulatory Visit (HOSPITAL_COMMUNITY): Payer: Self-pay | Admitting: Cardiology

## 2020-05-17 NOTE — Telephone Encounter (Signed)
° ° °  Pt wearing zio heart monitor he went to ED and when getting MRI they removed the heart monitor. He said they put back the heart monitor and wanted to make sure that it is connected and working

## 2020-05-17 NOTE — Telephone Encounter (Signed)
Spoke with patient and advised that as long as no lights were on the monitor should be fine but he could call the zio company for further recommendation. Pt verbalized understanding and had no other questions.

## 2020-05-22 NOTE — Addendum Note (Signed)
Addended by: Eleonore Chiquito on: 05/22/2020 09:10 AM   Modules accepted: Orders

## 2020-05-23 ENCOUNTER — Telehealth (HOSPITAL_COMMUNITY): Payer: Self-pay | Admitting: *Deleted

## 2020-05-23 NOTE — Telephone Encounter (Signed)
Patient given detailed instructions per Myocardial Perfusion Study Information Sheet for the test on 1220/21 at 8:00. Patient notified to arrive 15 minutes early and that it is imperative to arrive on time for appointment to keep from having the test rescheduled.  If you need to cancel or reschedule your appointment, please call the office within 24 hours of your appointment. . Patient verbalized understanding.Daneil Dolin

## 2020-05-24 ENCOUNTER — Other Ambulatory Visit: Payer: Self-pay

## 2020-05-24 ENCOUNTER — Other Ambulatory Visit: Payer: Self-pay | Admitting: Internal Medicine

## 2020-05-24 ENCOUNTER — Encounter: Payer: Self-pay | Admitting: Internal Medicine

## 2020-05-24 ENCOUNTER — Ambulatory Visit: Payer: Self-pay | Attending: Internal Medicine | Admitting: Internal Medicine

## 2020-05-24 VITALS — BP 124/84 | HR 85 | Temp 98.2°F | Resp 16 | Wt 318.2 lb

## 2020-05-24 DIAGNOSIS — F321 Major depressive disorder, single episode, moderate: Secondary | ICD-10-CM | POA: Insufficient documentation

## 2020-05-24 DIAGNOSIS — R519 Headache, unspecified: Secondary | ICD-10-CM

## 2020-05-24 DIAGNOSIS — H539 Unspecified visual disturbance: Secondary | ICD-10-CM

## 2020-05-24 DIAGNOSIS — I10 Essential (primary) hypertension: Secondary | ICD-10-CM

## 2020-05-24 HISTORY — DX: Major depressive disorder, single episode, moderate: F32.1

## 2020-05-24 HISTORY — DX: Headache, unspecified: R51.9

## 2020-05-24 MED ORDER — SERTRALINE HCL 50 MG PO TABS: ORAL_TABLET | ORAL | 3 refills | Status: DC

## 2020-05-24 MED FILL — SERTRALINE HCL 50 MG TABLET: 50 | 40 days supply | Qty: 30 | Fill #0

## 2020-05-24 NOTE — Patient Instructions (Addendum)
Sign a release for [place] to get records from the ophthalmologist you saw. I recommend getting your second COVID vaccine. Start the Zoloft for depression as discussed.   Obesity, Adult Obesity is having too much body fat. Being obese means that your weight is more than what is healthy for you. BMI is a number that explains how much body fat you have. If you have a BMI of 30 or more, you are obese. Obesity is often caused by eating or drinking more calories than your body uses. Changing your lifestyle can help you lose weight. Obesity can cause serious health problems, such as:  Stroke.  Coronary artery disease (CAD).  Type 2 diabetes.  Some types of cancer, including cancers of the colon, breast, uterus, and gallbladder.  Osteoarthritis.  High blood pressure (hypertension).  High cholesterol.  Sleep apnea.  Gallbladder stones.  Infertility problems. What are the causes?  Eating meals each day that are high in calories, sugar, and fat.  Being born with genes that may make you more likely to become obese.  Having a medical condition that causes obesity.  Taking certain medicines.  Sitting a lot (having a sedentary lifestyle).  Not getting enough sleep.  Drinking a lot of drinks that have sugar in them. What increases the risk?  Having a family history of obesity.  Being an [place] American woman.  Being a Hispanic man.  Living in an area with limited access to: ? [place] [facility], recreation centers, or sidewalks. ? Healthy food choices, such as grocery stores and farmers' markets. What are the signs or symptoms? The main sign is having too much body fat. How is this treated?  Treatment for this condition often includes changing your lifestyle. Treatment may include: ? Changing your diet. This may include making a healthy meal plan. ? Exercise. This may include activity that causes your heart to beat faster (aerobic exercise) and strength training. Work with your doctor to  design a program that works for you. ? Medicine to help you lose weight. This may be used if you are not able to lose 1 pound a week after 6 weeks of healthy eating and more exercise. ? Treating conditions that cause the obesity. ? Surgery. Options may include gastric banding and gastric bypass. This may be done if:  Other treatments have not helped to improve your condition.  You have a BMI of 40 or higher.  You have life-threatening health problems related to obesity. Follow these instructions at home: Eating and drinking   Follow advice from your doctor about what to eat and drink. Your doctor may tell you to: ? Limit fast food, sweets, and processed snack foods. ? Choose low-fat options. For example, choose low-fat milk instead of whole milk. ? Eat 5 or more servings of fruits or vegetables each day. ? Eat at home more often. This gives you more control over what you eat. ? Choose healthy foods when you eat out. ? Learn to read food labels. This will help you learn how much food is in 1 serving. ? Keep low-fat snacks available. ? Avoid drinks that have a lot of sugar in them. These include soda, fruit juice, iced tea with sugar, and flavored milk.  Drink enough water to keep your pee (urine) pale yellow.  Do not go on fad diets. Physical activity  Exercise often, as told by your doctor. Most adults should get up to 150 minutes of moderate-intensity exercise every week.Ask your doctor: ? What types of exercise are  safe for you. ? How often you should exercise.  Warm up and stretch before being active.  Do slow stretching after being active (cool down).  Rest between times of being active. Lifestyle  Work with your doctor and a food expert (dietitian) to set a weight-loss goal that is best for you.  Limit your screen time.  Find ways to reward yourself that do not involve food.  Do not drink alcohol if: ? Your doctor tells you not to drink. ? You are pregnant, may be  pregnant, or are planning to become pregnant.  If you drink alcohol: ? Limit how much you use to:  0-1 drink a day for women.  0-2 drinks a day for men. ? Be aware of how much alcohol is in your drink. In the [place] one drink equals one 12 oz bottle of beer (355 mL), one 5 oz glass of wine (148 mL), or one 1 oz glass of hard liquor (44 mL). General instructions  Keep a weight-loss journal. This can help you keep track of: ? The food that you eat. ? How much exercise you get.  Take over-the-counter and prescription medicines only as told by your doctor.  Take vitamins and supplements only as told by your doctor.  Think about joining a support group.  Keep all follow-up visits as told by your doctor. This is important. Contact a doctor if:  You cannot meet your weight loss goal after you have changed your diet and lifestyle for 6 weeks. Get help right away if you:  Are having trouble breathing.  Are having thoughts of harming yourself. Summary  Obesity is having too much body fat.  Being obese means that your weight is more than what is healthy for you.  Work with your doctor to set a weight-loss goal.  Get regular exercise as told by your doctor. This information is not intended to replace advice given to you by your health care provider. Make sure you discuss any questions you have with your health care provider. Document Revised: 01/28/2018 Document Reviewed: 01/28/2018 Elsevier Patient Education  2020 [employer].
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
## 2020-05-25 NOTE — Addendum Note (Signed)
Addended by: Belva Crome R on: 05/25/2020 01:56 PM   Modules accepted: Orders

## 2020-05-28 ENCOUNTER — Ambulatory Visit (HOSPITAL_COMMUNITY): Payer: Self-pay | Attending: Cardiology

## 2020-05-28 ENCOUNTER — Other Ambulatory Visit: Payer: Self-pay

## 2020-05-28 DIAGNOSIS — I480 Paroxysmal atrial fibrillation: Secondary | ICD-10-CM | POA: Insufficient documentation

## 2020-05-28 MED ORDER — REGADENOSON 0.4 MG/5ML IV SOLN
0.4000 mg | Freq: Once | INTRAVENOUS | Status: AC
  Administered 2020-05-28: 0.4 mg via INTRAVENOUS

## 2020-05-28 MED ORDER — TECHNETIUM TC 99M TETROFOSMIN IV KIT
30.3000 | PACK | Freq: Once | INTRAVENOUS | Status: AC | PRN
  Administered 2020-05-28: 30.3 via INTRAVENOUS
  Filled 2020-05-28: qty 31

## 2020-05-29 ENCOUNTER — Ambulatory Visit (HOSPITAL_COMMUNITY): Payer: Self-pay | Attending: Cardiology

## 2020-05-29 LAB — MYOCARDIAL PERFUSION IMAGING
LV dias vol: 115 mL (ref 62–150)
LV sys vol: 51 mL
Peak HR: 111 {beats}/min
Rest HR: 93 {beats}/min
SDS: 0
SRS: 0
SSS: 0
TID: 0.91

## 2020-05-29 MED ORDER — TECHNETIUM TC 99M TETROFOSMIN IV KIT
31.1000 | PACK | Freq: Once | INTRAVENOUS | Status: AC | PRN
  Administered 2020-05-29: 31.1 via INTRAVENOUS
  Filled 2020-05-29: qty 32

## 2020-05-30 MED FILL — LOSARTAN POTASSIUM 100 MG T: 100 | 30 days supply | Qty: 30 | Fill #0
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
## 2020-06-04 ENCOUNTER — Other Ambulatory Visit: Payer: Self-pay

## 2020-06-04 ENCOUNTER — Ambulatory Visit (INDEPENDENT_AMBULATORY_CARE_PROVIDER_SITE_OTHER): Payer: Self-pay | Admitting: Neurology

## 2020-06-04 ENCOUNTER — Encounter: Payer: Self-pay | Admitting: Neurology

## 2020-06-04 ENCOUNTER — Other Ambulatory Visit: Payer: Self-pay | Admitting: Neurology

## 2020-06-04 VITALS — BP 145/85 | HR 94 | Ht 71.0 in | Wt 325.2 lb

## 2020-06-04 DIAGNOSIS — R519 Headache, unspecified: Secondary | ICD-10-CM

## 2020-06-04 DIAGNOSIS — G43519 Persistent migraine aura without cerebral infarction, intractable, without status migrainosus: Secondary | ICD-10-CM

## 2020-06-04 MED ORDER — TOPIRAMATE 25 MG PO TABS: ORAL_TABLET | ORAL | 0 refills | Status: DC

## 2020-06-04 NOTE — Patient Instructions (Signed)
1.  Take topiramate 25mg  tablet.  Take 1 tablet at bedtime for one week, then increase to 2 tablets at bedtime.  Contact me with update and refill (we can increase dose if needed) 2.  Limit use of pain relievers to no more than 2 days out of week to prevent risk of rebound or medication-overuse headache. 3.  Follow up 6 months.

## 2020-06-05 ENCOUNTER — Other Ambulatory Visit: Payer: Self-pay | Admitting: Internal Medicine

## 2020-06-05 ENCOUNTER — Other Ambulatory Visit: Payer: Self-pay | Admitting: Family

## 2020-06-05 DIAGNOSIS — I10 Essential (primary) hypertension: Secondary | ICD-10-CM

## 2020-06-05 DIAGNOSIS — M109 Gout, unspecified: Secondary | ICD-10-CM

## 2020-06-05 MED ORDER — ALLOPURINOL 300 MG PO TABS: 300.0000 mg | ORAL_TABLET | Freq: Every day | ORAL | 2 refills | Status: DC

## 2020-06-05 MED ORDER — AMLODIPINE BESYLATE 10 MG PO TABS: 10.0000 mg | ORAL_TABLET | Freq: Every day | ORAL | 0 refills | Status: DC

## 2020-06-05 MED ORDER — HYDROCHLOROTHIAZIDE 12.5 MG PO TABS: 12.5000 mg | ORAL_TABLET | Freq: Every day | ORAL | 3 refills | Status: DC

## 2020-06-05 MED FILL — TOPIRAMATE 25 MG TABS: 25 | 28 days supply | Qty: 49 | Fill #0

## 2020-06-05 MED FILL — AMLODIPINE BESYLATE 10 MG T: 10 | 30 days supply | Qty: 30 | Fill #0

## 2020-06-05 MED FILL — HYDROCHLOROTHIAZIDE 12.5 MG: 12.5 | 30 days supply | Qty: 30 | Fill #1

## 2020-06-05 MED FILL — ?ALLOPURINOL 300 MG TABLET: 300 | 30 days supply | Qty: 30 | Fill #2

## 2020-06-05 NOTE — Telephone Encounter (Signed)
Medication Refill - Medication: Allopurinol, hydrochlorothiazide, amlodipine   Has the patient contacted their pharmacy? Yes.   Pt called stating she is completely out of these medications. Please advise.  (Agent: If no, request that the patient contact the pharmacy for the refill.) (Agent: If yes, when and what did the pharmacy advise?)  Preferred Pharmacy (with phone number or street name):  [facility] - [place], [place] - [address]  [address] [place] [address]  Phone: [phone] Fax: [phone]  Hours: Not open 24 hours     Agent: Please be advised that RX refills may take up to 3 business days. We ask that you follow-up with your pharmacy.

## 2020-06-13 ENCOUNTER — Ambulatory Visit: Payer: Self-pay | Attending: Internal Medicine | Admitting: Licensed Clinical Social Worker

## 2020-06-13 DIAGNOSIS — F4323 Adjustment disorder with mixed anxiety and depressed mood: Secondary | ICD-10-CM

## 2020-06-15 ENCOUNTER — Other Ambulatory Visit: Payer: Self-pay

## 2020-06-26 ENCOUNTER — Ambulatory Visit: Payer: Self-pay | Admitting: Neurology

## 2020-06-26 MED FILL — COLCHICINE 0.6 MG TABS: 0.6 | 30 days supply | Qty: 30 | Fill #8

## 2020-06-26 NOTE — BH Specialist Note (Signed)
Integrated Behavioral Health via Telemedicine Visit  06/13/2020 Wayne [name] [id number]  Number of Integrated Behavioral Health visits: 1 Session Start time: 3:30 PM  Session End time: 3:50 PM Total time: 20  Referring Provider: Dr. Laural Benes Patient/Family location: Home [facility] Provider location: Office All persons participating in visit: LCSW and Patient Types of Service: Individual psychotherapy  I connected with Wayne [name] by Telephone  (Video is Surveyor, mining) and verified that I am speaking with the correct person using two identifiers.Discussed confidentiality: Yes   I discussed the limitations of telemedicine and the availability of in person appointments.  Discussed there is a possibility of technology failure and discussed alternative modes of communication if that failure occurs.  I discussed that engaging in this telemedicine visit, they consent to the provision of behavioral healthcare and the services will be billed under their insurance.  Patient and/or legal guardian expressed understanding and consented to Telemedicine visit: Yes   Presenting Concerns: Patient and/or family reports the following symptoms/concerns: Pt reports decrease in depression symptoms since the renewal of sight (2 weeks ago) Duration of problem: December 2021; Severity of problem: mild  Patient and/or Family's Strengths/Protective Factors: Social connections, Social and Emotional competence, Concrete supports in place (healthy food, safe environments, etc.) and Sense of purpose  Goals Addressed: Patient will: 1.  Increase knowledge and/or ability of: stress reduction Pt agreed to utilize stress management strategies (prioritizing rest, watching tv, and completing errands)  Progress towards Goals: Ongoing  Interventions: Interventions utilized:  Solution-Focused Strategies, Supportive Counseling and Psychoeducation and/or Health Education Standardized Assessments completed: Not  Needed  Patient Response: Pt was engaged in session and was successful in identifying healthy strategies to manage symptoms  Assessment: Patient currently experiencing decrease in depression symptoms, initiated after having decrease in vision. Vision has been restored approximately 2 weeks ago, which has improved pt's mental health (per pt)   Patient may benefit from stress management strategies identified. Pt reports that he discontinued zoloft after one day and is not interested in medication management at this time.  Plan: 1. Follow up with behavioral health clinician on : Contact LCSW with any additional behavioral health and/or resource needs 2. Behavioral recommendations: Utilize strategies discussed 3. Referral(s): Integrated [employer] (In Clinic)  I discussed the assessment and treatment plan with the patient and/or parent/guardian. They were provided an opportunity to ask questions and all were answered. They agreed with the plan and demonstrated an understanding of the instructions.   They were advised to call back or seek an in-person evaluation if the symptoms worsen or if the condition fails to improve as anticipated.  Bridgett Larsson, LCSW  06/26/2020 10:15 AM

## 2020-07-02 ENCOUNTER — Other Ambulatory Visit: Payer: Self-pay | Admitting: Internal Medicine

## 2020-07-02 DIAGNOSIS — I10 Essential (primary) hypertension: Secondary | ICD-10-CM

## 2020-07-02 DIAGNOSIS — M109 Gout, unspecified: Secondary | ICD-10-CM

## 2020-07-02 MED FILL — AMLODIPINE BESYLATE 10 MG T: 10 | 30 days supply | Qty: 30 | Fill #1

## 2020-07-02 MED FILL — LOSARTAN POTASSIUM 100 MG T: 100 | 30 days supply | Qty: 30 | Fill #1

## 2020-07-02 MED FILL — ?ALLOPURINOL 300 MG TABLET: 300 | 30 days supply | Qty: 30 | Fill #0

## 2020-07-02 MED FILL — TOPIRAMATE 25 MG TABS: 25 | 20 days supply | Qty: 41 | Fill #1

## 2020-07-02 NOTE — Telephone Encounter (Signed)
Medication: allopurinol (ZYLOPRIM) 300 MG tablet [[id number]] , amLODipine (NORVASC) 10 MG tablet [[id number]] , topiramate (TOPAMAX) 25 MG tablet [[id number]]  Has the patient contacted their pharmacy? YES (Agent: If no, request that the patient contact the pharmacy for the refill.) (Agent: If yes, when and what did the pharmacy advise?)  Preferred Pharmacy (with phone number or street name): [facility] - [place], [place] - [address]  [address], [place] [address]  Phone:  [phone] Fax:  [phone]   Agent: Please be advised that RX refills may take up to 3 business days. We ask that you follow-up with your pharmacy.

## 2020-07-04 ENCOUNTER — Other Ambulatory Visit: Payer: Self-pay

## 2020-07-06 ENCOUNTER — Other Ambulatory Visit: Payer: Self-pay

## 2020-07-06 ENCOUNTER — Encounter: Payer: Self-pay | Admitting: Cardiology

## 2020-07-06 ENCOUNTER — Ambulatory Visit (INDEPENDENT_AMBULATORY_CARE_PROVIDER_SITE_OTHER): Payer: Self-pay | Admitting: Cardiology

## 2020-07-06 ENCOUNTER — Other Ambulatory Visit: Payer: Self-pay | Admitting: Cardiology

## 2020-07-06 VITALS — BP 146/80 | HR 94 | Ht 70.0 in | Wt 331.1 lb

## 2020-07-06 DIAGNOSIS — I480 Paroxysmal atrial fibrillation: Secondary | ICD-10-CM

## 2020-07-06 DIAGNOSIS — I10 Essential (primary) hypertension: Secondary | ICD-10-CM

## 2020-07-06 MED ORDER — CARVEDILOL 25 MG PO TABS
25.0000 mg | ORAL_TABLET | Freq: Two times a day (BID) | ORAL | 3 refills | Status: DC
Start: 2020-07-06 — End: 2020-07-06

## 2020-07-06 MED ORDER — CARVEDILOL 6.25 MG PO TABS
6.2500 mg | ORAL_TABLET | Freq: Two times a day (BID) | ORAL | 3 refills | Status: DC
Start: 2020-07-06 — End: 2020-09-03

## 2020-07-06 MED FILL — HYDROCHLOROTHIAZIDE 12.5 MG: 12.5 | 30 days supply | Qty: 30 | Fill #2

## 2020-07-06 MED FILL — ?CARVEDILOL 25 MG TABLET: 25 | 30 days supply | Qty: 60 | Fill #0

## 2020-07-06 MED FILL — ?CARVEDILOL 6.25 MG TABLET: 6.25 | 30 days supply | Qty: 30 | Fill #0
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
## 2020-07-06 NOTE — Patient Instructions (Addendum)
Medication Instructions:  Your physician has recommended you make the following change in your medication:   Decrease your Carvedilol to 6.25 mg two times daily.  *If you need a refill on your cardiac medications before your next appointment, please call your pharmacy*   Lab Work: We will do your labs at your next appointment. You need to come fasting. If you have labs (blood work) drawn today and your tests are completely normal, you will receive your results only by: Marland Kitchen MyChart Message (if you have MyChart) OR . A paper copy in the mail If you have any lab test that is abnormal or we need to change your treatment, we will call you to review the results.   Testing/Procedures: None ordered   Follow-Up: At [facility], you and your health needs are our priority.  As part of our continuing mission to provide you with exceptional heart care, we have created designated Provider Care Teams.  These Care Teams include your primary Cardiologist (physician) and Advanced Practice Providers (APPs -  Physician Assistants and Nurse Practitioners) who all work together to provide you with the care you need, when you need it.  We recommend signing up for the patient portal called "MyChart".  Sign up information is provided on this After Visit Summary.  MyChart is used to connect with patients for Virtual Visits (Telemedicine).  Patients are able to view lab/test results, encounter notes, upcoming appointments, etc.  Non-urgent messages can be sent to your provider as well.   To learn more about what you can do with MyChart, go to [email or web address].    Your next appointment:   1 month(s)  The format for your next appointment:   In Person  Provider:   Belva Crome, MD   Other Instructions NA

## 2020-07-12 ENCOUNTER — Other Ambulatory Visit: Payer: Self-pay

## 2020-07-12 ENCOUNTER — Ambulatory Visit (HOSPITAL_BASED_OUTPATIENT_CLINIC_OR_DEPARTMENT_OTHER): Payer: Self-pay | Attending: Internal Medicine | Admitting: Internal Medicine

## 2020-07-12 VITALS — Ht 71.0 in | Wt 328.0 lb

## 2020-07-12 DIAGNOSIS — G4733 Obstructive sleep apnea (adult) (pediatric): Secondary | ICD-10-CM | POA: Insufficient documentation

## 2020-07-12 DIAGNOSIS — R0683 Snoring: Secondary | ICD-10-CM

## 2020-07-16 ENCOUNTER — Telehealth: Payer: Self-pay | Admitting: Internal Medicine

## 2020-07-21 ENCOUNTER — Other Ambulatory Visit: Payer: Self-pay | Admitting: Internal Medicine

## 2020-07-21 DIAGNOSIS — G4733 Obstructive sleep apnea (adult) (pediatric): Secondary | ICD-10-CM

## 2020-07-21 DIAGNOSIS — R0683 Snoring: Secondary | ICD-10-CM

## 2020-07-21 DIAGNOSIS — G4731 Primary central sleep apnea: Secondary | ICD-10-CM | POA: Insufficient documentation

## 2020-07-21 HISTORY — DX: Primary central sleep apnea: G47.31

## 2020-07-21 HISTORY — DX: Obstructive sleep apnea (adult) (pediatric): G47.33

## 2020-07-21 NOTE — Procedures (Signed)
    Patient Name: Wayne [name], Wayne [name] Date: 07/12/2020 Gender: Male D.O.B: March 03, 1976 Age (years): 29 Referring Provider: [name] MD Height (inches): 71 Interpreting Physician: Jetty Duhamel MD, ABSM Weight (lbs): 328 RPSGT: Cherylann Parr BMI: 46 MRN: [id number] Neck Size: 20.00  CLINICAL INFORMATION Sleep Study Type: NPSG Indication for sleep study: Obesity, Snoring, Witnesses Apnea / Gasping During Sleep Epworth Sleepiness Score: 10  SLEEP STUDY TECHNIQUE As per the AASM Manual for the Scoring of Sleep and Associated Events v2.3 (April 2016) with a hypopnea requiring 4% desaturations.  The channels recorded and monitored were frontal, central and occipital EEG, electrooculogram (EOG), submentalis EMG (chin), nasal and oral airflow, thoracic and abdominal wall motion, anterior tibialis EMG, snore microphone, electrocardiogram, and pulse oximetry.  MEDICATIONS Medications self-administered by patient taken the night of the study : none reported  SLEEP ARCHITECTURE The study was initiated at 10:24:36 PM and ended at 4:53:32 AM.  Sleep onset time was 20.4 minutes and the sleep efficiency was 87.2%. The total sleep time was 339 minutes.  Stage REM latency was 69.0 minutes.  The patient spent 2.80% of the night in stage N1 sleep, 76.84% in stage N2 sleep, 0.00% in stage N3 and 20.4% in REM.  Alpha intrusion was absent.  Supine sleep was 100.00%.  RESPIRATORY PARAMETERS The overall apnea/hypopnea index (AHI) was 66.7 per hour. There were 294 total apneas, including 169 obstructive, 114 central and 11 mixed apneas. There were 83 hypopneas and 2 RERAs.  The AHI during Stage REM sleep was 95.7 per hour.  AHI while supine was 66.7 per hour.  The mean oxygen saturation was 90.30%. The minimum SpO2 during sleep was 68.00%.  loud snoring was noted during this study.  CARDIAC DATA The 2 lead EKG demonstrated sinus rhythm. The mean heart rate was 72.15 beats per minute.  Other EKG findings include: None.  LEG MOVEMENT DATA The total PLMS were 7 with a resulting PLMS index of 1.24. Associated arousal with leg movement index was 0.0 .  IMPRESSIONS - Severe obstructive sleep apnea occurred during this study (AHI = 66.7/h). - Moderate central sleep apnea occurred during this study (CAI = 20.2/h). - Oxygen desaturation was noted during this study (Min O2 = 68.00%). Mean O2 sat 90.7%. Time with O2 sat 88% or less was 78.4 minutes. - The patient snored with loud snoring volume. - No cardiac abnormalities were noted during this study. - Clinically significant periodic limb movements did not occur during sleep. No significant associated arousals.  DIAGNOSIS - Obstructive Sleep Apnea (G47.33) - Central Sleep Apnea (G47.37) - Nocturnal Hypoxemia (G47.36)  RECOMMENDATIONS - CPAP titration to determine optimal pressure required to alleviate sleep disordered breathing and provide insurance documentation if supplemental O2 is also required.. - Be careful with alcohol, sedatives and other CNS depressants that may worsen sleep apnea and disrupt normal sleep architecture. - Sleep hygiene should be reviewed to assess factors that may improve sleep quality. - Weight management and regular exercise should be initiated or continued if appropriate.  [Electronically signed] 07/21/2020 12:33 PM  Jetty Duhamel MD, ABSM Diplomate, American Board of Sleep Medicine   NPI: [id number]                         Jetty Duhamel Diplomate, American Board of Sleep Medicine  ELECTRONICALLY SIGNED ON:  07/21/2020, 12:26 PM [facility] PH: [phone]   FX: [phone] ACCREDITED BY THE AMERICAN ACADEMY OF SLEEP MEDICINE

## 2020-07-23 ENCOUNTER — Other Ambulatory Visit: Payer: Self-pay | Admitting: Neurology

## 2020-07-23 MED FILL — COLCHICINE 0.6 MG TABS: 0.6 | 30 days supply | Qty: 30 | Fill #9

## 2020-07-23 MED FILL — ?CARVEDILOL 6.25 MG TABLET: 6.25 | 30 days supply | Qty: 60 | Fill #1

## 2020-07-26 ENCOUNTER — Ambulatory Visit: Payer: Self-pay | Attending: Internal Medicine | Admitting: Internal Medicine

## 2020-07-26 ENCOUNTER — Other Ambulatory Visit: Payer: Self-pay

## 2020-07-26 DIAGNOSIS — G4733 Obstructive sleep apnea (adult) (pediatric): Secondary | ICD-10-CM

## 2020-07-26 DIAGNOSIS — M25512 Pain in left shoulder: Secondary | ICD-10-CM

## 2020-07-26 DIAGNOSIS — G4731 Primary central sleep apnea: Secondary | ICD-10-CM

## 2020-07-26 DIAGNOSIS — I10 Essential (primary) hypertension: Secondary | ICD-10-CM

## 2020-07-26 DIAGNOSIS — F325 Major depressive disorder, single episode, in full remission: Secondary | ICD-10-CM

## 2020-07-26 NOTE — Progress Notes (Signed)
Virtual Visit via Telephone Note  I connected with Wayne [name] on 07/26/20 at 2:16 p.m by telephone and verified that I am speaking with the correct person using two identifiers.  Location: Patient: home Provider: office  The patient, my CMA Ms. Wayne [name] and myself participated in this encounter I discussed the limitations, risks, security and privacy concerns of performing an evaluation and management service by telephone and the availability of in person appointments. I also discussed with the patient that there may be a patient responsible charge related to this service. The patient expressed understanding and agreed to proceed.   History of Present Illness: Patient with history of gout (dx via jt fluid exam 10+ yrs ago per pt), rectal bleedingdue to internal hemorrhoids, HTN, hypertensive retinopathy, obesity, EtOH use disorder, B12 def, gout MDD.  Had sleep study earlier this mth.  It revealed severe sleep apea, moderate CSA and some hypoxia.  Await 2nd part of sleep study.  Had nl stress test 05/2020 that came back okay.  Also had an echo done in November that showed normal heart function.  He followed up with the cardiologist Dr. Tomie China last month.  He stressed lifestyle modification including limiting salt in the food and regular exercise.  He restarted him on carvedilol 6.25 mg for better control of his blood pressure.  Reports compliance with his other blood pressure medication including Cozaar, hydrochlorothiazide and amlodipine.  He has a blood pressure device but the cuff is not large enough to fit around his arm.  Depression:  Stopped Zoloft after 1 wk because it made him feel jittery Doing a lot better since vision is better.  He did see the neurologist Dr. Everlena Cooper for the headaches and vision changes.  Diagnosed with migraine with aura.  Started him on Topamax at bedtime.  Advised to keep headache diary.  Obesity: wgh fluctuates. Doing intermittent fasting QOD Just got gym  membership yesterday. Plans to start going.  He wonders about getting his handicap sticker extended an additional 2 months.  Complains of flare of arthritis pain in his left shoulder.  He would like referral to orthopedics to be given a steroid injection.  He had seen Dr. August Saucer for this about 2 years ago and got good relief with steroid injection.   Outpatient Encounter Medications as of 07/26/2020  Medication Sig  . allopurinol (ZYLOPRIM) 300 MG tablet Take 1 tablet (300 mg total) by mouth daily.  Marland Kitchen amLODipine (NORVASC) 10 MG tablet Take 1 tablet (10 mg total) by mouth daily.  Marland Kitchen aspirin EC 81 MG tablet Take 1 tablet (81 mg total) by mouth 2 (two) times daily.  . carvedilol (COREG) 6.25 MG tablet Take 1 tablet (6.25 mg total) by mouth 2 (two) times daily.  . Coenzyme Q10 (CO Q10 PO) Take 1 tablet by mouth daily.  . colchicine 0.6 MG tablet Take 0.6 mg by mouth daily.  . hydrochlorothiazide (HYDRODIURIL) 12.5 MG tablet Take 1 tablet (12.5 mg total) by mouth daily.  Marland Kitchen losartan (COZAAR) 100 MG tablet Take 1 tablet (100 mg total) by mouth daily.  . Multiple Vitamin (MULTIVITAMIN WITH MINERALS) TABS tablet Take 1 tablet by mouth daily with breakfast.   . Omega-3 Fatty Acids (FISH OIL PO) Take 1 tablet by mouth daily.  . polyethylene glycol powder (GLYCOLAX/MIRALAX) 17 GM/SCOOP powder MIX 17 GRAMS IN LIQUID & DRINK DAILY AS NEEDED FOR CONSTIPATION  . topiramate (TOPAMAX) 25 MG tablet Take 50 mg by mouth at bedtime.   No facility-administered encounter medications on  file as of 07/26/2020.      Observations/Objective: No direct observation done as this was a telephone encounter.  Assessment and Plan: 1. Essential hypertension Continue current medications including HCTZ, carvedilol, amlodipine and Cozaar.  Continue to limit salt in the foods.  Follow-up with clinical pharmacist in 2 weeks for BP recheck.  2. Morbid obesity (HCC) Advised against fasting every other day as a long-term solution for  weight loss.  Rather encourage smaller portion sizes and to get moving.  He plans to start going to the gym.  Advised that he start low and go slow.  3. OSA (obstructive sleep apnea) 4. Central sleep apnea Discussed diagnosis with patient.  Await appointment for titration part of the study.  I told him what to expect in regards to the titration study.  Advised against excessive alcohol use as it can make sleep apnea worse  5. Depression, major, in remission [facility]) Patient reports he is doing much better now that he knows that his vision is okay  6. Acute pain of left shoulder - Ambulatory referral to Orthopedic Surgery   Follow Up Instructions: 4 mths   I discussed the assessment and treatment plan with the patient. The patient was provided an opportunity to ask questions and all were answered. The patient agreed with the plan and demonstrated an understanding of the instructions.   The patient was advised to call back or seek an in-person evaluation if the symptoms worsen or if the condition fails to improve as anticipated.  I provided 20 minutes of non-face-to-face time during this encounter.   Wayne Blue, MD

## 2020-08-01 MED FILL — LOSARTAN POTASSIUM 100 MG T: 100 | 30 days supply | Qty: 30 | Fill #2

## 2020-08-01 MED FILL — ?ALLOPURINOL 300 MG TABLET: 300 | 30 days supply | Qty: 30 | Fill #1

## 2020-08-01 MED FILL — AMLODIPINE BESYLATE 10 MG T: 10 | 30 days supply | Qty: 30 | Fill #2

## 2020-08-02 MED FILL — HYDROCHLOROTHIAZIDE 12.5 MG: 12.5 | 30 days supply | Qty: 30 | Fill #3

## 2020-08-03 ENCOUNTER — Other Ambulatory Visit: Payer: Self-pay

## 2020-08-03 ENCOUNTER — Encounter: Payer: Self-pay | Admitting: Cardiology

## 2020-08-03 ENCOUNTER — Ambulatory Visit (INDEPENDENT_AMBULATORY_CARE_PROVIDER_SITE_OTHER): Payer: Self-pay | Admitting: Cardiology

## 2020-08-03 VITALS — BP 138/76 | HR 86 | Ht 71.0 in | Wt 330.1 lb

## 2020-08-03 DIAGNOSIS — G4733 Obstructive sleep apnea (adult) (pediatric): Secondary | ICD-10-CM

## 2020-08-03 DIAGNOSIS — I10 Essential (primary) hypertension: Secondary | ICD-10-CM

## 2020-08-03 NOTE — Patient Instructions (Signed)
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
## 2020-08-13 ENCOUNTER — Ambulatory Visit (INDEPENDENT_AMBULATORY_CARE_PROVIDER_SITE_OTHER): Payer: Self-pay | Admitting: Orthopedic Surgery

## 2020-08-13 ENCOUNTER — Encounter: Payer: Self-pay | Admitting: Orthopedic Surgery

## 2020-08-13 ENCOUNTER — Other Ambulatory Visit: Payer: Self-pay

## 2020-08-13 ENCOUNTER — Ambulatory Visit (INDEPENDENT_AMBULATORY_CARE_PROVIDER_SITE_OTHER): Payer: Self-pay

## 2020-08-13 DIAGNOSIS — G8929 Other chronic pain: Secondary | ICD-10-CM

## 2020-08-13 DIAGNOSIS — M25512 Pain in left shoulder: Secondary | ICD-10-CM

## 2020-08-13 DIAGNOSIS — M7552 Bursitis of left shoulder: Secondary | ICD-10-CM

## 2020-08-14 ENCOUNTER — Encounter: Payer: Self-pay | Admitting: Orthopedic Surgery

## 2020-08-14 DIAGNOSIS — M7552 Bursitis of left shoulder: Secondary | ICD-10-CM

## 2020-08-14 MED ORDER — LIDOCAINE HCL 1 % IJ SOLN
5.0000 mL | INTRAMUSCULAR | Status: AC | PRN
  Administered 2020-08-14: 5 mL

## 2020-08-14 MED ORDER — BUPIVACAINE HCL 0.5 % IJ SOLN
9.0000 mL | INTRAMUSCULAR | Status: AC | PRN
  Administered 2020-08-14: 9 mL via INTRA_ARTICULAR

## 2020-08-14 MED ORDER — METHYLPREDNISOLONE ACETATE 40 MG/ML IJ SUSP
40.0000 mg | INTRAMUSCULAR | Status: AC | PRN
  Administered 2020-08-14: 40 mg via INTRA_ARTICULAR
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
## 2020-08-21 ENCOUNTER — Other Ambulatory Visit: Payer: Self-pay | Admitting: Family

## 2020-08-27 ENCOUNTER — Other Ambulatory Visit: Payer: Self-pay | Admitting: Internal Medicine

## 2020-08-27 ENCOUNTER — Telehealth: Payer: Self-pay | Admitting: Internal Medicine

## 2020-08-27 MED ORDER — COLCHICINE 0.6 MG PO TABS: 0.6000 mg | ORAL_TABLET | Freq: Every day | ORAL | 2 refills | Status: DC

## 2020-08-27 NOTE — Telephone Encounter (Addendum)
Pt is calling and colchicine 0.6 mg was denied. Pt stated dr Laural Benes wants him to stay on the medication for his gout. Pt last seen dr Laural Benes 07-26-2020. [facility] phone number [phone]. Pt would like a callback

## 2020-08-27 NOTE — Telephone Encounter (Signed)
Rx sent 

## 2020-08-27 NOTE — Telephone Encounter (Signed)
Made pt aware

## 2020-08-28 MED FILL — COLCHICINE 0.6 MG TABS: 0.6 | 30 days supply | Qty: 30 | Fill #0

## 2020-09-03 ENCOUNTER — Other Ambulatory Visit: Payer: Self-pay | Admitting: Internal Medicine

## 2020-09-03 ENCOUNTER — Other Ambulatory Visit: Payer: Self-pay | Admitting: Cardiology

## 2020-09-03 DIAGNOSIS — I10 Essential (primary) hypertension: Secondary | ICD-10-CM

## 2020-09-03 MED FILL — AMLODIPINE BESYLATE 10 MG T: 10 | 30 days supply | Qty: 30 | Fill #0

## 2020-09-03 MED FILL — ?CARVEDILOL 6.25 MG TABLET: 6.25 | 30 days supply | Qty: 60 | Fill #0

## 2020-09-03 MED FILL — ?ALLOPURINOL 300 MG TABLET: 300 | 30 days supply | Qty: 30 | Fill #2

## 2020-09-03 NOTE — Telephone Encounter (Signed)
Refill sent to pharmacy.   

## 2020-09-04 MED FILL — HYDROCHLOROTHIAZIDE 12.5 MG: 12.5 | 30 days supply | Qty: 30 | Fill #0

## 2020-09-07 ENCOUNTER — Encounter: Payer: Self-pay | Admitting: Internal Medicine

## 2020-09-07 ENCOUNTER — Other Ambulatory Visit: Payer: Self-pay | Admitting: Cardiology

## 2020-09-07 ENCOUNTER — Other Ambulatory Visit: Payer: Self-pay

## 2020-09-07 ENCOUNTER — Telehealth: Payer: Self-pay | Admitting: Cardiology

## 2020-09-07 DIAGNOSIS — I10 Essential (primary) hypertension: Secondary | ICD-10-CM

## 2020-09-07 MED ORDER — LOSARTAN POTASSIUM 100 MG PO TABS: 100.0000 mg | ORAL_TABLET | Freq: Every day | ORAL | 3 refills | Status: DC

## 2020-09-07 MED FILL — LOSARTAN POTASSIUM 100 MG T: 100 | 30 days supply | Qty: 30 | Fill #0

## 2020-09-07 NOTE — Telephone Encounter (Signed)
*  STAT* If patient is at the pharmacy, call can be transferred to refill team.   1. Which medications need to be refilled? (please list name of each medication and dose if known) losartan (COZAAR) 100 MG tablet  2. Which pharmacy/location (including street and city if local pharmacy) is medication to be sent to? [facility] - [place], [place] - [place] [address]  3. Do they need a 30 day or 90 day supply? 90 day   Vonna Kotyk from [facility] states they need the prescription resent. The one they received states print. She states they need an electronic one sent not print, before 4:00 pm today.

## 2020-09-07 NOTE — Telephone Encounter (Signed)
RX sent to pharmacy  

## 2020-09-08 ENCOUNTER — Other Ambulatory Visit: Payer: Self-pay

## 2020-09-10 ENCOUNTER — Other Ambulatory Visit: Payer: Self-pay

## 2020-09-10 ENCOUNTER — Other Ambulatory Visit: Payer: Self-pay | Admitting: Physician Assistant

## 2020-09-10 ENCOUNTER — Ambulatory Visit (INDEPENDENT_AMBULATORY_CARE_PROVIDER_SITE_OTHER): Payer: Self-pay | Admitting: Orthopedic Surgery

## 2020-09-10 DIAGNOSIS — M25512 Pain in left shoulder: Secondary | ICD-10-CM

## 2020-09-10 DIAGNOSIS — G8929 Other chronic pain: Secondary | ICD-10-CM

## 2020-09-14 ENCOUNTER — Encounter: Payer: Self-pay | Admitting: Orthopedic Surgery
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
## 2020-09-24 ENCOUNTER — Other Ambulatory Visit: Payer: Self-pay

## 2020-09-24 MED FILL — Colchicine Tab 0.6 MG: ORAL | 30 days supply | Qty: 30 | Fill #0 | Status: AC

## 2020-10-03 ENCOUNTER — Other Ambulatory Visit: Payer: Self-pay

## 2020-10-03 MED FILL — Carvedilol Tab 6.25 MG: ORAL | 30 days supply | Qty: 60 | Fill #0 | Status: AC

## 2020-10-03 MED FILL — Amlodipine Besylate Tab 10 MG (Base Equivalent): ORAL | 30 days supply | Qty: 30 | Fill #0 | Status: AC

## 2020-10-04 ENCOUNTER — Other Ambulatory Visit: Payer: Self-pay

## 2020-10-04 ENCOUNTER — Ambulatory Visit
Admission: RE | Admit: 2020-10-04 | Discharge: 2020-10-04 | Disposition: A | Discharge status: Home / Self Care | Payer: Self-pay | Source: Ambulatory Visit | Attending: Orthopedic Surgery | Admitting: Orthopedic Surgery

## 2020-10-04 DIAGNOSIS — G8929 Other chronic pain: Secondary | ICD-10-CM

## 2020-10-04 DIAGNOSIS — M25512 Pain in left shoulder: Secondary | ICD-10-CM

## 2020-10-04 MED ORDER — IOPAMIDOL (ISOVUE-M 200) INJECTION 41%
15.0000 mL | Freq: Once | INTRAMUSCULAR | Status: AC
  Administered 2020-10-04: 15 mL via INTRA_ARTICULAR

## 2020-10-05 ENCOUNTER — Other Ambulatory Visit: Payer: Self-pay | Admitting: Internal Medicine

## 2020-10-05 ENCOUNTER — Other Ambulatory Visit: Payer: Self-pay

## 2020-10-05 DIAGNOSIS — M109 Gout, unspecified: Secondary | ICD-10-CM

## 2020-10-05 MED ORDER — ALLOPURINOL 300 MG PO TABS
ORAL_TABLET | Freq: Every day | ORAL | 2 refills | Status: DC
  Filled 2020-10-05: qty 30, 30d supply, fill #0
  Filled 2020-11-02: qty 30, 30d supply, fill #1
  Filled 2020-12-03: qty 30, 30d supply, fill #2

## 2020-10-05 MED FILL — Hydrochlorothiazide Cap 12.5 MG: ORAL | 30 days supply | Qty: 30 | Fill #0 | Status: AC

## 2020-10-05 MED FILL — Losartan Potassium Tab 100 MG: ORAL | 30 days supply | Qty: 30 | Fill #0 | Status: AC

## 2020-10-08 ENCOUNTER — Ambulatory Visit (INDEPENDENT_AMBULATORY_CARE_PROVIDER_SITE_OTHER): Payer: Self-pay | Admitting: Orthopedic Surgery

## 2020-10-08 ENCOUNTER — Ambulatory Visit: Payer: Self-pay

## 2020-10-08 ENCOUNTER — Telehealth: Payer: Self-pay | Admitting: Internal Medicine

## 2020-10-08 ENCOUNTER — Other Ambulatory Visit: Payer: Self-pay

## 2020-10-08 DIAGNOSIS — M75110 Incomplete rotator cuff tear or rupture of unspecified shoulder, not specified as traumatic: Secondary | ICD-10-CM

## 2020-10-08 DIAGNOSIS — M75102 Unspecified rotator cuff tear or rupture of left shoulder, not specified as traumatic: Secondary | ICD-10-CM

## 2020-10-08 DIAGNOSIS — M7502 Adhesive capsulitis of left shoulder: Secondary | ICD-10-CM

## 2020-10-08 DIAGNOSIS — M25512 Pain in left shoulder: Secondary | ICD-10-CM

## 2020-10-08 DIAGNOSIS — G8929 Other chronic pain: Secondary | ICD-10-CM

## 2020-10-08 NOTE — Telephone Encounter (Signed)
Patient came into the clinic requesting for his financial letter to be faxed to [facility]. Please follow up on request.

## 2020-10-08 NOTE — Progress Notes (Signed)
   Wayne [name] - 45 y.o. male MRN [id number]  Date of birth: 30-Jan-1976  Office Visit Note: Visit Date: 10/08/2020 PCP: Marcine Matar, MD Referred by: Marcine Matar, MD  Subjective: Chief Complaint  Patient presents with  . Other     Scan review   HPI:  Wayne [name] is a 45 y.o. male who comes in today at the request of Dr. Burnard Bunting for planned Left anesthetic glenohumeral arthrogram with fluoroscopic guidance.  The patient has failed conservative care including home exercise, medications, time and activity modification.  This injection will be diagnostic and hopefully therapeutic.  Please see requesting physician notes for further details and justification.   ROS Otherwise per HPI.  Assessment & Plan: Visit Diagnoses:    ICD-10-CM   1. Chronic left shoulder pain  M25.512 XR C-ARM NO REPORT   G89.29     Plan: No additional findings.   Meds & Orders: No orders of the defined types were placed in this encounter.   Orders Placed This Encounter  Procedures  . Large Joint Inj  . XR C-ARM NO REPORT    Follow-up: No follow-ups on file.   Procedures: Large Joint Inj: L glenohumeral (Left) on 10/08/2020 3:49 PM Indications: pain and diagnostic evaluation Details: 22 G 3.5 in needle, fluoroscopy-guided anteromedial approach  Arthrogram: No  Medications: 3 mL bupivacaine 0.5 %; 60 mg triamcinolone acetonide 40 MG/ML Outcome: tolerated well, no immediate complications  There was excellent flow of contrast producing a partial arthrogram of the glenohumeral joint. The patient did have some relief of symptoms during the anesthetic phase of the injection with increased ROM Procedure, treatment alternatives, risks and benefits explained, specific risks discussed. Consent was given by the patient. Immediately prior to procedure a time out was called to verify the correct patient, procedure, equipment, support staff and site/side marked as required. Patient was prepped and  draped in the usual sterile fashion.          Clinical History: No specialty comments available.     Objective:  VS:  HT:    WT:   BMI:     BP:   HR: bpm  TEMP: ( )  RESP:  Physical Exam   Imaging: No results found.

## 2020-10-11 NOTE — Telephone Encounter (Signed)
I return Pt call, LVM inform him that his CAFA was Denied until 10/28/20 since he was missing documentation with his application, for this reason at this time he has no coverage and he can not reapply before 10/28/20

## 2020-10-12 ENCOUNTER — Encounter: Payer: Self-pay | Admitting: Orthopedic Surgery
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
## 2020-10-13 MED ORDER — TRIAMCINOLONE ACETONIDE 40 MG/ML IJ SUSP
60.0000 mg | INTRAMUSCULAR | Status: AC | PRN
  Administered 2020-10-08: 60 mg via INTRA_ARTICULAR

## 2020-10-13 MED ORDER — BUPIVACAINE HCL 0.5 % IJ SOLN
3.0000 mL | INTRAMUSCULAR | Status: AC | PRN
  Administered 2020-10-08: 3 mL via INTRA_ARTICULAR

## 2020-10-24 ENCOUNTER — Other Ambulatory Visit: Payer: Self-pay | Admitting: Cardiology

## 2020-10-24 MED FILL — Colchicine Tab 0.6 MG: ORAL | 30 days supply | Qty: 30 | Fill #1 | Status: AC

## 2020-10-25 ENCOUNTER — Other Ambulatory Visit: Payer: Self-pay

## 2020-10-25 MED ORDER — CARVEDILOL 6.25 MG PO TABS
6.2500 mg | ORAL_TABLET | Freq: Two times a day (BID) | ORAL | 2 refills | Status: DC
  Filled 2020-10-25: qty 60, 30d supply, fill #0
  Filled 2020-12-17: qty 60, 30d supply, fill #1
  Filled 2021-01-21: qty 60, 30d supply, fill #2
  Filled 2021-02-21: qty 60, 30d supply, fill #3
  Filled 2021-03-25: qty 60, 30d supply, fill #4

## 2020-10-26 ENCOUNTER — Other Ambulatory Visit: Payer: Self-pay

## 2020-11-02 ENCOUNTER — Other Ambulatory Visit: Payer: Self-pay

## 2020-11-02 MED FILL — Amlodipine Besylate Tab 10 MG (Base Equivalent): ORAL | 30 days supply | Qty: 30 | Fill #1 | Status: AC

## 2020-11-07 ENCOUNTER — Other Ambulatory Visit: Payer: Self-pay

## 2020-11-07 MED FILL — Hydrochlorothiazide Cap 12.5 MG: ORAL | 30 days supply | Qty: 30 | Fill #1 | Status: AC

## 2020-11-07 MED FILL — Losartan Potassium Tab 100 MG: ORAL | 30 days supply | Qty: 30 | Fill #1 | Status: AC

## 2020-11-19 ENCOUNTER — Ambulatory Visit (INDEPENDENT_AMBULATORY_CARE_PROVIDER_SITE_OTHER): Payer: Self-pay | Admitting: Orthopedic Surgery

## 2020-11-19 DIAGNOSIS — M7502 Adhesive capsulitis of left shoulder: Secondary | ICD-10-CM

## 2020-11-19 DIAGNOSIS — M25512 Pain in left shoulder: Secondary | ICD-10-CM

## 2020-11-19 DIAGNOSIS — G8929 Other chronic pain: Secondary | ICD-10-CM

## 2020-11-20 ENCOUNTER — Encounter: Payer: Self-pay | Admitting: Orthopedic Surgery
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
## 2020-11-21 ENCOUNTER — Other Ambulatory Visit: Payer: Self-pay | Admitting: Internal Medicine

## 2020-11-21 ENCOUNTER — Other Ambulatory Visit: Payer: Self-pay

## 2020-11-21 MED ORDER — COLCHICINE 0.6 MG PO TABS
ORAL_TABLET | ORAL | 2 refills | Status: DC
  Filled 2020-11-21: qty 30, 30d supply, fill #0
  Filled 2020-12-17: qty 30, 30d supply, fill #1
  Filled 2021-01-21: qty 30, 30d supply, fill #2

## 2020-11-26 ENCOUNTER — Other Ambulatory Visit: Payer: Self-pay
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
## 2020-12-03 ENCOUNTER — Other Ambulatory Visit: Payer: Self-pay

## 2020-12-03 ENCOUNTER — Other Ambulatory Visit: Payer: Self-pay | Admitting: Internal Medicine

## 2020-12-03 ENCOUNTER — Ambulatory Visit: Payer: Self-pay | Admitting: Neurology

## 2020-12-03 DIAGNOSIS — I10 Essential (primary) hypertension: Secondary | ICD-10-CM

## 2020-12-03 MED ORDER — AMLODIPINE BESYLATE 10 MG PO TABS
10.0000 mg | ORAL_TABLET | Freq: Every day | ORAL | 0 refills | Status: DC
  Filled 2020-12-03: qty 30, 30d supply, fill #0
  Filled 2021-01-02: qty 30, 30d supply, fill #1
  Filled 2021-02-01: qty 30, 30d supply, fill #2

## 2020-12-04 ENCOUNTER — Other Ambulatory Visit: Payer: Self-pay

## 2020-12-04 ENCOUNTER — Ambulatory Visit: Payer: Self-pay | Attending: Surgical | Admitting: Physical Therapy

## 2020-12-04 ENCOUNTER — Encounter: Payer: Self-pay | Admitting: Physical Therapy

## 2020-12-04 DIAGNOSIS — R293 Abnormal posture: Secondary | ICD-10-CM

## 2020-12-04 DIAGNOSIS — M6281 Muscle weakness (generalized): Secondary | ICD-10-CM

## 2020-12-04 DIAGNOSIS — M25512 Pain in left shoulder: Secondary | ICD-10-CM | POA: Insufficient documentation

## 2020-12-04 DIAGNOSIS — M25612 Stiffness of left shoulder, not elsewhere classified: Secondary | ICD-10-CM

## 2020-12-04 DIAGNOSIS — G8929 Other chronic pain: Secondary | ICD-10-CM
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
## 2020-12-04 NOTE — Patient Instructions (Signed)
      Access Code: [facility] URL: [email or web address] Date: 12/04/2020 Prepared by: Glenetta Hew  Exercises Doorway Pec Stretch at 60 Degrees Abduction with Arm Straight - 2-3 x daily - 7 x weekly - 3 reps - 30 sec hold Shoulder Flexion Overhead with Dowel - 1 x daily - 7 x weekly - 2 sets - 10 reps - 3 sec hold Standing Shoulder Abduction ROM with Dowel - 1 x daily - 7 x weekly - 2 sets - 10 reps - 3 sec hold Standing Shoulder Row with Anchored Resistance - 1 x daily - 7 x weekly - 2 sets - 10 reps - 5 sec hold Scapular Retraction with Resistance Advanced - 1 x daily - 7 x weekly - 2 sets - 10 reps - 5 sec hold Shoulder External Rotation and Scapular Retraction with Resistance - 1 x daily - 7 x weekly - 2 sets - 10 reps - 3-5 sec hold Standing Shoulder Flexion with Resistance - 1 x daily - 7 x weekly - 2 sets - 10 reps - 2-3 sec hold Scaption with Resistance - 1 x daily - 7 x weekly - 2 sets - 10 reps - 2-3 sec hold

## 2020-12-05 ENCOUNTER — Encounter: Payer: Self-pay | Admitting: Neurology

## 2020-12-05 ENCOUNTER — Other Ambulatory Visit: Payer: Self-pay

## 2020-12-05 ENCOUNTER — Telehealth (INDEPENDENT_AMBULATORY_CARE_PROVIDER_SITE_OTHER): Payer: Self-pay | Admitting: Neurology

## 2020-12-05 VITALS — Ht 71.0 in | Wt 320.0 lb

## 2020-12-05 DIAGNOSIS — R519 Headache, unspecified: Secondary | ICD-10-CM

## 2020-12-05 DIAGNOSIS — G43519 Persistent migraine aura without cerebral infarction, intractable, without status migrainosus: Secondary | ICD-10-CM

## 2020-12-06 ENCOUNTER — Other Ambulatory Visit: Payer: Self-pay

## 2020-12-06 MED FILL — Hydrochlorothiazide Cap 12.5 MG: ORAL | 30 days supply | Qty: 30 | Fill #2 | Status: AC

## 2020-12-06 MED FILL — Losartan Potassium Tab 100 MG: ORAL | 30 days supply | Qty: 30 | Fill #2 | Status: AC

## 2020-12-17 ENCOUNTER — Other Ambulatory Visit: Payer: Self-pay

## 2020-12-18 ENCOUNTER — Other Ambulatory Visit: Payer: Self-pay

## 2021-01-02 ENCOUNTER — Other Ambulatory Visit: Payer: Self-pay | Admitting: Internal Medicine

## 2021-01-02 ENCOUNTER — Other Ambulatory Visit: Payer: Self-pay

## 2021-01-02 DIAGNOSIS — M109 Gout, unspecified: Secondary | ICD-10-CM

## 2021-01-02 NOTE — Telephone Encounter (Signed)
Requested medications are due for refill today.  yes  Requested medications are on the active medications list.  yes  Last refill. 10/05/2020  Future visit scheduled.   no  Notes to clinic.  Pt is overdue for lab work.

## 2021-01-03 ENCOUNTER — Other Ambulatory Visit: Payer: Self-pay

## 2021-01-03 MED ORDER — ALLOPURINOL 300 MG PO TABS
300.0000 mg | ORAL_TABLET | Freq: Every day | ORAL | 0 refills | Status: DC
  Filled 2021-01-03: qty 30, 30d supply, fill #0

## 2021-01-04 ENCOUNTER — Other Ambulatory Visit: Payer: Self-pay

## 2021-01-08 ENCOUNTER — Other Ambulatory Visit: Payer: Self-pay

## 2021-01-08 ENCOUNTER — Other Ambulatory Visit: Payer: Self-pay | Admitting: Internal Medicine

## 2021-01-08 MED ORDER — HYDROCHLOROTHIAZIDE 12.5 MG PO CAPS
ORAL_CAPSULE | Freq: Every day | ORAL | 3 refills | Status: DC
  Filled 2021-01-08: qty 30, 30d supply, fill #0
  Filled 2021-02-05: qty 30, 30d supply, fill #1
  Filled 2021-03-11: qty 30, 30d supply, fill #2
  Filled 2021-04-08: qty 30, 30d supply, fill #3

## 2021-01-08 MED FILL — Losartan Potassium Tab 100 MG: ORAL | 30 days supply | Qty: 30 | Fill #3 | Status: AC

## 2021-01-21 ENCOUNTER — Other Ambulatory Visit: Payer: Self-pay

## 2021-01-22 ENCOUNTER — Other Ambulatory Visit: Payer: Self-pay

## 2021-01-29 ENCOUNTER — Ambulatory Visit: Payer: Self-pay | Admitting: Cardiology

## 2021-02-01 ENCOUNTER — Other Ambulatory Visit: Payer: Self-pay | Admitting: Internal Medicine

## 2021-02-01 ENCOUNTER — Other Ambulatory Visit: Payer: Self-pay

## 2021-02-01 ENCOUNTER — Ambulatory Visit: Payer: Self-pay | Admitting: Cardiology

## 2021-02-01 DIAGNOSIS — M109 Gout, unspecified: Secondary | ICD-10-CM

## 2021-02-01 NOTE — Telephone Encounter (Signed)
Requested medication (s) are due for refill today: Yes  Requested medication (s) are on the active medication list: Yes  Last refill:  01/03/21  Future visit scheduled: No  Notes to clinic:  Unable to refill per protocol, appointment needed, failed uric acid labs, last collected in 2019.     Requested Prescriptions  Pending Prescriptions Disp Refills   allopurinol (ZYLOPRIM) 300 MG tablet 30 tablet 0    Sig: Take 1 tablet (300 mg total) by mouth daily.     Endocrinology:  Gout Agents Failed - 02/01/2021  9:56 AM      Failed - Uric Acid in normal range and within 360 days    Uric Acid  Date Value Ref Range Status  01/12/2018 5.1 3.7 - 8.6 mg/dL Final    Comment:               Therapeutic target for gout patients: <6.0          Passed - Cr in normal range and within 360 days    Creatinine, Ser  Date Value Ref Range Status  05/16/2020 0.80 0.61 - 1.24 mg/dL Final          Passed - Valid encounter within last 12 months    Recent Outpatient Visits           6 months ago Essential hypertension   [facility] Marcine Matar, MD   8 months ago Daily headache   [facility] Marcine Matar, MD   8 months ago Hospital discharge follow-up   [facility] Marcine Matar, MD   8 months ago [place] vision, bilateral   [facility] May[place], Cari S, [place]   10 months ago Need for influenza vaccination   [facility] Lois Huxley, Cornelius Moras, RPH-CPP       Future Appointments             In 2 months Revankar, Aundra Dubin, MD [facility]

## 2021-02-01 NOTE — Telephone Encounter (Signed)
Patient called, left VM to return the call to the office to schedule an OV for follow up. Noted on last refill to schedule visit for additional refills.

## 2021-02-05 ENCOUNTER — Other Ambulatory Visit: Payer: Self-pay

## 2021-02-05 MED FILL — Losartan Potassium Tab 100 MG: ORAL | 30 days supply | Qty: 30 | Fill #4 | Status: AC

## 2021-02-06 ENCOUNTER — Other Ambulatory Visit: Payer: Self-pay

## 2021-02-07 ENCOUNTER — Other Ambulatory Visit: Payer: Self-pay

## 2021-02-21 ENCOUNTER — Other Ambulatory Visit: Payer: Self-pay

## 2021-02-21 ENCOUNTER — Other Ambulatory Visit: Payer: Self-pay | Admitting: Internal Medicine

## 2021-02-21 MED ORDER — COLCHICINE 0.6 MG PO TABS
ORAL_TABLET | ORAL | 2 refills | Status: DC
  Filled 2021-02-21: qty 30, 30d supply, fill #0
  Filled 2021-03-20: qty 30, 30d supply, fill #1

## 2021-02-21 NOTE — Telephone Encounter (Signed)
Requested medication (s) are due for refill today: yes  Requested medication (s) are on the active medication list: yes  Last refill:  11/21/20-11/21/21 #30 2 refills  Future visit scheduled: yes in 1 month  Notes to clinic:  [facility]-OP[facility] last uric acid level- 01/12/2018     Requested Prescriptions  Pending Prescriptions Disp Refills   colchicine 0.6 MG tablet 30 tablet 2    Sig: TAKE 1 TABLET (0.6 MG TOTAL) BY MOUTH DAILY.     Endocrinology:  Gout Agents Failed - 02/21/2021  3:01 PM      Failed - Uric Acid in normal range and within 360 days    Uric Acid  Date Value Ref Range Status  01/12/2018 5.1 3.7 - 8.6 mg/dL Final    Comment:               Therapeutic target for gout patients: <6.0          Passed - Cr in normal range and within 360 days    Creatinine, Ser  Date Value Ref Range Status  05/16/2020 0.80 0.61 - 1.24 mg/dL Final          Passed - Valid encounter within last 12 months    Recent Outpatient Visits           7 months ago Essential hypertension   [facility] Marcine Matar, MD   9 months ago Daily headache   [facility] Marcine Matar, MD   9 months ago Hospital discharge follow-up   [facility] Marcine Matar, MD   9 months ago [employer] vision, bilateral   [facility] [place], [place], [place]   11 months ago Need for influenza vaccination   [facility] Drucilla Chalet, RPH-CPP       Future Appointments             In 1 month Laural Benes, Binnie Rail, MD [facility]   In 2 months Revankar, Aundra Dubin, MD [facility]

## 2021-02-22 ENCOUNTER — Other Ambulatory Visit: Payer: Self-pay | Admitting: Pharmacist

## 2021-02-22 ENCOUNTER — Other Ambulatory Visit: Payer: Self-pay

## 2021-02-22 DIAGNOSIS — M109 Gout, unspecified: Secondary | ICD-10-CM

## 2021-02-22 MED ORDER — ALLOPURINOL 300 MG PO TABS
300.0000 mg | ORAL_TABLET | Freq: Every day | ORAL | 1 refills | Status: DC
  Filled 2021-02-22: qty 30, 30d supply, fill #0
  Filled 2021-03-20: qty 30, 30d supply, fill #1

## 2021-03-07 ENCOUNTER — Other Ambulatory Visit: Payer: Self-pay

## 2021-03-07 ENCOUNTER — Other Ambulatory Visit: Payer: Self-pay | Admitting: Internal Medicine

## 2021-03-07 DIAGNOSIS — I10 Essential (primary) hypertension: Secondary | ICD-10-CM

## 2021-03-07 MED ORDER — AMLODIPINE BESYLATE 10 MG PO TABS
10.0000 mg | ORAL_TABLET | Freq: Every day | ORAL | 0 refills | Status: DC
  Filled 2021-03-07: qty 30, 30d supply, fill #0

## 2021-03-08 ENCOUNTER — Other Ambulatory Visit: Payer: Self-pay

## 2021-03-08 MED FILL — Losartan Potassium Tab 100 MG: ORAL | 30 days supply | Qty: 30 | Fill #5 | Status: AC

## 2021-03-11 ENCOUNTER — Other Ambulatory Visit: Payer: Self-pay

## 2021-03-12 ENCOUNTER — Other Ambulatory Visit: Payer: Self-pay

## 2021-03-20 ENCOUNTER — Other Ambulatory Visit: Payer: Self-pay

## 2021-03-25 ENCOUNTER — Other Ambulatory Visit: Payer: Self-pay

## 2021-03-26 ENCOUNTER — Other Ambulatory Visit: Payer: Self-pay

## 2021-04-05 ENCOUNTER — Other Ambulatory Visit: Payer: Self-pay | Admitting: Pharmacist

## 2021-04-05 ENCOUNTER — Other Ambulatory Visit: Payer: Self-pay

## 2021-04-05 ENCOUNTER — Other Ambulatory Visit: Payer: Self-pay | Admitting: Internal Medicine

## 2021-04-05 DIAGNOSIS — I10 Essential (primary) hypertension: Secondary | ICD-10-CM

## 2021-04-05 DIAGNOSIS — M109 Gout, unspecified: Secondary | ICD-10-CM

## 2021-04-05 MED ORDER — AMLODIPINE BESYLATE 10 MG PO TABS
10.0000 mg | ORAL_TABLET | Freq: Every day | ORAL | 0 refills | Status: DC
  Filled 2021-04-05: qty 30, 30d supply, fill #0

## 2021-04-08 ENCOUNTER — Other Ambulatory Visit: Payer: Self-pay

## 2021-04-08 ENCOUNTER — Encounter: Payer: Self-pay | Admitting: Internal Medicine

## 2021-04-08 ENCOUNTER — Ambulatory Visit: Payer: Self-pay | Attending: Internal Medicine | Admitting: Internal Medicine

## 2021-04-08 VITALS — BP 122/85 | HR 74 | Resp 16 | Wt 330.4 lb

## 2021-04-08 DIAGNOSIS — Z23 Encounter for immunization: Secondary | ICD-10-CM

## 2021-04-08 DIAGNOSIS — I10 Essential (primary) hypertension: Secondary | ICD-10-CM

## 2021-04-08 DIAGNOSIS — Z8739 Personal history of other diseases of the musculoskeletal system and connective tissue: Secondary | ICD-10-CM

## 2021-04-08 DIAGNOSIS — G4731 Primary central sleep apnea: Secondary | ICD-10-CM

## 2021-04-08 DIAGNOSIS — G4733 Obstructive sleep apnea (adult) (pediatric): Secondary | ICD-10-CM

## 2021-04-08 MED ORDER — ALLOPURINOL 300 MG PO TABS
300.0000 mg | ORAL_TABLET | Freq: Every day | ORAL | 2 refills | Status: DC
Start: 2021-04-08 — End: 2021-07-10
  Filled 2021-04-08 – 2021-04-22 (×2): qty 30, 30d supply, fill #0
  Filled 2021-05-20: qty 30, 30d supply, fill #1
  Filled 2021-06-18: qty 30, 30d supply, fill #2
  Filled 2021-06-19: qty 30, 30d supply, fill #0

## 2021-04-08 MED ORDER — LOSARTAN POTASSIUM 100 MG PO TABS
ORAL_TABLET | Freq: Every day | ORAL | 2 refills | Status: DC
  Filled 2021-05-13: qty 30, 30d supply, fill #0
  Filled 2021-06-05: qty 30, 30d supply, fill #1
  Filled 2021-07-08: qty 30, 30d supply, fill #2
  Filled 2021-07-08: qty 30, 30d supply, fill #0
  Filled 2021-08-11: qty 30, 30d supply, fill #1
  Filled 2021-09-09: qty 30, 30d supply, fill #2
  Filled 2021-10-14: qty 30, 30d supply, fill #3
  Filled 2021-11-11: qty 30, 30d supply, fill #4
  Filled 2021-11-18: qty 30, 30d supply, fill #5

## 2021-04-08 MED ORDER — HYDROCHLOROTHIAZIDE 12.5 MG PO TABS
12.5000 mg | ORAL_TABLET | Freq: Every day | ORAL | 3 refills | Status: DC
Start: 2021-04-08 — End: 2021-04-08
  Filled 2021-04-08: qty 30, 30d supply, fill #0

## 2021-04-08 MED ORDER — CARVEDILOL 6.25 MG PO TABS
6.2500 mg | ORAL_TABLET | Freq: Two times a day (BID) | ORAL | 2 refills | Status: DC
Start: 2021-04-08 — End: 2021-12-12
  Filled 2021-04-08: qty 180, 90d supply, fill #0
  Filled 2021-05-06: qty 60, 30d supply, fill #0
  Filled 2021-06-05: qty 60, 30d supply, fill #1
  Filled 2021-07-08: qty 60, 30d supply, fill #2
  Filled 2021-07-08: qty 60, 30d supply, fill #0
  Filled 2021-08-11: qty 60, 30d supply, fill #1
  Filled 2021-09-09: qty 60, 30d supply, fill #2
  Filled 2021-10-15: qty 60, 30d supply, fill #3
  Filled 2021-11-11: qty 60, 30d supply, fill #4
  Filled 2021-11-18: qty 60, 30d supply, fill #5

## 2021-04-08 MED ORDER — COLCHICINE 0.6 MG PO TABS
ORAL_TABLET | ORAL | 2 refills | Status: DC
  Filled 2021-04-08: qty 30, fill #0
  Filled 2021-04-22: qty 30, 30d supply, fill #0
  Filled 2021-05-20: qty 30, 30d supply, fill #1
  Filled 2021-06-21: qty 30, 30d supply, fill #0

## 2021-04-08 MED ORDER — HYDROCHLOROTHIAZIDE 12.5 MG PO TABS
12.5000 mg | ORAL_TABLET | Freq: Every day | ORAL | 1 refills | Status: DC
  Filled 2021-04-08: qty 30, 30d supply, fill #0
  Filled 2021-06-05: qty 30, 30d supply, fill #1
  Filled 2021-07-08: qty 30, 30d supply, fill #0
  Filled 2021-07-08: qty 30, 30d supply, fill #2
  Filled 2021-08-11 – 2021-08-12 (×2): qty 30, 30d supply, fill #1
  Filled 2021-09-09: qty 30, 30d supply, fill #2
  Filled 2021-10-03: qty 30, 30d supply, fill #3

## 2021-04-08 MED ORDER — AMLODIPINE BESYLATE 10 MG PO TABS
10.0000 mg | ORAL_TABLET | Freq: Every day | ORAL | 0 refills | Status: DC
Start: 2021-04-08 — End: 2021-04-08
  Filled 2021-04-08: qty 30, 30d supply, fill #0

## 2021-04-08 MED ORDER — AMLODIPINE BESYLATE 10 MG PO TABS
10.0000 mg | ORAL_TABLET | Freq: Every day | ORAL | 1 refills | Status: DC
  Filled 2021-04-08 – 2021-05-06 (×2): qty 90, 90d supply, fill #0
  Filled 2021-06-18 – 2021-07-08 (×2): qty 90, 90d supply, fill #1
  Filled 2021-07-08: qty 90, 90d supply, fill #0
  Filled 2021-08-05: qty 30, 30d supply, fill #0
  Filled 2021-09-03: qty 30, 30d supply, fill #1
  Filled 2021-10-03 (×2): qty 30, 30d supply, fill #2

## 2021-04-08 MED FILL — Losartan Potassium Tab 100 MG: ORAL | 30 days supply | Qty: 30 | Fill #6 | Status: AC

## 2021-04-08 NOTE — Patient Instructions (Signed)
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
## 2021-04-09 ENCOUNTER — Other Ambulatory Visit: Payer: Self-pay

## 2021-04-09 LAB — COMPREHENSIVE METABOLIC PANEL
ALT: 34 IU/L (ref 0–44)
AST: 27 IU/L (ref 0–40)
Albumin/Globulin Ratio: 1.7 (ref 1.2–2.2)
Albumin: 4.4 g/dL (ref 4.0–5.0)
Alkaline Phosphatase: 55 IU/L (ref 44–121)
BUN/Creatinine Ratio: 12 (ref 9–20)
BUN: 9 mg/dL (ref 6–24)
Bilirubin Total: 0.8 mg/dL (ref 0.0–1.2)
CO2: 25 mmol/L (ref 20–29)
Calcium: 9.4 mg/dL (ref 8.7–10.2)
Chloride: 105 mmol/L (ref 96–106)
Creatinine, Ser: 0.77 mg/dL (ref 0.76–1.27)
Globulin, Total: 2.6 g/dL (ref 1.5–4.5)
Glucose: 92 mg/dL (ref 70–99)
Potassium: 4 mmol/L (ref 3.5–5.2)
Sodium: 142 mmol/L (ref 134–144)
Total Protein: 7 g/dL (ref 6.0–8.5)
eGFR: 111 mL/min/{1.73_m2} (ref >59)

## 2021-04-09 LAB — CBC
Hematocrit: 39.3 % (ref 37.5–51.0)
Hemoglobin: 13.6 g/dL (ref 13.0–17.7)
MCH: 30.7 pg (ref 26.6–33.0)
MCHC: 34.6 g/dL (ref 31.5–35.7)
MCV: 89 fL (ref 79–97)
Platelets: 173 10*3/uL (ref 150–450)
RBC: 4.43 x10E6/uL (ref 4.14–5.80)
RDW: 11.9 % (ref 11.6–15.4)
WBC: 3.5 10*3/uL (ref 3.4–10.8)

## 2021-04-09 LAB — LIPID PANEL
Chol/HDL Ratio: 2.9 ratio (ref 0.0–5.0)
Cholesterol, Total: 162 mg/dL (ref 100–199)
HDL: 56 mg/dL (ref >39)
LDL Chol Calc (NIH): 91 mg/dL (ref 0–99)
Triglycerides: 80 mg/dL (ref 0–149)
VLDL Cholesterol Cal: 15 mg/dL (ref 5–40)

## 2021-04-09 LAB — HEMOGLOBIN A1C
Est. average glucose Bld gHb Est-mCnc: 103 mg/dL
Hgb A1c MFr Bld: 5.2 % (ref 4.8–5.6)

## 2021-04-17 ENCOUNTER — Ambulatory Visit: Payer: Self-pay | Attending: Internal Medicine

## 2021-04-17 ENCOUNTER — Other Ambulatory Visit: Payer: Self-pay

## 2021-04-22 ENCOUNTER — Other Ambulatory Visit: Payer: Self-pay

## 2021-04-23 ENCOUNTER — Ambulatory Visit (INDEPENDENT_AMBULATORY_CARE_PROVIDER_SITE_OTHER): Payer: Self-pay | Admitting: Cardiology

## 2021-04-23 ENCOUNTER — Other Ambulatory Visit: Payer: Self-pay

## 2021-04-23 ENCOUNTER — Encounter: Payer: Self-pay | Admitting: Cardiology

## 2021-04-23 VITALS — BP 112/74 | HR 90 | Ht 71.0 in | Wt 327.1 lb

## 2021-04-23 DIAGNOSIS — G4731 Primary central sleep apnea: Secondary | ICD-10-CM

## 2021-04-23 DIAGNOSIS — I10 Essential (primary) hypertension: Secondary | ICD-10-CM

## 2021-04-23 NOTE — Patient Instructions (Signed)
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
## 2021-05-06 ENCOUNTER — Other Ambulatory Visit: Payer: Self-pay

## 2021-05-06 ENCOUNTER — Telehealth: Payer: Self-pay | Admitting: Internal Medicine

## 2021-05-06 DIAGNOSIS — G4731 Primary central sleep apnea: Secondary | ICD-10-CM

## 2021-05-06 DIAGNOSIS — G4733 Obstructive sleep apnea (adult) (pediatric): Secondary | ICD-10-CM

## 2021-05-06 NOTE — Telephone Encounter (Signed)
Pt came by to request a message to Dr. Laural Benes that he has insurance worked out and now needs referral sent for Sleep Apnea test to be done. Advised message sent.

## 2021-05-06 NOTE — Telephone Encounter (Signed)
Will forward to provider to place order. Once order is placed [facility] will reach out to pt to schedule

## 2021-05-07 ENCOUNTER — Encounter: Payer: Self-pay | Admitting: Internal Medicine

## 2021-05-13 ENCOUNTER — Other Ambulatory Visit: Payer: Self-pay

## 2021-05-14 ENCOUNTER — Other Ambulatory Visit: Payer: Self-pay

## 2021-05-20 ENCOUNTER — Other Ambulatory Visit: Payer: Self-pay

## 2021-05-21 ENCOUNTER — Other Ambulatory Visit: Payer: Self-pay

## 2021-06-05 ENCOUNTER — Other Ambulatory Visit: Payer: Self-pay

## 2021-06-06 ENCOUNTER — Other Ambulatory Visit: Payer: Self-pay

## 2021-06-19 ENCOUNTER — Other Ambulatory Visit: Payer: Self-pay

## 2021-06-20 ENCOUNTER — Other Ambulatory Visit: Payer: Self-pay

## 2021-06-21 ENCOUNTER — Other Ambulatory Visit: Payer: Self-pay

## 2021-07-08 ENCOUNTER — Telehealth: Payer: Self-pay | Admitting: Internal Medicine

## 2021-07-08 ENCOUNTER — Other Ambulatory Visit: Payer: Self-pay

## 2021-07-08 DIAGNOSIS — Z8739 Personal history of other diseases of the musculoskeletal system and connective tissue: Secondary | ICD-10-CM

## 2021-07-08 DIAGNOSIS — M109 Gout, unspecified: Secondary | ICD-10-CM

## 2021-07-08 NOTE — Telephone Encounter (Signed)
Requesting early.  Requested Prescriptions  Pending Prescriptions Disp Refills   allopurinol (ZYLOPRIM) 300 MG tablet 30 tablet 2    Sig: Take 1 tablet (300 mg total) by mouth daily.     Endocrinology:  Gout Agents Failed - 07/08/2021 12:59 PM      Failed - Uric Acid in normal range and within 360 days    Uric Acid  Date Value Ref Range Status  01/12/2018 5.1 3.7 - 8.6 mg/dL Final    Comment:               Therapeutic target for gout patients: <6.0         Passed - Cr in normal range and within 360 days    Creatinine, Ser  Date Value Ref Range Status  04/08/2021 0.77 0.76 - 1.27 mg/dL Final         Passed - Valid encounter within last 12 months    Recent Outpatient Visits          3 months ago Essential hypertension   [facility] Marcine Matar, MD   11 months ago Essential hypertension   [place] [facility] Marcine Matar, MD   1 year ago Daily headache   [place] [facility] Marcine Matar, MD   1 year ago Hospital discharge follow-up   [facility] Marcine Matar, MD   1 year ago [place] vision, bilateral   [facility] [place], [place], [place]      Future Appointments            In 1 month Marcine Matar, MD [facility]   In 4 months Revankar, Aundra Dubin, MD [facility]            colchicine 0.6 MG tablet 30 tablet 2    Sig: TAKE 1 TABLET (0.6 MG TOTAL) BY MOUTH DAILY.     Endocrinology:  Gout Agents Failed - 07/08/2021 12:59 PM      Failed - Uric Acid in normal range and within 360 days    Uric Acid  Date Value Ref Range Status  01/12/2018 5.1 3.7 - 8.6 mg/dL Final    Comment:               Therapeutic target for gout patients: <6.0         Passed - Cr in normal range and within 360 days    Creatinine, Ser  Date Value Ref Range Status  04/08/2021 0.77 0.76 - 1.27  mg/dL Final         Passed - Valid encounter within last 12 months    Recent Outpatient Visits          3 months ago Essential hypertension   [place] [facility] Marcine Matar, MD   11 months ago Essential hypertension   [place] [facility] Marcine Matar, MD   1 year ago Daily headache    [facility], [facility]   1 year ago Hospital discharge follow-up   [facility] Marcine Matar, MD   1 year ago [place] vision, bilateral   [facility] [place], [place], [place]      Future Appointments            In 1 month  Marcine Matar, MD [facility]   In 4 months Revankar, Aundra Dubin, MD [facility]

## 2021-07-09 NOTE — Telephone Encounter (Signed)
Will forward to provider  

## 2021-07-10 ENCOUNTER — Other Ambulatory Visit: Payer: Self-pay

## 2021-07-10 MED ORDER — ALLOPURINOL 300 MG PO TABS
300.0000 mg | ORAL_TABLET | Freq: Every day | ORAL | 4 refills | Status: DC
  Filled 2021-07-10 – 2021-07-22 (×2): qty 30, 30d supply, fill #0
  Filled 2021-08-19: qty 30, 30d supply, fill #1
  Filled 2021-09-17: qty 30, 30d supply, fill #2
  Filled 2021-10-03: qty 30, 30d supply, fill #3
  Filled 2021-11-05: qty 30, 30d supply, fill #4

## 2021-07-10 NOTE — Addendum Note (Signed)
Addended by: Karle Plumber B on: 07/10/2021 09:12 AM   Modules accepted: Orders

## 2021-07-11 ENCOUNTER — Other Ambulatory Visit: Payer: Self-pay

## 2021-07-11 ENCOUNTER — Ambulatory Visit (HOSPITAL_BASED_OUTPATIENT_CLINIC_OR_DEPARTMENT_OTHER): Payer: Self-pay | Attending: Internal Medicine | Admitting: Internal Medicine

## 2021-07-11 DIAGNOSIS — G4733 Obstructive sleep apnea (adult) (pediatric): Secondary | ICD-10-CM | POA: Insufficient documentation

## 2021-07-11 DIAGNOSIS — G4731 Primary central sleep apnea: Secondary | ICD-10-CM

## 2021-07-14 DIAGNOSIS — G4733 Obstructive sleep apnea (adult) (pediatric): Secondary | ICD-10-CM

## 2021-07-14 NOTE — Procedures (Signed)
Patient Name: Wayne [name], Wayne [name] Date: 07/11/2021 Gender: Male D.O.B: [birth year] Age (years): 32 Referring Provider: [name] MD Height (inches): 71 Interpreting Physician: Jetty Duhamel MD, ABSM Weight (lbs): 310 RPSGT: Shelah Lewandowsky BMI: 43 MRN: [id number] Neck Size: 18.75  CLINICAL INFORMATION The patient is referred for a CPAP titration to treat sleep apnea.  Date of NPSG, Split Night or HST: NPSG 07/12/20  AHI 66.7/ hr, desaturation to a nadir of 68%, body weight 328 lbs  SLEEP STUDY TECHNIQUE As per the AASM Manual for the Scoring of Sleep and Associated Events v2.3 (April 2016) with a hypopnea requiring 4% desaturations.  The channels recorded and monitored were frontal, central and occipital EEG, electrooculogram (EOG), submentalis EMG (chin), nasal and oral airflow, thoracic and abdominal wall motion, anterior tibialis EMG, snore microphone, electrocardiogram, and pulse oximetry. Continuous positive airway pressure (CPAP) was initiated at the beginning of the study and titrated to treat sleep-disordered breathing.  MEDICATIONS Medications self-administered by patient taken the night of the study : none reported  TECHNICIAN COMMENTS Comments added by technician: Patient had difficulty initiating sleep. Comments added by scorer: N/A  RESPIRATORY PARAMETERS Optimal PAP Pressure (cm): 11 AHI at Optimal Pressure (/hr): 2.7 Overall Minimal O2 (%): 77.0 Supine % at Optimal Pressure (%): 38 Minimal O2 at Optimal Pressure (%): 87.0   SLEEP ARCHITECTURE The study was initiated at 10:24:27 PM and ended at 5:17:04 AM.  Sleep onset time was 131.2 minutes and the sleep efficiency was 56.5%%. The total sleep time was 233 minutes.  The patient spent 9.2%% of the night in stage N1 sleep, 79.8%% in stage N2 sleep, 0.0%% in stage N3 and 10.9% in REM.Stage REM latency was 103.0 minutes  Wake after sleep onset was 48.4. Alpha intrusion was absent. Supine sleep was  37.55%.  CARDIAC DATA The 2 lead EKG demonstrated sinus rhythm. The mean heart rate was 74.6 beats per minute. Other EKG findings include: PVCs.  LEG MOVEMENT DATA The total Periodic Limb Movements of Sleep (PLMS) were 0. The PLMS index was 0.0. A PLMS index of <15 is considered normal in adults.  IMPRESSIONS - The optimal PAP pressure was 11 cm of water. - Central sleep apnea was not noted during this titration (CAI = 4.9/h). - Oxygen desaturations were observed during this titration (min O2 = 77.0%).  Mean O2 saturation on CPAP 11 was 91.9%. - No snoring was audible during this study. - 2-lead EKG demonstrated: PVCs - Clinically significant periodic limb movements were not noted during this study. Arousals associated with PLMs were rare.  DIAGNOSIS - Obstructive Sleep Apnea (G47.33)  RECOMMENDATIONS - Trial of CPAP therapy on 11 cm H2O or autopap 5-15. - Patient used a Large size Fisher&Paykel Full Face Mask Simplus mask and heated humidification. - Be careful with alcohol, sedatives and other CNS depressants that may worsen sleep apnea and disrupt normal sleep architecture. - Sleep hygiene should be reviewed to assess factors that may improve sleep quality. Note patient had to be asked repeatedly to stop using his phone during the night. - Weight management and regular exercise should be initiated or continued.  [Electronically signed] 07/14/2021 12:06 PM  Jetty Duhamel MD, ABSM Diplomate, American Board of Sleep Medicine   NPI: [id number]                         Jetty Duhamel Diplomate, American Board of Sleep Medicine  ELECTRONICALLY SIGNED ON:  07/14/2021, 12:00 PM [facility] PH: [phone]   FX: [phone] [place]

## 2021-07-15 ENCOUNTER — Other Ambulatory Visit: Payer: Self-pay

## 2021-07-22 ENCOUNTER — Other Ambulatory Visit: Payer: Self-pay

## 2021-07-23 ENCOUNTER — Other Ambulatory Visit: Payer: Self-pay | Admitting: Internal Medicine

## 2021-07-23 ENCOUNTER — Other Ambulatory Visit: Payer: Self-pay

## 2021-07-23 DIAGNOSIS — Z8739 Personal history of other diseases of the musculoskeletal system and connective tissue: Secondary | ICD-10-CM

## 2021-07-24 ENCOUNTER — Other Ambulatory Visit: Payer: Self-pay

## 2021-07-24 MED ORDER — COLCHICINE 0.6 MG PO TABS
ORAL_TABLET | ORAL | 0 refills | Status: DC
  Filled 2021-07-24: qty 30, 30d supply, fill #0

## 2021-07-24 NOTE — Telephone Encounter (Signed)
Requested medications are due for refill today.  yes  Requested medications are on the active medications list.  yes  Last refill. 04/08/2021 #30 2 refills  Future visit scheduled.   yes  Notes to clinic.  Computer is giving a strong warning about drug interactions.    Requested Prescriptions  Pending Prescriptions Disp Refills   colchicine 0.6 MG tablet 30 tablet 2    Sig: TAKE 1 TABLET (0.6 MG TOTAL) BY MOUTH DAILY.     Endocrinology:  Gout Agents - colchicine Failed - 07/23/2021  4:24 PM      Failed - CBC within normal limits and completed in the last 12 months    WBC  Date Value Ref Range Status  04/08/2021 3.5 3.4 - 10.8 x10E3/uL Final  05/16/2020 4.4 4.0 - 10.5 K/uL Final   RBC  Date Value Ref Range Status  04/08/2021 4.43 4.14 - 5.80 x10E6/uL Final  05/16/2020 4.10 (L) 4.22 - 5.81 MIL/uL Final   Hemoglobin  Date Value Ref Range Status  04/08/2021 13.6 13.0 - 17.7 g/dL Final   HGB  Date Value Ref Range Status  10/13/2013 12.2 (L) 13.0 - 17.1 g/dL Final   Hemoglobin Other  Date Value Ref Range Status  10/13/2013 0.0 0.0 % Final    Comment:     Interpretation--------------Normal study. Reviewed by Odis Hollingshead, MD, PhD, FCAP (Electronic Signature onFile)   HCT  Date Value Ref Range Status  10/13/2013 37.4 (L) 38.7 - 49.9 % Final   Hematocrit  Date Value Ref Range Status  04/08/2021 39.3 37.5 - 51.0 % Final   MCHC  Date Value Ref Range Status  04/08/2021 34.6 31.5 - 35.7 g/dL Final  05/16/2020 31.4 30.0 - 36.0 g/dL Final   [facility]  Date Value Ref Range Status  04/08/2021 30.7 26.6 - 33.0 pg Final  05/16/2020 29.8 26.0 - 34.0 pg Final   MCV  Date Value Ref Range Status  04/08/2021 89 79 - 97 fL Final  10/13/2013 96 82 - 98 fL Final   No results found for: PLTCOUNTKUC, LABPLAT, POCPLA RDW  Date Value Ref Range Status  04/08/2021 11.9 11.6 - 15.4 % Final  10/13/2013 13.4 11.1 - 15.7 % Final         Passed - Cr in normal range and within 360  days    Creatinine, Ser  Date Value Ref Range Status  04/08/2021 0.77 0.76 - 1.27 mg/dL Final          Passed - ALT in normal range and within 360 days    ALT  Date Value Ref Range Status  04/08/2021 34 0 - 44 IU/L Final          Passed - AST in normal range and within 360 days    AST  Date Value Ref Range Status  04/08/2021 27 0 - 40 IU/L Final          Passed - Valid encounter within last 12 months    Recent Outpatient Visits           3 months ago Essential hypertension   [place] Ladell Pier, MD   12 months ago Essential hypertension   [place] Ladell Pier, MD   1 year ago Daily headache   [place], Deborah B, MD   1 year ago Hospital discharge follow-up   [place] Ladell Pier, MD  1 year ago [place] vision, bilateral   [place], Loraine Grip, PA-C       Future Appointments             In 2 weeks Wynetta Emery, Dalbert Batman, MD [place]   In 4 months Revankar, Reita Cliche, MD [facility]

## 2021-07-25 ENCOUNTER — Other Ambulatory Visit: Payer: Self-pay

## 2021-08-05 ENCOUNTER — Other Ambulatory Visit: Payer: Self-pay

## 2021-08-06 ENCOUNTER — Other Ambulatory Visit: Payer: Self-pay

## 2021-08-08 ENCOUNTER — Other Ambulatory Visit: Payer: Self-pay

## 2021-08-08 ENCOUNTER — Encounter: Payer: Self-pay | Admitting: Internal Medicine

## 2021-08-08 ENCOUNTER — Ambulatory Visit: Payer: Self-pay | Attending: Internal Medicine | Admitting: Internal Medicine

## 2021-08-08 VITALS — BP 106/70 | HR 89 | Resp 16 | Wt 331.0 lb

## 2021-08-08 DIAGNOSIS — G4733 Obstructive sleep apnea (adult) (pediatric): Secondary | ICD-10-CM

## 2021-08-08 DIAGNOSIS — Z8739 Personal history of other diseases of the musculoskeletal system and connective tissue: Secondary | ICD-10-CM

## 2021-08-08 DIAGNOSIS — I10 Essential (primary) hypertension: Secondary | ICD-10-CM

## 2021-08-08 DIAGNOSIS — Z0120 Encounter for dental examination and cleaning without abnormal findings: Secondary | ICD-10-CM
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
## 2021-08-08 NOTE — Patient Instructions (Signed)
Healthy Eating ?Following a healthy eating pattern may help you to achieve and maintain a healthy body weight, reduce the risk of chronic disease, and live a long and productive life. It is important to follow a healthy eating pattern at an appropriate calorie level for your body. Your nutritional needs should be met primarily through food by choosing a variety of nutrient-rich foods. ?What are tips for following this plan? ?Reading food labels ?Read labels and choose the following: ?Reduced or low sodium. ?Juices with 100% fruit juice. ?Foods with low saturated fats and high polyunsaturated and monounsaturated fats. ?Foods with whole grains, such as whole wheat, cracked wheat, brown rice, and wild rice. ?Whole grains that are fortified with folic acid. This is recommended for women who are pregnant or who want to become pregnant. ?Read labels and avoid the following: ?Foods with a lot of added sugars. These include foods that contain brown sugar, corn sweetener, corn syrup, dextrose, fructose, glucose, high-fructose corn syrup, honey, invert sugar, lactose, malt syrup, maltose, molasses, raw sugar, sucrose, trehalose, or turbinado sugar. ?Do not eat more than the following amounts of added sugar per day: ?6 teaspoons (25 g) for women. ?9 teaspoons (38 g) for men. ?Foods that contain processed or refined starches and grains. ?Refined grain products, such as white flour, degermed cornmeal, white bread, and white rice. ?Shopping ?Choose nutrient-rich snacks, such as vegetables, whole fruits, and nuts. Avoid high-calorie and high-sugar snacks, such as potato chips, fruit snacks, and candy. ?Use oil-based dressings and spreads on foods instead of solid fats such as butter, stick margarine, or cream cheese. ?Limit pre-made sauces, mixes, and "instant" products such as flavored rice, instant noodles, and ready-made pasta. ?Try more plant-protein sources, such as tofu, tempeh, black beans, edamame, lentils, nuts, and  seeds. ?Explore eating plans such as the Mediterranean diet or vegetarian diet. ?Cooking ?Use oil to saut? or stir-fry foods instead of solid fats such as butter, stick margarine, or lard. ?Try baking, boiling, grilling, or broiling instead of frying. ?Remove the fatty part of meats before cooking. ?Steam vegetables in water or broth. ?Meal planning ? ?At meals, imagine dividing your plate into fourths: ?One-half of your plate is fruits and vegetables. ?One-fourth of your plate is whole grains. ?One-fourth of your plate is protein, especially lean meats, poultry, eggs, tofu, beans, or nuts. ?Include low-fat dairy as part of your daily diet. ?Lifestyle ?Choose healthy options in all settings, including home, work, school, restaurants, or stores. ?Prepare your food safely: ?Wash your hands after handling raw meats. ?Keep food preparation surfaces clean by regularly washing with hot, soapy water. ?Keep raw meats separate from ready-to-eat foods, such as fruits and vegetables. ?Cook seafood, meat, poultry, and eggs to the recommended internal temperature. ?Store foods at safe temperatures. In general: ?Keep cold foods at 40?F (4.4?C) or below. ?Keep hot foods at 140?F (60?C) or above. ?Keep your freezer at 0?F (-17.8?C) or below. ?Foods are no longer safe to eat when they have been between the temperatures of 40?-140?F (4.4-60?C) for more than 2 hours. ?What foods should I eat? ?Fruits ?Aim to eat 2 cup-equivalents of fresh, canned (in natural juice), or frozen fruits each day. Examples of 1 cup-equivalent of fruit include 1 small apple, 8 large strawberries, 1 cup canned fruit, ? cup dried fruit, or 1 cup 100% juice. ?Vegetables ?Aim to eat 2?-3 cup-equivalents of fresh and frozen vegetables each day, including different varieties and colors. Examples of 1 cup-equivalent of vegetables include 2 medium carrots, 2 cups raw,  leafy greens, 1 cup chopped vegetable (raw or cooked), or 1 medium baked potato. ?Grains ?Aim to  eat 6 ounce-equivalents of whole grains each day. Examples of 1 ounce-equivalent of grains include 1 slice of bread, 1 cup ready-to-eat cereal, 3 cups popcorn, or ? cup cooked rice, pasta, or cereal. ?Meats and other proteins ?Aim to eat 5-6 ounce-equivalents of protein each day. Examples of 1 ounce-equivalent of protein include 1 egg, 1/2 cup nuts or seeds, or 1 tablespoon (16 g) peanut butter. A cut of meat or fish that is the size of a deck of cards is about 3-4 ounce-equivalents. ?Of the protein you eat each week, try to have at least 8 ounces come from seafood. This includes salmon, trout, herring, and anchovies. ?Dairy ?Aim to eat 3 cup-equivalents of fat-free or low-fat dairy each day. Examples of 1 cup-equivalent of dairy include 1 cup (240 mL) milk, 8 ounces (250 g) yogurt, 1? ounces (44 g) natural cheese, or 1 cup (240 mL) fortified soy milk. ?Fats and oils ?Aim for about 5 teaspoons (21 g) per day. Choose monounsaturated fats, such as canola and olive oils, avocados, peanut butter, and most nuts, or polyunsaturated fats, such as sunflower, corn, and soybean oils, walnuts, pine nuts, sesame seeds, sunflower seeds, and flaxseed. ?Beverages ?Aim for six 8-oz glasses of water per day. Limit coffee to three to five 8-oz cups per day. ?Limit caffeinated beverages that have added calories, such as soda and energy drinks. ?Limit alcohol intake to no more than 1 drink a day for nonpregnant women and 2 drinks a day for men. One drink equals 12 oz of beer (355 mL), 5 oz of wine (148 mL), or 1? oz of hard liquor (44 mL). ?Seasoning and other foods ?Avoid adding excess amounts of salt to your foods. Try flavoring foods with herbs and spices instead of salt. ?Avoid adding sugar to foods. ?Try using oil-based dressings, sauces, and spreads instead of solid fats. ?This information is based on general [place] nutrition guidelines. For more information, visit [employer]. Exact amounts may vary based on your nutrition  needs. ?Summary ?A healthy eating plan may help you to maintain a healthy weight, reduce the risk of chronic diseases, and stay active throughout your life. ?Plan your meals. Make sure you eat the right portions of a variety of nutrient-rich foods. ?Try baking, boiling, grilling, or broiling instead of frying. ?Choose healthy options in all settings, including home, work, school, restaurants, or stores. ?This information is not intended to replace advice given to you by your health care provider. Make sure you discuss any questions you have with your health care provider. ?Document Revised: 01/22/2021 Document Reviewed: 01/22/2021 ?Elsevier Patient Education ? [place]. ? ?

## 2021-08-09 ENCOUNTER — Telehealth: Payer: Self-pay

## 2021-08-09 NOTE — Telephone Encounter (Signed)
Order received for CPAP machine.Patient is uninsured.  ?  ?Call placed to patient and informed him about the [employer] ([employer]) CPAP Assistance Program now that it is up and running again post COVID.   They provide refurbished machines to individuals for a $100 program fee. There is no warranty or technical support that comes with the machine, it is provided " as is."  ?  ?The program currently reports a wait of about 1- 1.5 months for a machine to be available for delivery. The patient said that he may be able to afford the program fee.  Informed him that [employer] will contact him for payment. If he has difficulty paying at the time the fee is due, [facility] has funding available to assist with this.  He agreed to have  the machine delivered to [facility] and when  he picks up the machine, he will be asked to sign a waiver of liability for the [employer]   ?  ?He was in agreement with submitting  the order for the CPAP machine and the referral was then faxed to [employer].  ?

## 2021-08-12 ENCOUNTER — Other Ambulatory Visit: Payer: Self-pay

## 2021-08-13 ENCOUNTER — Other Ambulatory Visit: Payer: Self-pay

## 2021-08-16 ENCOUNTER — Other Ambulatory Visit: Payer: Self-pay

## 2021-08-19 ENCOUNTER — Other Ambulatory Visit: Payer: Self-pay | Admitting: Internal Medicine

## 2021-08-19 ENCOUNTER — Other Ambulatory Visit: Payer: Self-pay

## 2021-08-19 DIAGNOSIS — Z8739 Personal history of other diseases of the musculoskeletal system and connective tissue: Secondary | ICD-10-CM

## 2021-08-19 MED ORDER — COLCHICINE 0.6 MG PO TABS
ORAL_TABLET | ORAL | 3 refills | Status: DC
  Filled 2021-08-19: qty 30, 30d supply, fill #0
  Filled 2021-09-17: qty 30, 30d supply, fill #1
  Filled 2021-10-21: qty 30, 30d supply, fill #2
  Filled 2021-11-18: qty 30, 30d supply, fill #3
  Filled 2021-12-20: qty 30, 30d supply, fill #4
  Filled 2022-01-03: qty 90, 90d supply, fill #5
  Filled 2022-03-03 – 2022-04-14 (×2): qty 90, 90d supply, fill #6
  Filled 2022-06-10: qty 90, 90d supply, fill #7
  Filled 2022-07-11: qty 30, 30d supply, fill #7

## 2021-08-19 NOTE — Telephone Encounter (Signed)
Requested Prescriptions  ?Pending Prescriptions Disp Refills  ?? colchicine 0.6 MG tablet 90 tablet 3  ?  Sig: TAKE 1 TABLET (0.6 MG TOTAL) BY MOUTH DAILY.  ?  ? Endocrinology:  Gout Agents - colchicine Failed - 08/19/2021  9:25 AM  ?  ?  Failed - CBC within normal limits and completed in the last 12 months  ?  WBC  ?Date Value Ref Range Status  ?04/08/2021 3.5 3.4 - 10.8 x10E3/uL Final  ?05/16/2020 4.4 4.0 - 10.5 K/uL Final  ? ?RBC  ?Date Value Ref Range Status  ?04/08/2021 4.43 4.14 - 5.80 x10E6/uL Final  ?05/16/2020 4.10 (L) 4.22 - 5.81 MIL/uL Final  ? ?Hemoglobin  ?Date Value Ref Range Status  ?04/08/2021 13.6 13.0 - 17.7 g/dL Final  ? ?HGB  ?Date Value Ref Range Status  ?10/13/2013 12.2 (L) 13.0 - 17.1 g/dL Final  ? ?Hemoglobin Other  ?Date Value Ref Range Status  ?10/13/2013 0.0 0.0 % Final  ?  Comment:  ?   Interpretation--------------Normal study. Reviewed by Dallas Breeding, MD, PhD, FCAP (Electronic Signature onFile)  ? ?HCT  ?Date Value Ref Range Status  ?10/13/2013 37.4 (L) 38.7 - 49.9 % Final  ? ?Hematocrit  ?Date Value Ref Range Status  ?04/08/2021 39.3 37.5 - 51.0 % Final  ? ?MCHC  ?Date Value Ref Range Status  ?04/08/2021 34.6 31.5 - 35.7 g/dL Final  ?25/42/7062 37.6 30.0 - 36.0 g/dL Final  ? ?MCH  ?Date Value Ref Range Status  ?04/08/2021 30.7 26.6 - 33.0 pg Final  ?05/16/2020 29.8 26.0 - 34.0 pg Final  ? ?MCV  ?Date Value Ref Range Status  ?04/08/2021 89 79 - 97 fL Final  ?10/13/2013 96 82 - 98 fL Final  ? ?No results found for: PLTCOUNTKUC, LABPLAT, POCPLA ?RDW  ?Date Value Ref Range Status  ?04/08/2021 11.9 11.6 - 15.4 % Final  ?10/13/2013 13.4 11.1 - 15.7 % Final  ? ?  ?  ?  Passed - Cr in normal range and within 360 days  ?  Creatinine, Ser  ?Date Value Ref Range Status  ?04/08/2021 0.77 0.76 - 1.27 mg/dL Final  ?   ?  ?  Passed - ALT in normal range and within 360 days  ?  ALT  ?Date Value Ref Range Status  ?04/08/2021 34 0 - 44 IU/L Final  ?   ?  ?  Passed - AST in normal range and within  360 days  ?  AST  ?Date Value Ref Range Status  ?04/08/2021 27 0 - 40 IU/L Final  ?   ?  ?  Passed - Valid encounter within last 12 months  ?  Recent Outpatient Visits   ?      ? 1 week ago Essential hypertension  ? [facility] Marcine Matar, MD  ? 4 months ago Essential hypertension  ? [facility] Marcine Matar, MD  ? 1 year ago Essential hypertension  ? [facility] Marcine Matar, MD  ? 1 year ago Daily headache  ? [facility] Marcine Matar, MD  ? 1 year ago Hospital discharge follow-up  ? [facility] Marcine Matar, MD  ?  ?  ?Future Appointments   ?        ? In 3 months Revankar, Aundra Dubin, MD [facility] [place]  ? In 3 months [name]  Leonard Schwartz, MD [facility]  ?  ? ?  ?  ?  ? ?

## 2021-08-20 ENCOUNTER — Other Ambulatory Visit: Payer: Self-pay

## 2021-09-03 ENCOUNTER — Other Ambulatory Visit: Payer: Self-pay

## 2021-09-04 ENCOUNTER — Other Ambulatory Visit: Payer: Self-pay

## 2021-09-09 ENCOUNTER — Other Ambulatory Visit: Payer: Self-pay

## 2021-09-10 ENCOUNTER — Other Ambulatory Visit: Payer: Self-pay

## 2021-09-17 ENCOUNTER — Other Ambulatory Visit: Payer: Self-pay

## 2021-10-03 ENCOUNTER — Other Ambulatory Visit: Payer: Self-pay

## 2021-10-13 ENCOUNTER — Other Ambulatory Visit: Payer: Self-pay | Admitting: Internal Medicine

## 2021-10-13 DIAGNOSIS — I10 Essential (primary) hypertension: Secondary | ICD-10-CM

## 2021-10-14 ENCOUNTER — Other Ambulatory Visit: Payer: Self-pay

## 2021-10-15 ENCOUNTER — Other Ambulatory Visit: Payer: Self-pay

## 2021-10-15 MED ORDER — AMLODIPINE BESYLATE 10 MG PO TABS
10.0000 mg | ORAL_TABLET | Freq: Every day | ORAL | 0 refills | Status: DC
  Filled 2021-10-15: qty 90, 90d supply, fill #0
  Filled 2021-11-05: qty 30, 30d supply, fill #0
  Filled 2021-12-05: qty 30, 30d supply, fill #1

## 2021-10-15 NOTE — Telephone Encounter (Signed)
Requested Prescriptions  ?Pending Prescriptions Disp Refills  ?? amLODipine (NORVASC) 10 MG tablet 90 tablet 0  ?  Sig: Take 1 tablet (10 mg total) by mouth daily.  ?  ? Cardiovascular: Calcium Channel Blockers 2 Passed - 10/13/2021 11:10 PM  ?  ?  Passed - Last BP in normal range  ?  BP Readings from Last 1 Encounters:  ?08/08/21 106/70  ?   ?  ?  Passed - Last Heart Rate in normal range  ?  Pulse Readings from Last 1 Encounters:  ?08/08/21 89  ?   ?  ?  Passed - Valid encounter within last 6 months  ?  Recent Outpatient Visits   ?      ? 2 months ago Essential hypertension  ? [facility] Marcine Matar, MD  ? 6 months ago Essential hypertension  ? [facility] Marcine Matar, MD  ? 1 year ago Essential hypertension  ? [facility] Marcine Matar, MD  ? 1 year ago Daily headache  ? [facility] Marcine Matar, MD  ? 1 year ago Hospital discharge follow-up  ? [facility] Marcine Matar, MD  ?  ?  ?Future Appointments   ?        ? In 1 month Revankar, Aundra Dubin, MD [facility] [place]  ? In 1 month Marcine Matar, MD [facility]  ?  ? ?  ?  ?  ? ? ?

## 2021-10-17 ENCOUNTER — Other Ambulatory Visit: Payer: Self-pay

## 2021-10-21 ENCOUNTER — Other Ambulatory Visit: Payer: Self-pay

## 2021-10-22 ENCOUNTER — Other Ambulatory Visit: Payer: Self-pay

## 2021-11-05 ENCOUNTER — Other Ambulatory Visit: Payer: Self-pay

## 2021-11-11 ENCOUNTER — Other Ambulatory Visit: Payer: Self-pay

## 2021-11-11 ENCOUNTER — Other Ambulatory Visit: Payer: Self-pay | Admitting: Internal Medicine

## 2021-11-11 DIAGNOSIS — I10 Essential (primary) hypertension: Secondary | ICD-10-CM

## 2021-11-11 MED ORDER — HYDROCHLOROTHIAZIDE 12.5 MG PO TABS
12.5000 mg | ORAL_TABLET | Freq: Every day | ORAL | 1 refills | Status: DC
  Filled 2021-11-11: qty 30, 30d supply, fill #0
  Filled 2021-12-05: qty 30, 30d supply, fill #1

## 2021-11-12 ENCOUNTER — Encounter: Payer: Self-pay | Attending: Internal Medicine | Admitting: Registered"

## 2021-11-12 ENCOUNTER — Encounter: Payer: Self-pay | Admitting: Registered"

## 2021-11-12 DIAGNOSIS — E669 Obesity, unspecified: Secondary | ICD-10-CM | POA: Insufficient documentation

## 2021-11-12 NOTE — Patient Instructions (Addendum)
-   Aim to have balanced meals for breakfast, lunch, and dinner. See handout as a guide.
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
## 2021-11-18 ENCOUNTER — Other Ambulatory Visit: Payer: Self-pay

## 2021-11-20 ENCOUNTER — Encounter: Payer: Self-pay | Admitting: Internal Medicine

## 2021-11-20 ENCOUNTER — Other Ambulatory Visit: Payer: Self-pay | Admitting: Internal Medicine

## 2021-11-20 DIAGNOSIS — M7502 Adhesive capsulitis of left shoulder: Secondary | ICD-10-CM

## 2021-11-22 ENCOUNTER — Other Ambulatory Visit: Payer: Self-pay

## 2021-11-26 ENCOUNTER — Ambulatory Visit (INDEPENDENT_AMBULATORY_CARE_PROVIDER_SITE_OTHER): Payer: Self-pay | Admitting: Cardiology

## 2021-11-26 ENCOUNTER — Encounter: Payer: Self-pay | Admitting: Cardiology

## 2021-11-26 VITALS — BP 132/80 | HR 77 | Ht 71.0 in | Wt 325.1 lb

## 2021-11-26 DIAGNOSIS — G4733 Obstructive sleep apnea (adult) (pediatric): Secondary | ICD-10-CM

## 2021-11-26 DIAGNOSIS — I10 Essential (primary) hypertension: Secondary | ICD-10-CM

## 2021-11-26 DIAGNOSIS — R5383 Other fatigue: Secondary | ICD-10-CM

## 2021-11-26 DIAGNOSIS — I480 Paroxysmal atrial fibrillation: Secondary | ICD-10-CM

## 2021-11-26 DIAGNOSIS — D649 Anemia, unspecified: Secondary | ICD-10-CM
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
## 2021-11-26 NOTE — Patient Instructions (Addendum)
Medication Instructions:  Your physician recommends that you continue on your current medications as directed. Please refer to the Current Medication list given to you today.  *If you need a refill on your cardiac medications before your next appointment, please call your pharmacy*   Lab Work:  You need to have labs done when you are fasting.  You can come Monday through Friday 8:30 am to 11:30 am and 1:15 to 4:30. Thursday and Friday 8:30-11:30 only.  You do not need to make an appointment as the order has already been placed. The labs you are going to have done are BMET, CBC, TSH, LFT and Lipids, HgbA1C, Vitamin D   Testing/Procedures: None Ordered   Follow-Up: At [facility], you and your health needs are our priority.  As part of our continuing mission to provide you with exceptional heart care, we have created designated Provider Care Teams.  These Care Teams include your primary Cardiologist (physician) and Advanced Practice Providers (APPs -  Physician Assistants and Nurse Practitioners) who all work together to provide you with the care you need, when you need it.  We recommend signing up for the patient portal called "MyChart".  Sign up information is provided on this After Visit Summary.  MyChart is used to connect with patients for Virtual Visits (Telemedicine).  Patients are able to view lab/test results, encounter notes, upcoming appointments, etc.  Non-urgent messages can be sent to your provider as well.   To learn more about what you can do with MyChart, go to [email or web address].    Your next appointment:   9 month(s)  The format for your next appointment:   In Person  Provider:   Belva Crome, MD    Other Instructions NA

## 2021-12-05 ENCOUNTER — Other Ambulatory Visit: Payer: Self-pay

## 2021-12-06 ENCOUNTER — Other Ambulatory Visit: Payer: Self-pay

## 2021-12-11 ENCOUNTER — Ambulatory Visit (INDEPENDENT_AMBULATORY_CARE_PROVIDER_SITE_OTHER): Payer: Self-pay | Admitting: Orthopedic Surgery

## 2021-12-11 ENCOUNTER — Ambulatory Visit: Payer: Self-pay

## 2021-12-11 ENCOUNTER — Ambulatory Visit (INDEPENDENT_AMBULATORY_CARE_PROVIDER_SITE_OTHER): Payer: Self-pay

## 2021-12-11 ENCOUNTER — Encounter: Payer: Self-pay | Admitting: Orthopedic Surgery

## 2021-12-11 DIAGNOSIS — M25552 Pain in left hip: Secondary | ICD-10-CM

## 2021-12-11 DIAGNOSIS — M7502 Adhesive capsulitis of left shoulder: Secondary | ICD-10-CM
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
## 2021-12-12 ENCOUNTER — Other Ambulatory Visit: Payer: Self-pay | Admitting: Internal Medicine

## 2021-12-12 ENCOUNTER — Ambulatory Visit: Payer: Self-pay | Attending: Internal Medicine | Admitting: Internal Medicine

## 2021-12-12 ENCOUNTER — Encounter: Payer: Self-pay | Admitting: Internal Medicine

## 2021-12-12 VITALS — BP 125/82 | HR 68 | Temp 98.2°F | Ht 71.0 in | Wt 321.4 lb

## 2021-12-12 DIAGNOSIS — M109 Gout, unspecified: Secondary | ICD-10-CM

## 2021-12-12 DIAGNOSIS — I10 Essential (primary) hypertension: Secondary | ICD-10-CM

## 2021-12-12 DIAGNOSIS — G4733 Obstructive sleep apnea (adult) (pediatric): Secondary | ICD-10-CM

## 2021-12-12 MED ORDER — LIDOCAINE HCL 1 % IJ SOLN
5.0000 mL | INTRAMUSCULAR | Status: AC | PRN
  Administered 2021-12-11: 5 mL

## 2021-12-12 MED ORDER — ALLOPURINOL 300 MG PO TABS
300.0000 mg | ORAL_TABLET | Freq: Every day | ORAL | 4 refills | Status: DC
  Filled 2021-12-12: qty 30, 30d supply, fill #0
  Filled 2022-01-03: qty 30, 30d supply, fill #1
  Filled 2022-01-30: qty 30, 30d supply, fill #2
  Filled 2022-02-17: qty 30, 30d supply, fill #3
  Filled 2022-04-01 – 2022-04-03 (×2): qty 30, 30d supply, fill #4

## 2021-12-12 MED ORDER — HYDROCHLOROTHIAZIDE 12.5 MG PO TABS
12.5000 mg | ORAL_TABLET | Freq: Every day | ORAL | 1 refills | Status: DC
  Filled 2021-12-12: qty 90, 90d supply, fill #0
  Filled 2022-01-03: qty 30, 30d supply, fill #0
  Filled 2022-02-04: qty 30, 30d supply, fill #1
  Filled 2022-03-03: qty 30, 30d supply, fill #2
  Filled 2022-04-01 – 2022-04-03 (×2): qty 30, 30d supply, fill #3
  Filled 2022-04-28 – 2022-05-05 (×2): qty 30, 30d supply, fill #4
  Filled 2022-06-10 (×2): qty 30, 30d supply, fill #5

## 2021-12-12 MED ORDER — AMLODIPINE BESYLATE 10 MG PO TABS
10.0000 mg | ORAL_TABLET | Freq: Every day | ORAL | 1 refills | Status: DC
  Filled 2021-12-12: qty 90, 90d supply, fill #0
  Filled 2022-01-03: qty 30, 30d supply, fill #0
  Filled 2022-01-30: qty 30, 30d supply, fill #1
  Filled 2022-02-17: qty 30, 30d supply, fill #2
  Filled 2022-04-01 – 2022-04-03 (×2): qty 30, 30d supply, fill #3
  Filled 2022-04-28 – 2022-05-05 (×2): qty 30, 30d supply, fill #4
  Filled 2022-06-10 (×3): qty 30, 30d supply, fill #5

## 2021-12-12 MED ORDER — CARVEDILOL 6.25 MG PO TABS
6.2500 mg | ORAL_TABLET | Freq: Two times a day (BID) | ORAL | 2 refills | Status: DC
  Filled 2021-12-12: qty 180, 90d supply, fill #0
  Filled 2022-03-03: qty 180, 90d supply, fill #1
  Filled 2022-04-28 – 2022-06-10 (×5): qty 180, 90d supply, fill #2

## 2021-12-12 MED ORDER — BUPIVACAINE HCL 0.5 % IJ SOLN
9.0000 mL | INTRAMUSCULAR | Status: AC | PRN
  Administered 2021-12-11: 9 mL via INTRA_ARTICULAR

## 2021-12-12 MED ORDER — LOSARTAN POTASSIUM 100 MG PO TABS
ORAL_TABLET | Freq: Every day | ORAL | 2 refills | Status: DC
  Filled 2021-12-12: qty 90, 90d supply, fill #0
  Filled 2022-03-03: qty 90, 90d supply, fill #1
  Filled 2022-04-28 – 2022-06-10 (×4): qty 90, 90d supply, fill #2

## 2021-12-12 MED ORDER — METHYLPREDNISOLONE ACETATE 40 MG/ML IJ SUSP
40.0000 mg | INTRAMUSCULAR | Status: AC | PRN
  Administered 2021-12-11: 40 mg via INTRA_ARTICULAR
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
## 2021-12-12 NOTE — Progress Notes (Signed)
Discuss recent shoulder injection.

## 2021-12-13 ENCOUNTER — Other Ambulatory Visit: Payer: Self-pay

## 2021-12-20 ENCOUNTER — Other Ambulatory Visit: Payer: Self-pay

## 2021-12-25 LAB — LIPID PANEL
Chol/HDL Ratio: 4.3 ratio (ref 0.0–5.0)
Cholesterol, Total: 209 mg/dL — ABNORMAL HIGH (ref 100–199)
HDL: 49 mg/dL (ref >39)
LDL Chol Calc (NIH): 138 mg/dL — ABNORMAL HIGH (ref 0–99)
Triglycerides: 121 mg/dL (ref 0–149)
VLDL Cholesterol Cal: 22 mg/dL (ref 5–40)

## 2021-12-25 LAB — BASIC METABOLIC PANEL
BUN/Creatinine Ratio: 9 (ref 9–20)
BUN: 10 mg/dL (ref 6–24)
CO2: 22 mmol/L (ref 20–29)
Calcium: 9.7 mg/dL (ref 8.7–10.2)
Chloride: 100 mmol/L (ref 96–106)
Creatinine, Ser: 1.08 mg/dL (ref 0.76–1.27)
Glucose: 95 mg/dL (ref 70–99)
Potassium: 3.7 mmol/L (ref 3.5–5.2)
Sodium: 139 mmol/L (ref 134–144)
eGFR: 85 mL/min/{1.73_m2} (ref >59)

## 2021-12-25 LAB — CBC
Hematocrit: 41 % (ref 37.5–51.0)
Hemoglobin: 14.1 g/dL (ref 13.0–17.7)
MCH: 30.5 pg (ref 26.6–33.0)
MCHC: 34.4 g/dL (ref 31.5–35.7)
MCV: 89 fL (ref 79–97)
Platelets: 185 10*3/uL (ref 150–450)
RBC: 4.62 x10E6/uL (ref 4.14–5.80)
RDW: 11.2 % — ABNORMAL LOW (ref 11.6–15.4)
WBC: 3.8 10*3/uL (ref 3.4–10.8)

## 2021-12-25 LAB — HEPATIC FUNCTION PANEL
ALT: 27 IU/L (ref 0–44)
AST: 32 IU/L (ref 0–40)
Albumin: 4.5 g/dL (ref 4.1–5.1)
Alkaline Phosphatase: 57 IU/L (ref 44–121)
Bilirubin Total: 0.9 mg/dL (ref 0.0–1.2)
Bilirubin, Direct: 0.21 mg/dL (ref 0.00–0.40)
Total Protein: 7.5 g/dL (ref 6.0–8.5)

## 2021-12-25 LAB — HEMOGLOBIN A1C
Est. average glucose Bld gHb Est-mCnc: 103 mg/dL
Hgb A1c MFr Bld: 5.2 % (ref 4.8–5.6)

## 2021-12-25 LAB — TSH: TSH: 2.01 u[IU]/mL (ref 0.450–4.500)

## 2021-12-25 LAB — VITAMIN D 25 HYDROXY (VIT D DEFICIENCY, FRACTURES): Vit D, 25-Hydroxy: 30.1 ng/mL (ref 30.0–100.0)

## 2022-01-03 ENCOUNTER — Other Ambulatory Visit: Payer: Self-pay

## 2022-01-09 ENCOUNTER — Telehealth: Payer: Self-pay

## 2022-01-09 NOTE — Telephone Encounter (Signed)
I spoke to Wayne [name]/ [employer] and paid the $100 donation for patient's CPAP machine on behalf of [facility]. Wayne said she will process the payment and send the machine to [facility] -[address] - [address], [place], [place]. 

## 2022-01-21 NOTE — Telephone Encounter (Signed)
Call placed to patient and informed him that his CPAP machine has been delivered to [facility] from the [employer].  [facility] will be contacting him to schedule an appointment for educating him about the use and care of the machine and he will pick it up at that time  He verbalized understanding.

## 2022-01-22 ENCOUNTER — Ambulatory Visit: Payer: Self-pay

## 2022-01-22 ENCOUNTER — Ambulatory Visit (INDEPENDENT_AMBULATORY_CARE_PROVIDER_SITE_OTHER): Payer: Self-pay | Admitting: Orthopedic Surgery

## 2022-01-22 DIAGNOSIS — M1612 Unilateral primary osteoarthritis, left hip: Secondary | ICD-10-CM

## 2022-01-26 ENCOUNTER — Encounter: Payer: Self-pay | Admitting: Orthopedic Surgery

## 2022-01-26 MED ORDER — METHYLPREDNISOLONE ACETATE 40 MG/ML IJ SUSP
40.0000 mg | INTRAMUSCULAR | Status: AC | PRN
  Administered 2022-01-22: 40 mg via INTRA_ARTICULAR

## 2022-01-26 MED ORDER — BUPIVACAINE HCL 0.25 % IJ SOLN
4.0000 mL | INTRAMUSCULAR | Status: AC | PRN
  Administered 2022-01-22: 4 mL via INTRA_ARTICULAR

## 2022-01-26 MED ORDER — LIDOCAINE HCL 1 % IJ SOLN
5.0000 mL | INTRAMUSCULAR | Status: AC | PRN
  Administered 2022-01-22: 5 mL
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
## 2022-01-27 ENCOUNTER — Encounter (HOSPITAL_BASED_OUTPATIENT_CLINIC_OR_DEPARTMENT_OTHER): Payer: Self-pay

## 2022-01-27 DIAGNOSIS — G4733 Obstructive sleep apnea (adult) (pediatric): Secondary | ICD-10-CM

## 2022-01-30 ENCOUNTER — Other Ambulatory Visit: Payer: Self-pay

## 2022-01-31 ENCOUNTER — Other Ambulatory Visit: Payer: Self-pay

## 2022-02-04 ENCOUNTER — Other Ambulatory Visit: Payer: Self-pay

## 2022-02-06 ENCOUNTER — Other Ambulatory Visit: Payer: Self-pay

## 2022-02-17 ENCOUNTER — Other Ambulatory Visit: Payer: Self-pay

## 2022-02-18 ENCOUNTER — Other Ambulatory Visit: Payer: Self-pay

## 2022-02-21 ENCOUNTER — Ambulatory Visit (HOSPITAL_BASED_OUTPATIENT_CLINIC_OR_DEPARTMENT_OTHER): Payer: Self-pay | Attending: Internal Medicine | Admitting: Internal Medicine

## 2022-02-21 DIAGNOSIS — G4733 Obstructive sleep apnea (adult) (pediatric): Secondary | ICD-10-CM

## 2022-03-03 ENCOUNTER — Other Ambulatory Visit: Payer: Self-pay

## 2022-04-01 ENCOUNTER — Other Ambulatory Visit (HOSPITAL_COMMUNITY): Payer: Self-pay

## 2022-04-03 ENCOUNTER — Other Ambulatory Visit: Payer: Self-pay

## 2022-04-11 ENCOUNTER — Other Ambulatory Visit (HOSPITAL_COMMUNITY): Payer: Self-pay

## 2022-04-14 ENCOUNTER — Other Ambulatory Visit: Payer: Self-pay

## 2022-04-17 ENCOUNTER — Ambulatory Visit: Payer: Self-pay | Attending: Internal Medicine | Admitting: Internal Medicine

## 2022-04-17 ENCOUNTER — Encounter: Payer: Self-pay | Admitting: Internal Medicine

## 2022-04-17 ENCOUNTER — Other Ambulatory Visit (HOSPITAL_COMMUNITY): Payer: Self-pay

## 2022-04-17 VITALS — BP 124/82 | HR 71 | Temp 98.3°F | Ht 71.0 in | Wt 326.0 lb

## 2022-04-17 DIAGNOSIS — I10 Essential (primary) hypertension: Secondary | ICD-10-CM

## 2022-04-17 DIAGNOSIS — G4733 Obstructive sleep apnea (adult) (pediatric): Secondary | ICD-10-CM

## 2022-04-17 DIAGNOSIS — M542 Cervicalgia: Secondary | ICD-10-CM

## 2022-04-17 DIAGNOSIS — Z1331 Encounter for screening for depression: Secondary | ICD-10-CM

## 2022-04-17 DIAGNOSIS — Z1211 Encounter for screening for malignant neoplasm of colon: Secondary | ICD-10-CM

## 2022-04-17 DIAGNOSIS — Z23 Encounter for immunization: Secondary | ICD-10-CM
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
## 2022-04-17 NOTE — Patient Instructions (Signed)

## 2022-04-20 LAB — FECAL OCCULT BLOOD, IMMUNOCHEMICAL: Fecal Occult Bld: NEGATIVE

## 2022-04-28 ENCOUNTER — Other Ambulatory Visit: Payer: Self-pay

## 2022-04-28 ENCOUNTER — Other Ambulatory Visit: Payer: Self-pay | Admitting: Internal Medicine

## 2022-04-28 DIAGNOSIS — M109 Gout, unspecified: Secondary | ICD-10-CM

## 2022-04-28 MED ORDER — ALLOPURINOL 300 MG PO TABS
300.0000 mg | ORAL_TABLET | Freq: Every day | ORAL | 4 refills | Status: DC
  Filled 2022-04-28 – 2022-05-05 (×2): qty 30, 30d supply, fill #0
  Filled 2022-06-10 (×2): qty 30, 30d supply, fill #1
  Filled 2022-07-03 (×2): qty 30, 30d supply, fill #2
  Filled 2022-08-18 (×2): qty 30, 30d supply, fill #3
  Filled 2022-09-15 (×2): qty 30, 30d supply, fill #4

## 2022-05-02 ENCOUNTER — Other Ambulatory Visit: Payer: Self-pay

## 2022-05-05 ENCOUNTER — Other Ambulatory Visit: Payer: Self-pay

## 2022-05-06 ENCOUNTER — Other Ambulatory Visit: Payer: Self-pay

## 2022-05-07 ENCOUNTER — Other Ambulatory Visit: Payer: Self-pay

## 2022-06-10 ENCOUNTER — Other Ambulatory Visit: Payer: Self-pay

## 2022-06-23 ENCOUNTER — Ambulatory Visit (HOSPITAL_BASED_OUTPATIENT_CLINIC_OR_DEPARTMENT_OTHER): Payer: Self-pay | Attending: Internal Medicine | Admitting: Radiology

## 2022-06-23 DIAGNOSIS — G4733 Obstructive sleep apnea (adult) (pediatric): Secondary | ICD-10-CM

## 2022-07-03 ENCOUNTER — Other Ambulatory Visit: Payer: Self-pay

## 2022-07-03 ENCOUNTER — Other Ambulatory Visit: Payer: Self-pay | Admitting: Internal Medicine

## 2022-07-03 DIAGNOSIS — I10 Essential (primary) hypertension: Secondary | ICD-10-CM

## 2022-07-03 MED ORDER — HYDROCHLOROTHIAZIDE 12.5 MG PO TABS
12.5000 mg | ORAL_TABLET | Freq: Every day | ORAL | 0 refills | Status: DC
  Filled 2022-07-03 – 2022-07-10 (×2): qty 90, 90d supply, fill #0

## 2022-07-03 MED ORDER — AMLODIPINE BESYLATE 10 MG PO TABS
10.0000 mg | ORAL_TABLET | Freq: Every day | ORAL | 0 refills | Status: DC
  Filled 2022-07-03 – 2022-07-10 (×2): qty 90, 90d supply, fill #0

## 2022-07-03 NOTE — Telephone Encounter (Signed)
Requested Prescriptions  Pending Prescriptions Disp Refills   amLODipine (NORVASC) 10 MG tablet 90 tablet 0    Sig: Take 1 tablet (10 mg total) by mouth daily.     Cardiovascular: Calcium Channel Blockers 2 Passed - 07/03/2022 12:03 PM      Passed - Last BP in normal range    BP Readings from Last 1 Encounters:  04/17/22 124/82         Passed - Last Heart Rate in normal range    Pulse Readings from Last 1 Encounters:  04/17/22 71         Passed - Valid encounter within last 6 months    Recent Outpatient Visits           2 months ago Essential hypertension   [place], MD   6 months ago Essential hypertension   [place], MD   10 months ago Essential hypertension   [place], Deborah B, MD   1 year ago Essential hypertension   [place], Deborah B, MD   1 year ago Essential hypertension   [place], MD       Future Appointments             In 1 month Ladell Pier, MD [place]             hydrochlorothiazide (HYDRODIURIL) 12.5 MG tablet 90 tablet 0    Sig: Take 1 tablet (12.5 mg total) by mouth daily.     Cardiovascular: Diuretics - Thiazide Failed - 07/03/2022 12:03 PM      Failed - Cr in normal range and within 180 days    Creatinine, Ser  Date Value Ref Range Status  12/24/2021 1.08 0.76 - 1.27 mg/dL Final         Failed - K in normal range and within 180 days    Potassium  Date Value Ref Range Status  12/24/2021 3.7 3.5 - 5.2 mmol/L Final         Failed - Na in normal range and within 180 days    Sodium  Date Value Ref Range Status  12/24/2021 139 134 - 144 mmol/L Final         Passed - Last BP in normal range    BP Readings from Last 1 Encounters:   04/17/22 124/82         Passed - Valid encounter within last 6 months    Recent Outpatient Visits           2 months ago Essential hypertension   [place], MD   6 months ago Essential hypertension   [place], MD   10 months ago Essential hypertension   [place], Deborah B, MD   1 year ago Essential hypertension   [place], Deborah B, MD   1 year ago Essential hypertension   [place], MD       Future Appointments             In 1 month Wynetta Emery Dalbert [place], MD [facility]  [place]

## 2022-07-07 ENCOUNTER — Other Ambulatory Visit: Payer: Self-pay

## 2022-07-10 ENCOUNTER — Other Ambulatory Visit: Payer: Self-pay

## 2022-07-11 ENCOUNTER — Other Ambulatory Visit: Payer: Self-pay

## 2022-07-27 ENCOUNTER — Other Ambulatory Visit: Payer: Self-pay

## 2022-07-27 ENCOUNTER — Emergency Department (HOSPITAL_COMMUNITY): Payer: Self-pay

## 2022-07-27 ENCOUNTER — Emergency Department (HOSPITAL_COMMUNITY)
Admission: EM | Admit: 2022-07-27 | Discharge: 2022-07-27 | Disposition: A | Discharge status: Home / Self Care | Payer: Self-pay | Attending: Emergency Medicine | Admitting: Emergency Medicine

## 2022-07-27 ENCOUNTER — Encounter (HOSPITAL_COMMUNITY): Payer: Self-pay | Admitting: Emergency Medicine

## 2022-07-27 DIAGNOSIS — N3001 Acute cystitis with hematuria: Secondary | ICD-10-CM | POA: Insufficient documentation

## 2022-07-27 DIAGNOSIS — Z1152 Encounter for screening for COVID-19: Secondary | ICD-10-CM | POA: Insufficient documentation

## 2022-07-27 DIAGNOSIS — Z79899 Other long term (current) drug therapy: Secondary | ICD-10-CM | POA: Insufficient documentation

## 2022-07-27 DIAGNOSIS — I10 Essential (primary) hypertension: Secondary | ICD-10-CM | POA: Insufficient documentation

## 2022-07-27 DIAGNOSIS — D649 Anemia, unspecified: Secondary | ICD-10-CM | POA: Insufficient documentation

## 2022-07-27 LAB — CBC WITH DIFFERENTIAL/PLATELET
Abs Immature Granulocytes: 0.01 10*3/uL (ref 0.00–0.07)
Basophils Absolute: 0 10*3/uL (ref 0.0–0.1)
Basophils Relative: 1 %
Eosinophils Absolute: 0 10*3/uL (ref 0.0–0.5)
Eosinophils Relative: 1 %
HCT: 40.6 % (ref 39.0–52.0)
Hemoglobin: 13.2 g/dL (ref 13.0–17.0)
Immature Granulocytes: 0 %
Lymphocytes Relative: 17 %
Lymphs Abs: 0.7 10*3/uL (ref 0.7–4.0)
MCH: 30.6 pg (ref 26.0–34.0)
MCHC: 32.5 g/dL (ref 30.0–36.0)
MCV: 94 fL (ref 80.0–100.0)
Monocytes Absolute: 0.4 10*3/uL (ref 0.1–1.0)
Monocytes Relative: 9 %
Neutro Abs: 3.2 10*3/uL (ref 1.7–7.7)
Neutrophils Relative %: 72 %
Platelets: 154 10*3/uL (ref 150–400)
RBC: 4.32 MIL/uL (ref 4.22–5.81)
RDW: 11.9 % (ref 11.5–15.5)
WBC: 4.4 10*3/uL (ref 4.0–10.5)
nRBC: 0 % (ref 0.0–0.2)

## 2022-07-27 LAB — COMPREHENSIVE METABOLIC PANEL
ALT: 25 U/L (ref 0–44)
AST: 29 U/L (ref 15–41)
Albumin: 3.7 g/dL (ref 3.5–5.0)
Alkaline Phosphatase: 46 U/L (ref 38–126)
Anion gap: 10 (ref 5–15)
BUN: 11 mg/dL (ref 6–20)
CO2: 27 mmol/L (ref 22–32)
Calcium: 9 mg/dL (ref 8.9–10.3)
Chloride: 97 mmol/L — ABNORMAL LOW (ref 98–111)
Creatinine, Ser: 1.08 mg/dL (ref 0.61–1.24)
GFR, Estimated: >60 mL/min (ref >60)
Glucose, Bld: 112 mg/dL — ABNORMAL HIGH (ref 70–99)
Potassium: 3.7 mmol/L (ref 3.5–5.1)
Sodium: 134 mmol/L — ABNORMAL LOW (ref 135–145)
Total Bilirubin: 1.5 mg/dL — ABNORMAL HIGH (ref 0.3–1.2)
Total Protein: 7.6 g/dL (ref 6.5–8.1)

## 2022-07-27 LAB — URINALYSIS, ROUTINE W REFLEX MICROSCOPIC
Bilirubin Urine: NEGATIVE
Glucose, UA: NEGATIVE mg/dL
Ketones, ur: NEGATIVE mg/dL
Nitrite: NEGATIVE
Protein, ur: 30 mg/dL — AB
Specific Gravity, Urine: 1.015 (ref 1.005–1.030)
WBC, UA: >50 WBC/hpf (ref 0–5)
pH: 6 (ref 5.0–8.0)

## 2022-07-27 LAB — RESP PANEL BY RT-PCR (RSV, FLU A&B, COVID)  RVPGX2
Influenza A by PCR: NEGATIVE
Influenza B by PCR: NEGATIVE
Resp Syncytial Virus by PCR: NEGATIVE
SARS Coronavirus 2 by RT PCR: NEGATIVE

## 2022-07-27 LAB — LIPASE, BLOOD: Lipase: 32 U/L (ref 11–51)

## 2022-07-27 MED ORDER — ACETAMINOPHEN 500 MG PO TABS
1000.0000 mg | ORAL_TABLET | Freq: Once | ORAL | Status: AC
  Administered 2022-07-27: 1000 mg via ORAL
  Filled 2022-07-27: qty 2

## 2022-07-27 MED ORDER — CEPHALEXIN 250 MG PO CAPS
250.0000 mg | ORAL_CAPSULE | Freq: Four times a day (QID) | ORAL | 0 refills | Status: DC
  Filled 2022-07-27: qty 28, 7d supply, fill #0
  Filled 2022-07-28: qty 13, 3d supply, fill #0
  Filled 2022-07-28 (×2): qty 13, 4d supply, fill #1
  Filled 2022-07-28: qty 15, 4d supply, fill #0

## 2022-07-27 NOTE — Discharge Instructions (Signed)
Please take your medications as prescribed. Take tylenol/ibuprofen for pain. I recommend close follow-up with PCP for reevaluation.  Please do not hesitate to return to emergency department if worrisome signs symptoms we discussed become apparent.

## 2022-07-27 NOTE — ED Triage Notes (Signed)
Patient arrives ambulatory by POV c/o painful urination, dark urine, lower abdominal and lower back pain since Friday. Patient reports flu like symptoms- achy feeling. Tool some theraflu.
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
## 2022-07-27 NOTE — ED Notes (Signed)
Patient transported to CT 

## 2022-07-28 ENCOUNTER — Other Ambulatory Visit: Payer: Self-pay

## 2022-07-29 ENCOUNTER — Other Ambulatory Visit: Payer: Self-pay

## 2022-07-30 ENCOUNTER — Other Ambulatory Visit: Payer: Self-pay

## 2022-07-30 ENCOUNTER — Ambulatory Visit: Payer: Self-pay | Attending: Internal Medicine | Admitting: Physician Assistant

## 2022-07-30 ENCOUNTER — Encounter: Payer: Self-pay | Admitting: Physician Assistant

## 2022-07-30 ENCOUNTER — Other Ambulatory Visit (HOSPITAL_COMMUNITY)
Admission: RE | Admit: 2022-07-30 | Discharge: 2022-07-30 | Disposition: A | Discharge status: Home / Self Care | Payer: Self-pay | Source: Ambulatory Visit | Attending: Internal Medicine | Admitting: Internal Medicine

## 2022-07-30 VITALS — BP 108/73 | HR 92 | Temp 98.4°F | Ht 71.0 in | Wt 313.0 lb

## 2022-07-30 DIAGNOSIS — N200 Calculus of kidney: Secondary | ICD-10-CM

## 2022-07-30 DIAGNOSIS — N3001 Acute cystitis with hematuria: Secondary | ICD-10-CM | POA: Insufficient documentation

## 2022-07-30 DIAGNOSIS — Z09 Encounter for follow-up examination after completed treatment for conditions other than malignant neoplasm: Secondary | ICD-10-CM
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
## 2022-07-30 NOTE — Patient Instructions (Signed)
Drink 80 to 100 ounces water daily  Urinary Tract Infection, Adult A urinary tract infection (UTI) is an infection of any part of the urinary tract. The urinary tract includes: The kidneys. The ureters. The bladder. The urethra. These organs make, store, and get rid of pee (urine) in the body. What are the causes? This infection is caused by germs (bacteria) in your genital area. These germs grow and cause swelling (inflammation) of your urinary tract. What increases the risk? The following factors may make you more likely to develop this condition: Using a small, thin tube (catheter) to drain pee. Not being able to control when you pee or poop (incontinence). Being male. If you are male, these things can increase the risk: Using these methods to prevent pregnancy: A medicine that kills sperm (spermicide). A device that blocks sperm (diaphragm). Having low levels of a male hormone (estrogen). Being pregnant. You are more likely to develop this condition if: You have genes that add to your risk. You are sexually active. You take antibiotic medicines. You have trouble peeing because of: A prostate that is bigger than normal, if you are male. A blockage in the part of your body that drains pee from the bladder. A kidney stone. A nerve condition that affects your bladder. Not getting enough to drink. Not peeing often enough. You have other conditions, such as: Diabetes. A weak disease-fighting system (immune system). Sickle cell disease. Gout. Injury of the spine. What are the signs or symptoms? Symptoms of this condition include: Needing to pee right away. Peeing small amounts often. Pain or burning when peeing. Blood in the pee. Pee that smells bad or not like normal. Trouble peeing. Pee that is cloudy. Fluid coming from the vagina, if you are male. Pain in the belly or lower back. Other symptoms include: Vomiting. Not feeling hungry. Feeling mixed up  (confused). This may be the first symptom in older adults. Being tired and grouchy (irritable). A fever. Watery poop (diarrhea). How is this treated? Taking antibiotic medicine. Taking other medicines. Drinking enough water. In some cases, you may need to see a specialist. Follow these instructions at home:  Medicines Take over-the-counter and prescription medicines only as told by your doctor. If you were prescribed an antibiotic medicine, take it as told by your doctor. Do not stop taking it even if you start to feel better. General instructions Make sure you: Pee until your bladder is empty. Do not hold pee for a long time. Empty your bladder after sex. Wipe from front to back after peeing or pooping if you are a male. Use each tissue one time when you wipe. Drink enough fluid to keep your pee pale yellow. Keep all follow-up visits. Contact a doctor if: You do not get better after 1-2 days. Your symptoms go away and then come back. Get help right away if: You have very bad back pain. You have very bad pain in your lower belly. You have a fever. You have chills. You feeling like you will vomit or you vomit. Summary A urinary tract infection (UTI) is an infection of any part of the urinary tract. This condition is caused by germs in your genital area. There are many risk factors for a UTI. Treatment includes antibiotic medicines. Drink enough fluid to keep your pee pale yellow. This information is not intended to replace advice given to you by your health care provider. Make sure you discuss any questions you have with your health care provider. Document Revised:  01/06/2020 Document Reviewed: 01/06/2020 Elsevier Patient Education  [employer].

## 2022-07-31 LAB — URINE CYTOLOGY ANCILLARY ONLY
Chlamydia: NEGATIVE
Comment: NEGATIVE
Comment: NEGATIVE
Comment: NORMAL
Neisseria Gonorrhea: NEGATIVE
Trichomonas: NEGATIVE

## 2022-08-01 LAB — URINE CULTURE: Organism ID, Bacteria: NO GROWTH

## 2022-08-08 IMAGING — DX DG CHEST 1V PORT
1 series · 1 of 1 positions shown · non-contrast
Comparison: Portable chest 7858 hours today.

CLINICAL DATA: 44-year-old male central line placement. Found down.

EXAM:
PORTABLE CHEST 1 VIEW

[chest ap]
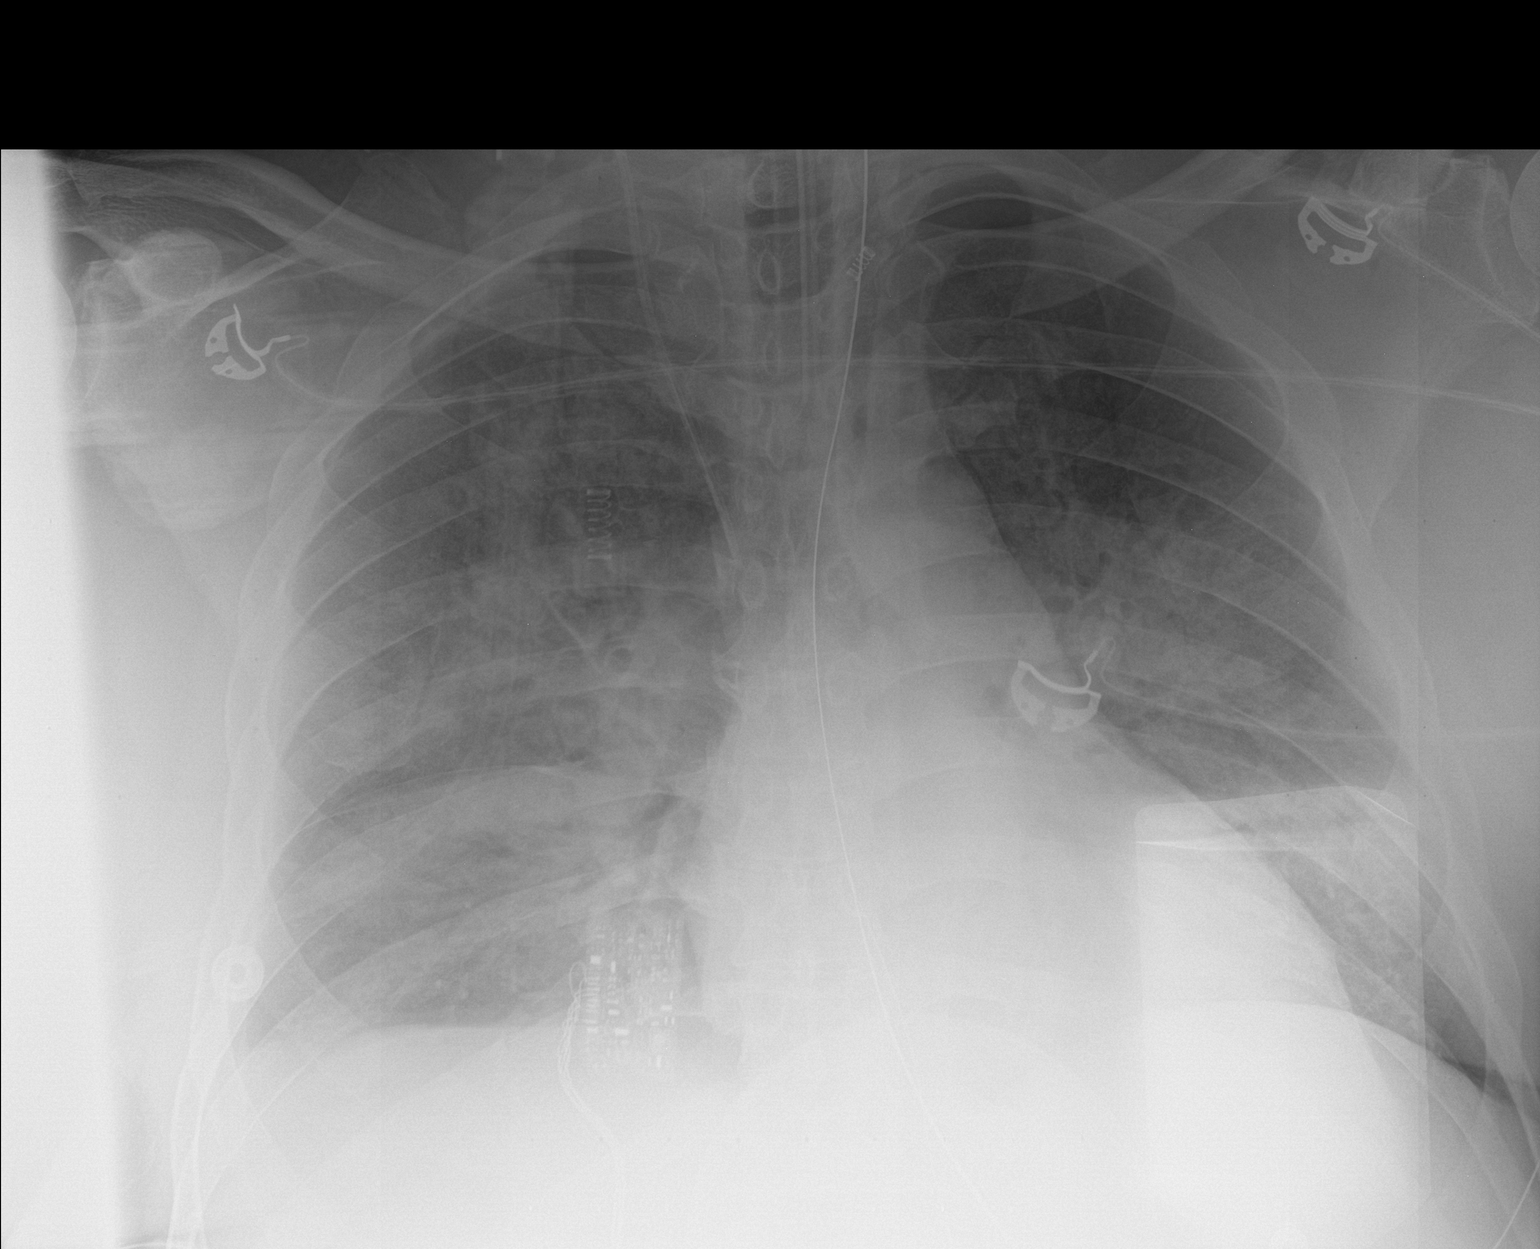

[1 of 1 positions shown; findings below may reference images not displayed]

FINDINGS: Portable AP semi upright view at 6451 hours. Endotracheal tube tip
in good position just below the clavicles now. Enteric tube has been
placed and courses to the left abdomen, tip not included. New right
IJ approach central line, tip is at the SVC level of the carina.
Normal cardiac size and mediastinal contours.

No pneumothorax. Larger lung volumes with mildly improved
ventilation. Residual perihilar, indistinct opacity. No pleural
effusion is evident.
IMPRESSION: 1. Right IJ approach central line tip at the SVC level. No
pneumothorax.
2. Endotracheal tube tip in good position just below the clavicles
now. Enteric tube courses to the left abdomen, tip not included.
3. Larger lung volumes with mildly improved ventilation. Residual
perihilar opacity might be pulmonary edema.

## 2022-08-08 IMAGING — DX DG CHEST 1V PORT
1 series · 1 of 1 positions shown · non-contrast
Comparison: 01/19/2020

CLINICAL DATA: Post intubation

EXAM:
PORTABLE CHEST 1 VIEW

[chest]
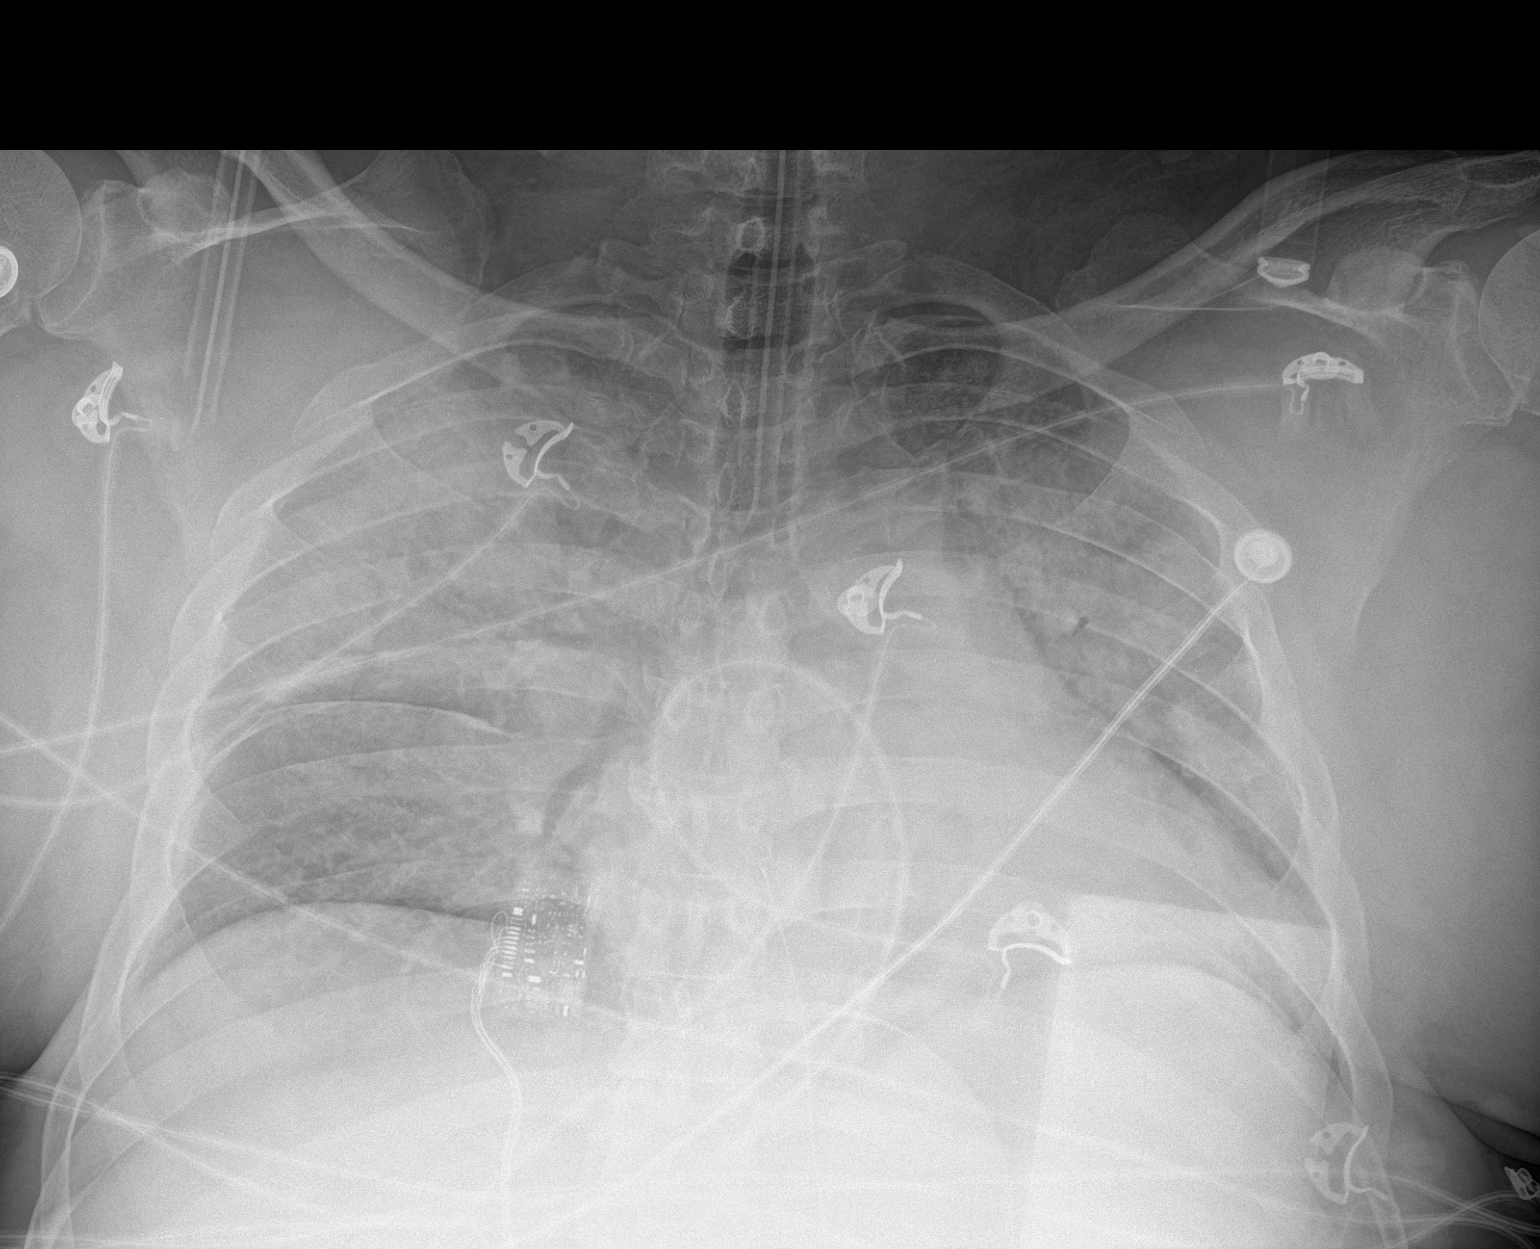

[1 of 1 positions shown; findings below may reference images not displayed]

FINDINGS: An endotracheal tube has been placed with tip measuring 2.1 cm above
the carina. Shallow inspiration. Cardiac enlargement. Areas of
consolidation and airspace disease in the upper lungs bilaterally
and right middle lung. Mild progression since previous study. No
pleural effusions. No pneumothorax. Mediastinal contours appear
intact.
IMPRESSION: Endotracheal tube tip measures 2.1 cm above the carina. Cardiac
enlargement. Areas of consolidation and airspace disease in the
upper lungs and right middle lung with mild progression since
previous study.

## 2022-08-15 ENCOUNTER — Emergency Department (HOSPITAL_BASED_OUTPATIENT_CLINIC_OR_DEPARTMENT_OTHER)
Admission: EM | Admit: 2022-08-15 | Discharge: 2022-08-15 | Disposition: A | Discharge status: Home / Self Care | Payer: Self-pay | Attending: Emergency Medicine | Admitting: Emergency Medicine

## 2022-08-15 ENCOUNTER — Other Ambulatory Visit: Payer: Self-pay

## 2022-08-15 DIAGNOSIS — R197 Diarrhea, unspecified: Secondary | ICD-10-CM | POA: Insufficient documentation

## 2022-08-15 LAB — URINALYSIS, ROUTINE W REFLEX MICROSCOPIC
Bilirubin Urine: NEGATIVE
Glucose, UA: NEGATIVE mg/dL
Ketones, ur: NEGATIVE mg/dL
Leukocytes,Ua: NEGATIVE
Nitrite: NEGATIVE
Protein, ur: NEGATIVE mg/dL
Specific Gravity, Urine: >=1.03 (ref 1.005–1.030)
pH: 5.5 (ref 5.0–8.0)

## 2022-08-15 LAB — COMPREHENSIVE METABOLIC PANEL
ALT: 29 U/L (ref 0–44)
AST: 33 U/L (ref 15–41)
Albumin: 4.1 g/dL (ref 3.5–5.0)
Alkaline Phosphatase: 46 U/L (ref 38–126)
Anion gap: 4 — ABNORMAL LOW (ref 5–15)
BUN: 16 mg/dL (ref 6–20)
CO2: 23 mmol/L (ref 22–32)
Calcium: 8.7 mg/dL — ABNORMAL LOW (ref 8.9–10.3)
Chloride: 107 mmol/L (ref 98–111)
Creatinine, Ser: 0.9 mg/dL (ref 0.61–1.24)
GFR, Estimated: >60 mL/min (ref >60)
Glucose, Bld: 86 mg/dL (ref 70–99)
Potassium: 3.9 mmol/L (ref 3.5–5.1)
Sodium: 134 mmol/L — ABNORMAL LOW (ref 135–145)
Total Bilirubin: 1 mg/dL (ref 0.3–1.2)
Total Protein: 7.9 g/dL (ref 6.5–8.1)

## 2022-08-15 LAB — URINALYSIS, MICROSCOPIC (REFLEX): WBC, UA: NONE SEEN WBC/hpf (ref 0–5)

## 2022-08-15 LAB — LIPASE, BLOOD: Lipase: 28 U/L (ref 11–51)

## 2022-08-15 MED ORDER — LACTATED RINGERS IV BOLUS
1000.0000 mL | Freq: Once | INTRAVENOUS | Status: AC
  Administered 2022-08-15: 1000 mL via INTRAVENOUS

## 2022-08-15 MED ORDER — ONDANSETRON HCL 4 MG/2ML IJ SOLN
4.0000 mg | Freq: Once | INTRAMUSCULAR | Status: AC
  Administered 2022-08-15: 4 mg via INTRAVENOUS
  Filled 2022-08-15: qty 2

## 2022-08-15 NOTE — ED Triage Notes (Signed)
Pt arrives with c/o diarrhea and lower ABD pain that started yesterday. Pt denies n/v or fevers.

## 2022-08-15 NOTE — ED Provider Notes (Signed)
[facility] Provider Note   CSN: [id number] Arrival date & time: 08/15/22  1729     History Chief Complaint  Patient presents with   Diarrhea    HPI Wayne [name] is a 47 y.o. male presenting for diarrhea.  Endorses 1 day of 10 episodes of diarrhea now improving.  Denies fevers chills nausea vomiting syncope shortness of breath.  Endorses frequent belching.  Otherwise ambulatory tolerating p.o. intake.  States that he ate a burger that had an abnormal smell to it yesterday.  Patient's recorded medical, surgical, social, medication list and allergies were reviewed in the Snapshot window as part of the initial history.   Review of Systems   Review of Systems  Constitutional:  Negative for chills and fever.  HENT:  Negative for ear pain and sore throat.   Eyes:  Negative for pain and visual disturbance.  Respiratory:  Negative for cough and shortness of breath.   Cardiovascular:  Negative for chest pain and palpitations.  Gastrointestinal:  Positive for diarrhea. Negative for abdominal pain and vomiting.  Genitourinary:  Negative for dysuria and hematuria.  Musculoskeletal:  Negative for arthralgias and back pain.  Skin:  Negative for color change and rash.  Neurological:  Negative for seizures and syncope.  All other systems reviewed and are negative.   Physical Exam Updated Vital Signs BP 107/74 (BP Location: Left Arm)   Pulse 73   Temp 98.4 F (36.9 C) (Oral)   Resp 18   Ht '5\' 11"'$  (1.803 m)   Wt (!) 142 kg   SpO2 98%   BMI 43.65 kg/m  Physical Exam Vitals and nursing note reviewed.  Constitutional:      General: He is not in acute distress.    Appearance: He is well-developed.  HENT:     Head: Normocephalic and atraumatic.  Eyes:     Conjunctiva/sclera: Conjunctivae normal.  Cardiovascular:     Rate and Rhythm: Normal rate and regular rhythm.     Heart sounds: No murmur heard. Pulmonary:     Effort: Pulmonary  effort is normal. No respiratory distress.     Breath sounds: Normal breath sounds.  Abdominal:     Palpations: Abdomen is soft.     Tenderness: There is no abdominal tenderness.  Musculoskeletal:        General: No swelling.     Cervical back: Neck supple.  Skin:    General: Skin is warm and dry.     Capillary Refill: Capillary refill takes less than 2 seconds.  Neurological:     Mental Status: He is alert.  Psychiatric:        Mood and Affect: Mood normal.      ED Course/ Medical Decision Making/ A&P    Procedures Procedures   Medications Ordered in ED Medications  ondansetron (ZOFRAN) injection 4 mg (4 mg Intravenous Given 08/15/22 2105)  lactated ringers bolus 1,000 mL (1,000 mLs Intravenous New Bag/Given 08/15/22 2056)    Medical Decision Making:    Wayne [name] is a 47 y.o. male who presented to the ED today with chief complaint of diarrheal illness detailed above.    Complete initial physical exam performed, notably the patient  was hemodynamically stable in no acute distress.      Reviewed and confirmed nursing documentation for past medical history, family history, social history.    Initial Assessment:   With the patient's presentation of diarrheal illness, most likely diagnosis is viral versus foodborne illness.  Other diagnoses were considered including (but not limited to) appendicitis cholecystitis, small bowel obstruction. These are considered less likely due to history of present illness and physical exam findings.   This is most consistent with an acute life/limb threatening illness complicated by underlying chronic conditions.  Initial Plan:  Screening labs including CBC and Metabolic panel to evaluate for infectious or metabolic etiology of disease.  Objective evaluation as below reviewed with plan for close reassessment  Initial Study Results:   Laboratory  All laboratory results reviewed without evidence of clinically relevant pathology.    Final  Assessment and Plan:   Unfortunately patient's CBC clotted in the lab sample and he does not want to be stuck for a second sample.  Given reassuring CMP, resolution of symptoms on second reassessment, patient stable for outpatient care management likely nonspecific viral etiology versus foodborne illness that appears to be improving.  Considered more sinister etiology, however given overall well appearance, this seems grossly less likely.  Recommended close follow-up with PCP, supportive care returning if symptoms worsening.  Patient expressed understanding discharged with no further acute events.    Disposition:  I have considered need for hospitalization, however, considering all of the above, I believe this patient is stable for discharge at this time.  Patient/family educated about specific return precautions for given chief complaint and symptoms.  Patient/family educated about follow-up with PCP.     Patient/family expressed understanding of return precautions and need for follow-up. Patient spoken to regarding all imaging and laboratory results and appropriate follow up for these results. All education provided in verbal form with additional information in written form. Time was allowed for answering of patient questions. Patient discharged.    Emergency Department Medication Summary:   Medications  ondansetron [facility]) injection 4 mg (4 mg Intravenous Given 08/15/22 2105)  lactated ringers bolus 1,000 mL (1,000 mLs Intravenous New Bag/Given 08/15/22 2056)     Clinical Impression:  1. Diarrhea of presumed infectious origin      Data Unavailable   Final Clinical Impression(s) / ED Diagnoses Final diagnoses:  Diarrhea of presumed infectious origin    Rx / DC Orders ED Discharge Orders     None         Tretha Sciara, MD 08/15/22 2226

## 2022-08-18 ENCOUNTER — Other Ambulatory Visit: Payer: Self-pay

## 2022-08-18 ENCOUNTER — Ambulatory Visit: Payer: Self-pay | Attending: Internal Medicine | Admitting: Internal Medicine

## 2022-08-18 ENCOUNTER — Other Ambulatory Visit: Payer: Self-pay | Admitting: Internal Medicine

## 2022-08-18 ENCOUNTER — Encounter: Payer: Self-pay | Admitting: Internal Medicine

## 2022-08-18 VITALS — BP 110/76 | HR 83 | Temp 98.5°F | Ht 71.0 in | Wt 308.0 lb

## 2022-08-18 DIAGNOSIS — D3502 Benign neoplasm of left adrenal gland: Secondary | ICD-10-CM

## 2022-08-18 DIAGNOSIS — I10 Essential (primary) hypertension: Secondary | ICD-10-CM

## 2022-08-18 DIAGNOSIS — Z8739 Personal history of other diseases of the musculoskeletal system and connective tissue: Secondary | ICD-10-CM

## 2022-08-18 DIAGNOSIS — M7502 Adhesive capsulitis of left shoulder: Secondary | ICD-10-CM

## 2022-08-18 DIAGNOSIS — G4733 Obstructive sleep apnea (adult) (pediatric): Secondary | ICD-10-CM

## 2022-08-18 DIAGNOSIS — D649 Anemia, unspecified: Secondary | ICD-10-CM

## 2022-08-18 DIAGNOSIS — R911 Solitary pulmonary nodule: Secondary | ICD-10-CM

## 2022-08-18 DIAGNOSIS — R197 Diarrhea, unspecified: Secondary | ICD-10-CM

## 2022-08-18 MED ORDER — COLCHICINE 0.6 MG PO TABS
0.6000 mg | ORAL_TABLET | Freq: Every day | ORAL | 1 refills | Status: DC
  Filled 2022-08-18: qty 90, 90d supply, fill #0
  Filled 2022-10-07 – 2022-11-12 (×3): qty 90, 90d supply, fill #1

## 2022-08-18 NOTE — Patient Instructions (Signed)
Purchase Imodium over-the-counter and take as needed for diarrhea.  Stay hydrated by drinking several glasses of water and or Gatorade.
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
## 2022-08-19 ENCOUNTER — Other Ambulatory Visit: Payer: Self-pay

## 2022-08-19 LAB — BASIC METABOLIC PANEL
BUN/Creatinine Ratio: 10 (ref 9–20)
BUN: 10 mg/dL (ref 6–24)
CO2: 21 mmol/L (ref 20–29)
Calcium: 9 mg/dL (ref 8.7–10.2)
Chloride: 107 mmol/L — ABNORMAL HIGH (ref 96–106)
Creatinine, Ser: 0.99 mg/dL (ref 0.76–1.27)
Glucose: 90 mg/dL (ref 70–99)
Potassium: 3.6 mmol/L (ref 3.5–5.2)
Sodium: 141 mmol/L (ref 134–144)
eGFR: 94 mL/min/{1.73_m2} (ref >59)

## 2022-08-19 LAB — CBC
Hematocrit: 38.9 % (ref 37.5–51.0)
Hemoglobin: 12.9 g/dL — ABNORMAL LOW (ref 13.0–17.7)
MCH: 29.8 pg (ref 26.6–33.0)
MCHC: 33.2 g/dL (ref 31.5–35.7)
MCV: 90 fL (ref 79–97)
Platelets: 140 10*3/uL — ABNORMAL LOW (ref 150–450)
RBC: 4.33 x10E6/uL (ref 4.14–5.80)
RDW: 11.9 % (ref 11.6–15.4)
WBC: 2.1 10*3/uL — CL (ref 3.4–10.8)

## 2022-08-19 NOTE — Addendum Note (Signed)
Addended by: Karle Plumber B on: 08/19/2022 08:51 AM   Modules accepted: Orders

## 2022-08-20 ENCOUNTER — Other Ambulatory Visit: Payer: Self-pay | Admitting: Internal Medicine

## 2022-08-20 DIAGNOSIS — D61818 Other pancytopenia: Secondary | ICD-10-CM

## 2022-08-22 ENCOUNTER — Other Ambulatory Visit: Payer: Self-pay | Admitting: Family

## 2022-08-22 DIAGNOSIS — D649 Anemia, unspecified: Secondary | ICD-10-CM

## 2022-08-22 LAB — CLOSTRIDIUM DIFFICILE BY PCR: Toxigenic C. Difficile by PCR: NEGATIVE

## 2022-08-25 ENCOUNTER — Encounter: Payer: Self-pay | Admitting: Family

## 2022-08-25 ENCOUNTER — Encounter: Payer: Self-pay | Admitting: Surgical

## 2022-08-25 ENCOUNTER — Inpatient Hospital Stay (HOSPITAL_BASED_OUTPATIENT_CLINIC_OR_DEPARTMENT_OTHER): Payer: Self-pay | Admitting: Family

## 2022-08-25 ENCOUNTER — Inpatient Hospital Stay: Payer: Self-pay | Attending: Hematology & Oncology

## 2022-08-25 ENCOUNTER — Ambulatory Visit: Payer: Self-pay | Admitting: Surgical

## 2022-08-25 ENCOUNTER — Other Ambulatory Visit: Payer: Self-pay

## 2022-08-25 VITALS — BP 113/74 | HR 68 | Temp 98.6°F | Resp 18 | Ht 71.0 in | Wt 318.8 lb

## 2022-08-25 DIAGNOSIS — I10 Essential (primary) hypertension: Secondary | ICD-10-CM | POA: Insufficient documentation

## 2022-08-25 DIAGNOSIS — M1612 Unilateral primary osteoarthritis, left hip: Secondary | ICD-10-CM

## 2022-08-25 DIAGNOSIS — Z79899 Other long term (current) drug therapy: Secondary | ICD-10-CM | POA: Insufficient documentation

## 2022-08-25 DIAGNOSIS — D649 Anemia, unspecified: Secondary | ICD-10-CM

## 2022-08-25 DIAGNOSIS — M7522 Bicipital tendinitis, left shoulder: Secondary | ICD-10-CM

## 2022-08-25 DIAGNOSIS — Z87440 Personal history of urinary (tract) infections: Secondary | ICD-10-CM | POA: Insufficient documentation

## 2022-08-25 DIAGNOSIS — D61818 Other pancytopenia: Secondary | ICD-10-CM

## 2022-08-25 DIAGNOSIS — Z803 Family history of malignant neoplasm of breast: Secondary | ICD-10-CM | POA: Insufficient documentation

## 2022-08-25 LAB — CBC WITH DIFFERENTIAL (CANCER CENTER ONLY)
Abs Immature Granulocytes: 0 10*3/uL (ref 0.00–0.07)
Basophils Absolute: 0 10*3/uL (ref 0.0–0.1)
Basophils Relative: 1 %
Eosinophils Absolute: 0.4 10*3/uL (ref 0.0–0.5)
Eosinophils Relative: 10 %
HCT: 38.2 % — ABNORMAL LOW (ref 39.0–52.0)
Hemoglobin: 12.6 g/dL — ABNORMAL LOW (ref 13.0–17.0)
Immature Granulocytes: 0 %
Lymphocytes Relative: 48 %
Lymphs Abs: 2.1 10*3/uL (ref 0.7–4.0)
MCH: 30.4 pg (ref 26.0–34.0)
MCHC: 33 g/dL (ref 30.0–36.0)
MCV: 92.3 fL (ref 80.0–100.0)
Monocytes Absolute: 0.4 10*3/uL (ref 0.1–1.0)
Monocytes Relative: 9 %
Neutro Abs: 1.4 10*3/uL — ABNORMAL LOW (ref 1.7–7.7)
Neutrophils Relative %: 32 %
Platelet Count: 164 10*3/uL (ref 150–400)
RBC: 4.14 MIL/uL — ABNORMAL LOW (ref 4.22–5.81)
RDW: 12.3 % (ref 11.5–15.5)
WBC Count: 4.3 10*3/uL (ref 4.0–10.5)
nRBC: 0 % (ref 0.0–0.2)

## 2022-08-25 LAB — CMP (CANCER CENTER ONLY)
ALT: 25 U/L (ref 0–44)
AST: 21 U/L (ref 15–41)
Albumin: 4.2 g/dL (ref 3.5–5.0)
Alkaline Phosphatase: 49 U/L (ref 38–126)
Anion gap: 8 (ref 5–15)
BUN: 13 mg/dL (ref 6–20)
CO2: 30 mmol/L (ref 22–32)
Calcium: 9.3 mg/dL (ref 8.9–10.3)
Chloride: 104 mmol/L (ref 98–111)
Creatinine: 0.99 mg/dL (ref 0.61–1.24)
GFR, Estimated: >60 mL/min (ref >60)
Glucose, Bld: 98 mg/dL (ref 70–99)
Potassium: 3.8 mmol/L (ref 3.5–5.1)
Sodium: 142 mmol/L (ref 135–145)
Total Bilirubin: 0.6 mg/dL (ref 0.3–1.2)
Total Protein: 7.2 g/dL (ref 6.5–8.1)

## 2022-08-25 LAB — LACTATE DEHYDROGENASE: LDH: 107 U/L (ref 98–192)

## 2022-08-25 LAB — SAVE SMEAR(SSMR), FOR PROVIDER SLIDE REVIEW

## 2022-08-25 MED ORDER — METHYLPREDNISOLONE ACETATE 40 MG/ML IJ SUSP
40.0000 mg | INTRAMUSCULAR | Status: AC | PRN
  Administered 2022-08-25: 40 mg via INTRA_ARTICULAR

## 2022-08-25 MED ORDER — BUPIVACAINE HCL 0.5 % IJ SOLN
9.0000 mL | INTRAMUSCULAR | Status: AC | PRN
  Administered 2022-08-25: 9 mL via INTRA_ARTICULAR

## 2022-08-25 MED ORDER — LIDOCAINE HCL 1 % IJ SOLN
5.0000 mL | INTRAMUSCULAR | Status: AC | PRN
  Administered 2022-08-25: 5 mL

## 2022-08-25 MED ORDER — BUPIVACAINE HCL 0.25 % IJ SOLN
4.0000 mL | INTRAMUSCULAR | Status: AC | PRN
  Administered 2022-08-25: 4 mL via INTRA_ARTICULAR

## 2022-08-25 NOTE — Progress Notes (Signed)
   Procedure Note  Patient: Wayne [name]             Date of Birth: 11-20-1975           MRN: [id number]             Visit Date: 08/25/2022  Procedures: Visit Diagnoses:  1. Arthritis of left hip     Large Joint Inj: L glenohumeral on 08/25/2022 6:45 PM Indications: diagnostic evaluation and pain Details: 22 G 1.5 in and 3.5 in needle, ultrasound-guided posterior approach  Arthrogram: No  Medications: 9 mL bupivacaine 0.5 %; 40 mg methylPREDNISolone acetate 40 MG/ML; 5 mL lidocaine 1 % Outcome: tolerated well, no immediate complications Procedure, treatment alternatives, risks and benefits explained, specific risks discussed. Consent was given by the patient. Immediately prior to procedure a time out was called to verify the correct patient, procedure, equipment, support staff and site/side marked as required. Patient was prepped and draped in the usual sterile fashion.    Large Joint Inj: L hip joint on 08/25/2022 6:45 PM Indications: pain and diagnostic evaluation Details: 22 G 3.5 in needle, ultrasound-guided anterior approach  Arthrogram: No  Medications: 5 mL lidocaine 1 %; 4 mL bupivacaine 0.25 %; 40 mg methylPREDNISolone acetate 40 MG/ML Outcome: tolerated well, no immediate complications  Patient returns for repeat hip and shoulder injections.  These have provided good relief for him in the past that lasted for about 2 months or 2-1/2 months.  In the case of the shoulder.  He does not have any significant rotator cuff weakness on exam today and seems most of his pain is anteriorly and elicited with stressing the bicep tendon.  Plan is to try repeat injections today and if he has limited relief, could consider MRI of the left hip or consideration of surgical intervention in the left shoulder.  He has some degenerative changes of the left hip on x-ray but this does not appear to be severe.  He did have good relief after reevaluation after left hip joint injection about 5 to 10  minutes later. Procedure, treatment alternatives, risks and benefits explained, specific risks discussed. Consent was given by the patient. Immediately prior to procedure a time out was called to verify the correct patient, procedure, equipment, support staff and site/side marked as required. Patient was prepped and draped in the usual sterile fashion.
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
## 2022-09-10 LAB — FERRITIN: Ferritin: 272 ng/mL (ref 30–400)

## 2022-09-10 LAB — IRON AND TIBC
Iron Saturation: 13 % — ABNORMAL LOW (ref 15–55)
Iron: 41 ug/dL (ref 38–169)
Total Iron Binding Capacity: 308 ug/dL (ref 250–450)
UIBC: 267 ug/dL (ref 111–343)

## 2022-09-10 LAB — SPECIMEN STATUS REPORT

## 2022-09-11 ENCOUNTER — Other Ambulatory Visit (HOSPITAL_COMMUNITY): Payer: Self-pay

## 2022-09-15 ENCOUNTER — Other Ambulatory Visit: Payer: Self-pay | Admitting: Internal Medicine

## 2022-09-15 ENCOUNTER — Other Ambulatory Visit: Payer: Self-pay

## 2022-09-15 DIAGNOSIS — I10 Essential (primary) hypertension: Secondary | ICD-10-CM

## 2022-09-16 ENCOUNTER — Other Ambulatory Visit: Payer: Self-pay

## 2022-09-16 MED ORDER — LOSARTAN POTASSIUM 100 MG PO TABS
100.0000 mg | ORAL_TABLET | Freq: Every day | ORAL | 1 refills | Status: DC
  Filled 2022-09-16: qty 90, 90d supply, fill #0
  Filled 2022-10-07 – 2022-12-15 (×3): qty 90, 90d supply, fill #1

## 2022-09-16 MED ORDER — CARVEDILOL 6.25 MG PO TABS
6.2500 mg | ORAL_TABLET | Freq: Two times a day (BID) | ORAL | 1 refills | Status: DC
  Filled 2022-09-16: qty 180, 90d supply, fill #0
  Filled 2022-10-07 – 2022-12-15 (×3): qty 180, 90d supply, fill #1

## 2022-09-16 NOTE — Telephone Encounter (Signed)
Requested Prescriptions  Pending Prescriptions Disp Refills   carvedilol (COREG) 6.25 MG tablet 180 tablet 2    Sig: Take 1 tablet (6.25 mg total) by mouth 2 (two) times daily.     Cardiovascular: Beta Blockers 3 Passed - 09/15/2022 10:24 AM      Passed - Cr in normal range and within 360 days    Creatinine  Date Value Ref Range Status  08/25/2022 0.99 0.61 - 1.24 mg/dL Final         Passed - AST in normal range and within 360 days    AST  Date Value Ref Range Status  08/25/2022 21 15 - 41 U/L Final         Passed - ALT in normal range and within 360 days    ALT  Date Value Ref Range Status  08/25/2022 25 0 - 44 U/L Final         Passed - Last BP in normal range    BP Readings from Last 1 Encounters:  08/25/22 113/74         Passed - Last Heart Rate in normal range    Pulse Readings from Last 1 Encounters:  08/25/22 68         Passed - Valid encounter within last 6 months    Recent Outpatient Visits           4 weeks ago Acute diarrhea   [facility] [name] B, MD   1 month ago Acute cystitis with hematuria   [facility] [place], [place], [place]   5 months ago Essential hypertension   [place] [facility] Marcine Matar, MD   9 months ago Essential hypertension    [facility] [name] B, MD   1 year ago Essential hypertension   [place] [facility] Marcine Matar, MD       Future Appointments             In 3 months Marcine Matar, MD [facility]             losartan (COZAAR) 100 MG tablet 90 tablet 2    Sig: TAKE 1 TABLET BY MOUTH ONCE A DAY     Cardiovascular:  Angiotensin Receptor Blockers Passed - 09/15/2022 10:24 AM      Passed - Cr in normal range and within 180 days    Creatinine  Date Value Ref Range Status  08/25/2022 0.99 0.61  - 1.24 mg/dL Final         Passed - K in normal range and within 180 days    Potassium  Date Value Ref Range Status  08/25/2022 3.8 3.5 - 5.1 mmol/L Final         Passed - Patient is not pregnant      Passed - Last BP in normal range    BP Readings from Last 1 Encounters:  08/25/22 113/74         Passed - Valid encounter within last 6 months    Recent Outpatient Visits           4 weeks ago Acute diarrhea   [facility] Marcine Matar, MD   1 month ago Acute cystitis with hematuria   [facility] [place], [place], [place]  5 months ago Essential hypertension   [place] [facility] Marcine Matar, MD   9 months ago Essential hypertension   [place] [facility] Marcine Matar, MD   1 year ago Essential hypertension   [facility] Marcine Matar, MD       Future Appointments             In 3 months Laural Benes, Binnie Rail, MD [facility]

## 2022-09-16 NOTE — Telephone Encounter (Signed)
Requested medications are due for refill today.  yes  Requested medications are on the active medications list.  yes  Last refill. 12/12/2021 #180 2 rf  Future visit scheduled.   yes  Notes to clinic.  Rx written to expire 09/09/2022 - Rx is expired    Requested Prescriptions  Pending Prescriptions Disp Refills   carvedilol (COREG) 6.25 MG tablet 180 tablet 2    Sig: Take 1 tablet (6.25 mg total) by mouth 2 (two) times daily.     Cardiovascular: Beta Blockers 3 Passed - 09/15/2022 10:24 AM      Passed - Cr in normal range and within 360 days    Creatinine  Date Value Ref Range Status  08/25/2022 0.99 0.61 - 1.24 mg/dL Final         Passed - AST in normal range and within 360 days    AST  Date Value Ref Range Status  08/25/2022 21 15 - 41 U/L Final         Passed - ALT in normal range and within 360 days    ALT  Date Value Ref Range Status  08/25/2022 25 0 - 44 U/L Final         Passed - Last BP in normal range    BP Readings from Last 1 Encounters:  08/25/22 113/74         Passed - Last Heart Rate in normal range    Pulse Readings from Last 1 Encounters:  08/25/22 68         Passed - Valid encounter within last 6 months    Recent Outpatient Visits           4 weeks ago Acute diarrhea   [place] [facility] [name] B, MD   1 month ago Acute cystitis with hematuria   [facility] [place], [place], [place]   5 months ago Essential hypertension   [place] [facility] Marcine Matar, MD   9 months ago Essential hypertension   [place] [facility] Marcine Matar, MD   1 year ago Essential hypertension   [facility] Marcine Matar, MD       Future Appointments             In 3 months Marcine Matar, MD [facility]            Signed Prescriptions Disp  Refills   losartan (COZAAR) 100 MG tablet 90 tablet 1    Sig: TAKE 1 TABLET BY MOUTH ONCE A DAY     Cardiovascular:  Angiotensin Receptor Blockers Passed - 09/15/2022 10:24 AM      Passed - Cr in normal range and within 180 days    Creatinine  Date Value Ref Range Status  08/25/2022 0.99 0.61 - 1.24 mg/dL Final         Passed - K in normal range and within 180 days    Potassium  Date Value Ref Range Status  08/25/2022 3.8 3.5 - 5.1 mmol/L Final         Passed - Patient is not pregnant      Passed - Last BP in normal range    BP Readings from Last 1 Encounters:  08/25/22 113/74         Passed - Valid encounter within last 6 months  Recent Outpatient Visits           4 weeks ago Acute diarrhea   [place] [facility] Marcine Matar, MD   1 month ago Acute cystitis with hematuria   [facility] [place], [place], [place]   5 months ago Essential hypertension   [place] [facility] Marc[facility] Matar, MD   9 months ago Essential hypertension   [place] [facility] Marcine Matar, MD   1 year ago Essential hypertension   [facility] Marcine Matar, MD       Future Appointments             In 3 months Laural Benes, Binnie Rail, MD [facility]

## 2022-09-17 ENCOUNTER — Other Ambulatory Visit: Payer: Self-pay

## 2022-10-07 ENCOUNTER — Other Ambulatory Visit: Payer: Self-pay | Admitting: Internal Medicine

## 2022-10-07 ENCOUNTER — Other Ambulatory Visit: Payer: Self-pay

## 2022-10-07 DIAGNOSIS — I10 Essential (primary) hypertension: Secondary | ICD-10-CM

## 2022-10-07 MED ORDER — AMLODIPINE BESYLATE 10 MG PO TABS
10.0000 mg | ORAL_TABLET | Freq: Every day | ORAL | 0 refills | Status: DC
  Filled 2022-10-07 (×2): qty 90, 90d supply, fill #0

## 2022-10-07 MED ORDER — HYDROCHLOROTHIAZIDE 12.5 MG PO TABS
12.5000 mg | ORAL_TABLET | Freq: Every day | ORAL | 0 refills | Status: DC
Start: 2022-10-07 — End: 2022-12-14
  Filled 2022-10-07: qty 90, 90d supply, fill #0

## 2022-10-08 ENCOUNTER — Other Ambulatory Visit: Payer: Self-pay

## 2022-10-09 ENCOUNTER — Other Ambulatory Visit: Payer: Self-pay

## 2022-10-13 ENCOUNTER — Other Ambulatory Visit: Payer: Self-pay | Admitting: Internal Medicine

## 2022-10-13 ENCOUNTER — Other Ambulatory Visit: Payer: Self-pay

## 2022-10-13 DIAGNOSIS — M109 Gout, unspecified: Secondary | ICD-10-CM

## 2022-10-13 MED ORDER — ALLOPURINOL 300 MG PO TABS
300.0000 mg | ORAL_TABLET | Freq: Every day | ORAL | 0 refills | Status: DC
Start: 2022-10-13 — End: 2022-12-14
  Filled 2022-10-13: qty 90, 90d supply, fill #0

## 2022-10-14 ENCOUNTER — Other Ambulatory Visit: Payer: Self-pay

## 2022-11-12 ENCOUNTER — Other Ambulatory Visit: Payer: Self-pay

## 2022-11-13 ENCOUNTER — Other Ambulatory Visit: Payer: Self-pay

## 2022-12-14 ENCOUNTER — Other Ambulatory Visit: Payer: Self-pay | Admitting: Internal Medicine

## 2022-12-14 DIAGNOSIS — I10 Essential (primary) hypertension: Secondary | ICD-10-CM

## 2022-12-14 DIAGNOSIS — M109 Gout, unspecified: Secondary | ICD-10-CM

## 2022-12-14 DIAGNOSIS — Z8739 Personal history of other diseases of the musculoskeletal system and connective tissue: Secondary | ICD-10-CM

## 2022-12-15 ENCOUNTER — Other Ambulatory Visit: Payer: Self-pay

## 2022-12-15 MED ORDER — HYDROCHLOROTHIAZIDE 12.5 MG PO TABS
12.5000 mg | ORAL_TABLET | Freq: Every day | ORAL | 0 refills | Status: DC
Start: 2022-12-15 — End: 2023-04-02
  Filled 2022-12-15 – 2023-01-01 (×2): qty 90, 90d supply, fill #0

## 2022-12-15 MED ORDER — AMLODIPINE BESYLATE 10 MG PO TABS
10.0000 mg | ORAL_TABLET | Freq: Every day | ORAL | 0 refills | Status: DC
Start: 2022-12-15 — End: 2023-04-02
  Filled 2022-12-15 – 2023-01-01 (×2): qty 90, 90d supply, fill #0

## 2022-12-15 MED ORDER — ALLOPURINOL 300 MG PO TABS
300.0000 mg | ORAL_TABLET | Freq: Every day | ORAL | 0 refills | Status: DC
Start: 2022-12-15 — End: 2023-04-15
  Filled 2022-12-15 – 2023-01-01 (×2): qty 90, 90d supply, fill #0

## 2022-12-15 MED ORDER — COLCHICINE 0.6 MG PO TABS
0.6000 mg | ORAL_TABLET | Freq: Every day | ORAL | 0 refills | Status: DC
Start: 2022-12-15 — End: 2023-05-04
  Filled 2022-12-15 – 2023-01-29 (×4): qty 90, 90d supply, fill #0

## 2022-12-15 NOTE — Telephone Encounter (Signed)
Requested Prescriptions  Pending Prescriptions Disp Refills   colchicine 0.6 MG tablet 90 tablet 0    Sig: Take 1 tablet (0.6 mg total) by mouth daily.     Endocrinology:  Gout Agents - colchicine Passed - 12/14/2022  8:36 AM      Passed - Cr in normal range and within 360 days    Creatinine  Date Value Ref Range Status  08/25/2022 0.99 0.61 - 1.24 mg/dL Final         Passed - ALT in normal range and within 360 days    ALT  Date Value Ref Range Status  08/25/2022 25 0 - 44 U/L Final         Passed - AST in normal range and within 360 days    AST  Date Value Ref Range Status  08/25/2022 21 15 - 41 U/L Final         Passed - Valid encounter within last 12 months    Recent Outpatient Visits           3 months ago Acute diarrhea   [place] [facility] Wayne [name] B, [place]   4 months ago Acute cystitis with hematuria   [place] [facility] [place], Marylene Land M, [place]   8 months ago Essential hypertension   [place] [facility] Wayne Matar, MD   1 year ago Essential hypertension   [facility] Wayne Matar, MD   1 year ago Essential hypertension   [place] [facility] Wayne Matar, MD       Future Appointments             In 3 days Wayne Matar, MD [facility]            Passed - CBC within normal limits and completed in the last 12 months    WBC  Date Value Ref Range Status  07/27/2022 4.4 4.0 - 10.5 K/uL Final   WBC Count  Date Value Ref Range Status  08/25/2022 4.3 4.0 - 10.5 K/uL Final   RBC  Date Value Ref Range Status  08/25/2022 4.14 (L) 4.22 - 5.81 MIL/uL Final   Hemoglobin  Date Value Ref Range Status  08/25/2022 12.6 (L) 13.0 - 17.0 g/dL Final  09/81/1914 78.2 (L) 13.0 - 17.7 g/dL Final   HGB  Date Value Ref Range Status  10/13/2013 12.2 (L) 13.0 -  17.1 g/dL Final   Hemoglobin Other  Date Value Ref Range Status  10/13/2013 0.0 0.0 % Final    Comment:     Interpretation--------------Normal study. Reviewed by Dallas Breeding, MD, PhD, FCAP (Electronic Signature onFile)   HCT  Date Value Ref Range Status  08/25/2022 38.2 (L) 39.0 - 52.0 % Final  10/13/2013 37.4 (L) 38.7 - 49.9 % Final   Hematocrit  Date Value Ref Range Status  08/18/2022 38.9 37.5 - 51.0 % Final   MCHC  Date Value Ref Range Status  08/25/2022 33.0 30.0 - 36.0 g/dL Final   [facility]  Date Value Ref Range Status  08/25/2022 30.4 26.0 - 34.0 pg Final   MCV  Date Value Ref Range Status  08/25/2022 92.3 80.0 - 100.0 fL Final  08/18/2022 90 79 - 97 fL Final  10/13/2013 96 82 - 98 fL Final   No results found for: "PLTCOUNTKUC", "LABPLAT", "POCPLA" RDW  Date  Value Ref Range Status  08/25/2022 12.3 11.5 - 15.5 % Final  08/18/2022 11.9 11.6 - 15.4 % Final  10/13/2013 13.4 11.1 - 15.7 % Final          amLODipine (NORVASC) 10 MG tablet 90 tablet 0    Sig: Take 1 tablet (10 mg total) by mouth daily.     Cardiovascular: Calcium Channel Blockers 2 Passed - 12/14/2022  8:36 AM      Passed - Last BP in normal range    BP Readings from Last 1 Encounters:  08/25/22 113/74         Passed - Last Heart Rate in normal range    Pulse Readings from Last 1 Encounters:  08/25/22 68         Passed - Valid encounter within last 6 months    Recent Outpatient Visits           3 months ago Acute diarrhea   [place] [facility] Wayne [name] B, MD   4 months ago Acute cystitis with hematuria   [facility] [place], Marylene Land [place], [place]   8 months ago Essential hypertension   [place] [facility] Wayne Matar, MD   1 year ago Essential hypertension   [place] [facility] Wayne Matar, MD   1 year ago Essential hypertension   [place] [facility] Wayne Matar, MD       Future Appointments             In 3 days Wayne Matar, MD [place] [facility]             hydrochlorothiazide (HYDRODIURIL) 12.5 MG tablet 90 tablet 0    Sig: Take 1 tablet (12.5 mg total) by mouth daily.     Cardiovascular: Diuretics - Thiazide Passed - 12/14/2022  8:36 AM      Passed - Cr in normal range and within 180 days    Creatinine  Date Value Ref Range Status  08/25/2022 0.99 0.61 - 1.24 mg/dL Final         Passed - K in normal range and within 180 days    Potassium  Date Value Ref Range Status  08/25/2022 3.8 3.5 - 5.1 mmol/L Final         Passed - Na in normal range and within 180 days    Sodium  Date Value Ref Range Status  08/25/2022 142 135 - 145 mmol/L Final  08/18/2022 141 134 - 144 mmol/L Final         Passed - Last BP in normal range    BP Readings from Last 1 Encounters:  08/25/22 113/74         Passed - Valid encounter within last 6 months    Recent Outpatient Visits           3 months ago Acute diarrhea   [facility] Wayne Matar, MD   4 months ago Acute cystitis with hematuria   [facility] [place], [place], [place]   8 months ago Essential hypertension   [place] [facility] Wayne Matar, MD   1 year ago Essential hypertension   [facility] Wayne Matar, MD   1 year ago Essential hypertension  [facility] Wayne Matar, MD       Future Appointments             In 3 days Wayne Matar, MD [facility]             allopurinol (ZYLOPRIM) 300 MG tablet 90 tablet 0    Sig: Take 1 tablet (300 mg total) by mouth daily.     Endocrinology:  Gout Agents - allopurinol Failed - 12/14/2022  8:36 AM      Failed - Uric Acid in  normal range and within 360 days    Uric Acid  Date Value Ref Range Status  01/12/2018 5.1 3.7 - 8.6 mg/dL Final    Comment:               Therapeutic target for gout patients: <6.0         Passed - Cr in normal range and within 360 days    Creatinine  Date Value Ref Range Status  08/25/2022 0.99 0.61 - 1.24 mg/dL Final         Passed - Valid encounter within last 12 months    Recent Outpatient Visits           3 months ago Acute diarrhea   [place] [facility] Wayne Matar, MD   4 months ago Acute cystitis with hematuria   [place] [facility] [place], Marylene Land M, [place]   8 months ago Essential hypertension   [facility] Wayne Matar, MD   1 year ago Essential hypertension   [place] [facility] Wayne Matar, MD   1 year ago Essential hypertension   [facility] Wayne Matar, MD       Future Appointments             In 3 days Wayne Matar, MD [facility]            Passed - CBC within normal limits and completed in the last 12 months    WBC  Date Value Ref Range Status  07/27/2022 4.4 4.0 - 10.5 K/uL Final   WBC Count  Date Value Ref Range Status  08/25/2022 4.3 4.0 - 10.5 K/uL Final   RBC  Date Value Ref Range Status  08/25/2022 4.14 (L) 4.22 - 5.81 MIL/uL Final   Hemoglobin  Date Value Ref Range Status  08/25/2022 12.6 (L) 13.0 - 17.0 g/dL Final  16/03/9603 54.0 (L) 13.0 - 17.7 g/dL Final   HGB  Date Value Ref Range Status  10/13/2013 12.2 (L) 13.0 - 17.1 g/dL Final   Hemoglobin Other  Date Value Ref Range Status  10/13/2013 0.0 0.0 % Final    Comment:     Interpretation--------------Normal study. Reviewed by Dallas Breeding, MD, PhD, FCAP (Electronic Signature onFile)   HCT  Date Value Ref Range Status  08/25/2022 38.2 (L) 39.0 - 52.0  % Final  10/13/2013 37.4 (L) 38.7 - 49.9 % Final   Hematocrit  Date Value Ref Range Status  08/18/2022 38.9 37.5 - 51.0 % Final   MCHC  Date Value Ref Range Status  08/25/2022 33.0 30.0 - 36.0 g/dL Final   [facility]  Date Value Ref Range Status  08/25/2022 30.4 26.0 - 34.0 pg Final   MCV  Date  Value Ref Range Status  08/25/2022 92.3 80.0 - 100.0 fL Final  08/18/2022 90 79 - 97 fL Final  10/13/2013 96 82 - 98 fL Final   No results found for: "PLTCOUNTKUC", "LABPLAT", "POCPLA" RDW  Date Value Ref Range Status  08/25/2022 12.3 11.5 - 15.5 % Final  08/18/2022 11.9 11.6 - 15.4 % Final  10/13/2013 13.4 11.1 - 15.7 % Final

## 2022-12-18 ENCOUNTER — Ambulatory Visit: Payer: Self-pay | Attending: Internal Medicine | Admitting: Internal Medicine

## 2022-12-18 ENCOUNTER — Encounter: Payer: Self-pay | Admitting: Internal Medicine

## 2022-12-18 ENCOUNTER — Other Ambulatory Visit: Payer: Self-pay

## 2022-12-18 VITALS — BP 121/81 | HR 76 | Temp 98.5°F | Ht 71.0 in | Wt 329.4 lb

## 2022-12-18 DIAGNOSIS — F630 Pathological gambling: Secondary | ICD-10-CM

## 2022-12-18 DIAGNOSIS — G4489 Other headache syndrome: Secondary | ICD-10-CM

## 2022-12-18 DIAGNOSIS — I10 Essential (primary) hypertension: Secondary | ICD-10-CM

## 2022-12-18 DIAGNOSIS — M542 Cervicalgia: Secondary | ICD-10-CM

## 2022-12-18 DIAGNOSIS — G8929 Other chronic pain: Secondary | ICD-10-CM

## 2022-12-18 DIAGNOSIS — M25552 Pain in left hip: Secondary | ICD-10-CM

## 2022-12-18 MED ORDER — CYCLOBENZAPRINE HCL 5 MG PO TABS
5.0000 mg | ORAL_TABLET | Freq: Two times a day (BID) | ORAL | 0 refills | Status: DC | PRN
  Filled 2022-12-18: qty 30, 15d supply, fill #0

## 2022-12-18 MED ORDER — NABUMETONE 500 MG PO TABS
500.0000 mg | ORAL_TABLET | Freq: Every day | ORAL | 3 refills | Status: DC | PRN
Start: 2022-12-18 — End: 2023-12-17
  Filled 2022-12-18: qty 30, 30d supply, fill #0
  Filled 2023-01-01 – 2023-01-27 (×3): qty 30, 30d supply, fill #1
  Filled 2023-03-07: qty 30, 30d supply, fill #2
  Filled 2023-04-02 (×3): qty 30, 30d supply, fill #3

## 2022-12-18 NOTE — Progress Notes (Signed)
Neck and left hip pain.
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
## 2022-12-23 ENCOUNTER — Encounter: Payer: Self-pay | Admitting: Surgical

## 2022-12-23 ENCOUNTER — Ambulatory Visit (INDEPENDENT_AMBULATORY_CARE_PROVIDER_SITE_OTHER): Payer: Self-pay | Admitting: Surgical

## 2022-12-23 ENCOUNTER — Other Ambulatory Visit: Payer: Self-pay

## 2022-12-23 ENCOUNTER — Other Ambulatory Visit (INDEPENDENT_AMBULATORY_CARE_PROVIDER_SITE_OTHER): Payer: Self-pay

## 2022-12-23 DIAGNOSIS — M1612 Unilateral primary osteoarthritis, left hip: Secondary | ICD-10-CM

## 2022-12-23 MED ORDER — LIDOCAINE HCL 1 % IJ SOLN
5.0000 mL | INTRAMUSCULAR | Status: AC | PRN
Start: 2022-12-23 — End: 2022-12-23
  Administered 2022-12-23: 5 mL

## 2022-12-23 MED ORDER — METHYLPREDNISOLONE ACETATE 40 MG/ML IJ SUSP
40.0000 mg | INTRAMUSCULAR | Status: AC | PRN
Start: 2022-12-23 — End: 2022-12-23
  Administered 2022-12-23: 40 mg via INTRA_ARTICULAR

## 2022-12-23 MED ORDER — BUPIVACAINE HCL 0.25 % IJ SOLN
4.0000 mL | INTRAMUSCULAR | Status: AC | PRN
Start: 2022-12-23 — End: 2022-12-23
  Administered 2022-12-23: 4 mL via INTRA_ARTICULAR
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
## 2022-12-29 ENCOUNTER — Ambulatory Visit: Payer: Self-pay | Admitting: Internal Medicine

## 2023-01-01 ENCOUNTER — Other Ambulatory Visit: Payer: Self-pay

## 2023-01-01 ENCOUNTER — Other Ambulatory Visit: Payer: Self-pay | Admitting: Internal Medicine

## 2023-01-01 DIAGNOSIS — I10 Essential (primary) hypertension: Secondary | ICD-10-CM

## 2023-01-01 MED ORDER — CARVEDILOL 6.25 MG PO TABS
6.2500 mg | ORAL_TABLET | Freq: Two times a day (BID) | ORAL | 1 refills | Status: DC
Start: 2023-01-01 — End: 2023-07-30
  Filled 2023-01-01 – 2023-03-07 (×3): qty 180, 90d supply, fill #0
  Filled 2023-04-02 – 2023-06-17 (×2): qty 180, 90d supply, fill #1

## 2023-01-01 MED ORDER — LOSARTAN POTASSIUM 100 MG PO TABS
100.0000 mg | ORAL_TABLET | Freq: Every day | ORAL | 1 refills | Status: DC
Start: 2023-01-01 — End: 2023-07-30
  Filled 2023-01-01 – 2023-03-07 (×3): qty 90, 90d supply, fill #0
  Filled 2023-04-02 – 2023-06-17 (×2): qty 90, 90d supply, fill #1

## 2023-01-01 NOTE — Telephone Encounter (Signed)
Requested Prescriptions  Pending Prescriptions Disp Refills   carvedilol (COREG) 6.25 MG tablet 180 tablet 1    Sig: Take 1 tablet (6.25 mg total) by mouth 2 (two) times daily.     Cardiovascular: Beta Blockers 3 Passed - 01/01/2023  1:50 PM      Passed - Cr in normal range and within 360 days    Creatinine  Date Value Ref Range Status  08/25/2022 0.99 0.61 - 1.24 mg/dL Final         Passed - AST in normal range and within 360 days    AST  Date Value Ref Range Status  08/25/2022 21 15 - 41 U/L Final         Passed - ALT in normal range and within 360 days    ALT  Date Value Ref Range Status  08/25/2022 25 0 - 44 U/L Final         Passed - Last BP in normal range    BP Readings from Last 1 Encounters:  12/18/22 121/81         Passed - Last Heart Rate in normal range    Pulse Readings from Last 1 Encounters:  12/18/22 76         Passed - Valid encounter within last 6 months    Recent Outpatient Visits           2 weeks ago Essential hypertension   [facility] Marcine Matar, MD   4 months ago Acute diarrhea   [facility] [name] B, MD   5 months ago Acute cystitis with hematuria   [facility] [place], [place], [place]   8 months ago Essential hypertension   [place] Marcine Matar, MD   1 year ago Essential hypertension   [place] Marcine Matar, MD       Future Appointments             In 3 months Marcine Matar, MD [place] [facility]             losartan (COZAAR) 100 MG tablet 90 tablet 1    Sig: Take 1 tablet (100 mg total) by mouth daily.     Cardiovascular:  Angiotensin Receptor Blockers Passed - 01/01/2023  1:50 PM      Passed - Cr in normal range and within 180 days    Creatinine  Date Value Ref Range Status   08/25/2022 0.99 0.61 - 1.24 mg/dL Final         Passed - K in normal range and within 180 days    Potassium  Date Value Ref Range Status  08/25/2022 3.8 3.5 - 5.1 mmol/L Final         Passed - Patient is not pregnant      Passed - Last BP in normal range    BP Readings from Last 1 Encounters:  12/18/22 121/81         Passed - Valid encounter within last 6 months    Recent Outpatient Visits           2 weeks ago Essential hypertension   [place] [facility] Marcine Matar, MD   4 months ago Acute diarrhea   [facility] Marcine Matar, MD  5 months ago Acute cystitis with hematuria   [facility] [place], [place], [place]   8 months ago Essential hypertension   [facility] Marcine Matar, MD   1 year ago Essential hypertension   [place] [facility] Marcine Matar, MD       Future Appointments             In 3 months Laural Benes Binnie Rail, MD [facility]

## 2023-01-02 ENCOUNTER — Other Ambulatory Visit: Payer: Self-pay

## 2023-01-06 ENCOUNTER — Other Ambulatory Visit: Payer: Self-pay

## 2023-01-27 ENCOUNTER — Other Ambulatory Visit: Payer: Self-pay

## 2023-01-29 ENCOUNTER — Other Ambulatory Visit: Payer: Self-pay

## 2023-01-30 ENCOUNTER — Other Ambulatory Visit: Payer: Self-pay

## 2023-03-06 ENCOUNTER — Ambulatory Visit: Payer: Self-pay | Attending: Cardiology | Admitting: Cardiology

## 2023-03-06 ENCOUNTER — Encounter: Payer: Self-pay | Admitting: Cardiology

## 2023-03-06 VITALS — BP 116/72 | HR 78 | Ht 71.0 in | Wt 319.1 lb

## 2023-03-06 DIAGNOSIS — D649 Anemia, unspecified: Secondary | ICD-10-CM

## 2023-03-06 DIAGNOSIS — I480 Paroxysmal atrial fibrillation: Secondary | ICD-10-CM

## 2023-03-06 DIAGNOSIS — Z13220 Encounter for screening for lipoid disorders: Secondary | ICD-10-CM

## 2023-03-06 DIAGNOSIS — I10 Essential (primary) hypertension: Secondary | ICD-10-CM

## 2023-03-06 DIAGNOSIS — E782 Mixed hyperlipidemia: Secondary | ICD-10-CM | POA: Insufficient documentation

## 2023-03-06 NOTE — Patient Instructions (Signed)
Medication Instructions:  Your physician recommends that you continue on your current medications as directed. Please refer to the Current Medication list given to you today.  *If you need a refill on your cardiac medications before your next appointment, please call your pharmacy*   Lab Work: Your physician recommends that you return for lab work in: the next few days for CMP, TSH and lipids. You need to have labs done when you are fasting. [facility] is located on the 3rd floor, [address]. Hours are Monday - Friday 8 am to 4 pm, closed 11:30 am to 1:00 pm. You do NOT need an appointment.   If you have labs (blood work) drawn today and your tests are completely normal, you will receive your results only by: MyChart Message (if you have MyChart) OR A paper copy in the mail If you have any lab test that is abnormal or we need to change your treatment, we will call you to review the results.   Testing/Procedures: We will order CT coronary calcium score. It will cost $99.00 and iis due at time of scan.  Please call to schedule.    [facility] [address] [place], [place] [address] [phone] Imaging department is located on the first floor.  Follow-Up: At [facility], you and your health needs are our priority.  As part of our continuing mission to provide you with exceptional heart care, we have created designated Provider Care Teams.  These Care Teams include your primary Cardiologist (physician) and Advanced Practice Providers (APPs -  Physician Assistants and Nurse Practitioners) who all work together to provide you with the care you need, when you need it.  We recommend signing up for the patient portal called "MyChart".  Sign up information is provided on this After Visit Summary.  MyChart is used to connect with patients for Virtual Visits (Telemedicine).  Patients are able to view lab/test results, encounter notes, upcoming appointments, etc.   Non-urgent messages can be sent to your provider as well.   To learn more about what you can do with MyChart, go to [email or web address].    Your next appointment:   12 month(s)  The format for your next appointment:   In Person  Provider:   Belva Crome, MD    Other Instructions none  Important Information About Sugar
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
## 2023-03-09 ENCOUNTER — Other Ambulatory Visit: Payer: Self-pay

## 2023-03-10 ENCOUNTER — Other Ambulatory Visit (HOSPITAL_BASED_OUTPATIENT_CLINIC_OR_DEPARTMENT_OTHER): Payer: Self-pay

## 2023-03-11 ENCOUNTER — Ambulatory Visit (HOSPITAL_BASED_OUTPATIENT_CLINIC_OR_DEPARTMENT_OTHER)
Admission: RE | Admit: 2023-03-11 | Discharge: 2023-03-11 | Disposition: A | Discharge status: Home / Self Care | Payer: Self-pay | Source: Ambulatory Visit | Attending: Cardiology | Admitting: Cardiology

## 2023-03-11 DIAGNOSIS — I10 Essential (primary) hypertension: Secondary | ICD-10-CM | POA: Insufficient documentation

## 2023-04-02 ENCOUNTER — Other Ambulatory Visit: Payer: Self-pay

## 2023-04-02 ENCOUNTER — Other Ambulatory Visit: Payer: Self-pay | Admitting: Internal Medicine

## 2023-04-02 DIAGNOSIS — I10 Essential (primary) hypertension: Secondary | ICD-10-CM

## 2023-04-03 ENCOUNTER — Other Ambulatory Visit: Payer: Self-pay

## 2023-04-03 MED ORDER — AMLODIPINE BESYLATE 10 MG PO TABS
10.0000 mg | ORAL_TABLET | Freq: Every day | ORAL | 0 refills | Status: DC
Start: 2023-04-03 — End: 2023-04-20
  Filled 2023-04-03: qty 30, 30d supply, fill #0

## 2023-04-03 MED ORDER — HYDROCHLOROTHIAZIDE 12.5 MG PO TABS
12.5000 mg | ORAL_TABLET | Freq: Every day | ORAL | 0 refills | Status: DC
  Filled 2023-04-03: qty 30, 30d supply, fill #0

## 2023-04-15 ENCOUNTER — Other Ambulatory Visit: Payer: Self-pay

## 2023-04-15 ENCOUNTER — Other Ambulatory Visit: Payer: Self-pay | Admitting: Internal Medicine

## 2023-04-15 DIAGNOSIS — M109 Gout, unspecified: Secondary | ICD-10-CM

## 2023-04-15 MED ORDER — ALLOPURINOL 300 MG PO TABS
300.0000 mg | ORAL_TABLET | Freq: Every day | ORAL | 0 refills | Status: DC
Start: 2023-04-15 — End: 2023-04-20
  Filled 2023-04-15: qty 30, 30d supply, fill #0

## 2023-04-20 ENCOUNTER — Ambulatory Visit: Payer: Self-pay | Attending: Internal Medicine | Admitting: Internal Medicine

## 2023-04-20 ENCOUNTER — Other Ambulatory Visit: Payer: Self-pay

## 2023-04-20 VITALS — BP 114/77 | HR 82 | Temp 98.1°F | Ht 71.0 in | Wt 327.0 lb

## 2023-04-20 DIAGNOSIS — Z6379 Other stressful life events affecting family and household: Secondary | ICD-10-CM

## 2023-04-20 DIAGNOSIS — Z23 Encounter for immunization: Secondary | ICD-10-CM

## 2023-04-20 DIAGNOSIS — Z8739 Personal history of other diseases of the musculoskeletal system and connective tissue: Secondary | ICD-10-CM

## 2023-04-20 DIAGNOSIS — Z1211 Encounter for screening for malignant neoplasm of colon: Secondary | ICD-10-CM

## 2023-04-20 DIAGNOSIS — I10 Essential (primary) hypertension: Secondary | ICD-10-CM

## 2023-04-20 MED ORDER — HYDROCHLOROTHIAZIDE 12.5 MG PO TABS
12.5000 mg | ORAL_TABLET | Freq: Every day | ORAL | 1 refills | Status: DC
  Filled 2023-04-20 – 2023-05-04 (×2): qty 90, 90d supply, fill #0
  Filled 2023-07-27: qty 90, 90d supply, fill #1

## 2023-04-20 MED ORDER — SERTRALINE HCL 50 MG PO TABS
ORAL_TABLET | ORAL | 0 refills | Status: DC
  Filled 2023-04-20: qty 60, 74d supply, fill #0

## 2023-04-20 MED ORDER — ALLOPURINOL 300 MG PO TABS
300.0000 mg | ORAL_TABLET | Freq: Every day | ORAL | 1 refills | Status: DC
Start: 2023-04-20 — End: 2023-11-14
  Filled 2023-05-21: qty 90, 90d supply, fill #0
  Filled 2023-07-27 (×2): qty 90, 90d supply, fill #1

## 2023-04-20 MED ORDER — AMLODIPINE BESYLATE 10 MG PO TABS
10.0000 mg | ORAL_TABLET | Freq: Every day | ORAL | 1 refills | Status: DC
  Filled 2023-04-20 – 2023-05-04 (×3): qty 90, 90d supply, fill #0
  Filled 2023-07-27: qty 90, 90d supply, fill #1

## 2023-04-20 NOTE — Patient Instructions (Signed)
Try to eat at least 2 full meals a day.  Try not to overeat after meals.  Choose healthier snacks like fruits or nuts.
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
## 2023-04-21 LAB — FECAL OCCULT BLOOD, IMMUNOCHEMICAL: Fecal Occult Bld: NEGATIVE

## 2023-05-04 ENCOUNTER — Other Ambulatory Visit: Payer: Self-pay

## 2023-05-04 ENCOUNTER — Other Ambulatory Visit: Payer: Self-pay | Admitting: Internal Medicine

## 2023-05-04 DIAGNOSIS — Z8739 Personal history of other diseases of the musculoskeletal system and connective tissue: Secondary | ICD-10-CM

## 2023-05-04 MED ORDER — COLCHICINE 0.6 MG PO TABS
0.6000 mg | ORAL_TABLET | Freq: Every day | ORAL | 1 refills | Status: DC
Start: 2023-05-04 — End: 2023-10-28
  Filled 2023-05-04 – 2023-05-05 (×2): qty 90, 90d supply, fill #0
  Filled 2023-07-27: qty 30, 30d supply, fill #1
  Filled 2023-07-27: qty 90, 90d supply, fill #1
  Filled 2023-09-04: qty 30, 30d supply, fill #2
  Filled 2023-10-03 – 2023-10-05 (×2): qty 30, 30d supply, fill #3

## 2023-05-05 ENCOUNTER — Other Ambulatory Visit: Payer: Self-pay

## 2023-05-21 ENCOUNTER — Other Ambulatory Visit: Payer: Self-pay

## 2023-06-17 ENCOUNTER — Other Ambulatory Visit: Payer: Self-pay

## 2023-06-18 ENCOUNTER — Other Ambulatory Visit: Payer: Self-pay

## 2023-07-27 ENCOUNTER — Other Ambulatory Visit: Payer: Self-pay | Admitting: Internal Medicine

## 2023-07-27 ENCOUNTER — Other Ambulatory Visit: Payer: Self-pay

## 2023-07-27 DIAGNOSIS — I10 Essential (primary) hypertension: Secondary | ICD-10-CM

## 2023-07-27 NOTE — Telephone Encounter (Signed)
Per pharmacy, carvedilol (COREG) 6.25 MG tablet    And losartan (COZAAR) 100 MG tablet  patient has refill ready for pick-up . sertraline (ZOLOFT) 50 MG tablet sent to provider for approval has end dated of 07/03/2023

## 2023-07-29 ENCOUNTER — Other Ambulatory Visit: Payer: Self-pay

## 2023-07-30 ENCOUNTER — Other Ambulatory Visit: Payer: Self-pay | Admitting: Internal Medicine

## 2023-07-30 ENCOUNTER — Other Ambulatory Visit: Payer: Self-pay

## 2023-07-30 DIAGNOSIS — I10 Essential (primary) hypertension: Secondary | ICD-10-CM

## 2023-07-30 MED ORDER — LOSARTAN POTASSIUM 100 MG PO TABS
100.0000 mg | ORAL_TABLET | Freq: Every day | ORAL | 1 refills | Status: DC
  Filled 2023-07-30 – 2023-09-04 (×2): qty 90, 90d supply, fill #0
  Filled 2023-10-03: qty 90, 90d supply, fill #1

## 2023-07-30 MED ORDER — CARVEDILOL 6.25 MG PO TABS
6.2500 mg | ORAL_TABLET | Freq: Two times a day (BID) | ORAL | 1 refills | Status: DC
  Filled 2023-07-30 – 2023-09-04 (×2): qty 180, 90d supply, fill #0
  Filled 2023-10-03: qty 180, 90d supply, fill #1

## 2023-07-30 MED ORDER — SERTRALINE HCL 50 MG PO TABS
50.0000 mg | ORAL_TABLET | Freq: Every day | ORAL | 4 refills | Status: DC
  Filled 2023-07-30 – 2023-09-04 (×2): qty 30, 30d supply, fill #0
  Filled 2023-10-03 – 2023-10-05 (×2): qty 30, 30d supply, fill #1
  Filled 2023-11-14 – 2023-11-16 (×2): qty 30, 30d supply, fill #2
  Filled 2023-12-16: qty 30, 30d supply, fill #3

## 2023-08-11 ENCOUNTER — Other Ambulatory Visit: Payer: Self-pay

## 2023-09-04 ENCOUNTER — Other Ambulatory Visit: Payer: Self-pay

## 2023-10-03 ENCOUNTER — Other Ambulatory Visit (HOSPITAL_COMMUNITY): Payer: Self-pay

## 2023-10-05 ENCOUNTER — Other Ambulatory Visit (HOSPITAL_COMMUNITY): Payer: Self-pay

## 2023-10-05 ENCOUNTER — Other Ambulatory Visit: Payer: Self-pay

## 2023-10-28 ENCOUNTER — Other Ambulatory Visit: Payer: Self-pay | Admitting: Internal Medicine

## 2023-10-28 DIAGNOSIS — Z8739 Personal history of other diseases of the musculoskeletal system and connective tissue: Secondary | ICD-10-CM

## 2023-10-28 DIAGNOSIS — I10 Essential (primary) hypertension: Secondary | ICD-10-CM

## 2023-10-29 ENCOUNTER — Other Ambulatory Visit: Payer: Self-pay

## 2023-10-29 MED ORDER — AMLODIPINE BESYLATE 10 MG PO TABS
10.0000 mg | ORAL_TABLET | Freq: Every day | ORAL | 0 refills | Status: DC
  Filled 2023-10-29: qty 30, 30d supply, fill #0

## 2023-10-29 MED ORDER — HYDROCHLOROTHIAZIDE 12.5 MG PO TABS
12.5000 mg | ORAL_TABLET | Freq: Every day | ORAL | 0 refills | Status: DC
  Filled 2023-10-29: qty 30, 30d supply, fill #0

## 2023-10-29 MED ORDER — COLCHICINE 0.6 MG PO TABS
0.6000 mg | ORAL_TABLET | Freq: Every day | ORAL | 0 refills | Status: DC
  Filled 2023-10-29: qty 30, 30d supply, fill #0

## 2023-10-30 ENCOUNTER — Other Ambulatory Visit: Payer: Self-pay

## 2023-11-14 ENCOUNTER — Other Ambulatory Visit: Payer: Self-pay | Admitting: Internal Medicine

## 2023-11-14 DIAGNOSIS — Z8739 Personal history of other diseases of the musculoskeletal system and connective tissue: Secondary | ICD-10-CM

## 2023-11-16 ENCOUNTER — Other Ambulatory Visit: Payer: Self-pay

## 2023-11-16 MED ORDER — ALLOPURINOL 300 MG PO TABS
300.0000 mg | ORAL_TABLET | Freq: Every day | ORAL | 1 refills | Status: DC
  Filled 2023-11-16: qty 90, 90d supply, fill #0

## 2023-11-30 ENCOUNTER — Other Ambulatory Visit: Payer: Self-pay | Admitting: Internal Medicine

## 2023-11-30 DIAGNOSIS — I10 Essential (primary) hypertension: Secondary | ICD-10-CM

## 2023-11-30 DIAGNOSIS — Z8739 Personal history of other diseases of the musculoskeletal system and connective tissue: Secondary | ICD-10-CM

## 2023-12-01 ENCOUNTER — Other Ambulatory Visit: Payer: Self-pay

## 2023-12-02 ENCOUNTER — Other Ambulatory Visit: Payer: Self-pay

## 2023-12-02 ENCOUNTER — Encounter: Payer: Self-pay | Admitting: Pharmacist

## 2023-12-04 ENCOUNTER — Other Ambulatory Visit: Payer: Self-pay | Admitting: Internal Medicine

## 2023-12-04 DIAGNOSIS — I10 Essential (primary) hypertension: Secondary | ICD-10-CM

## 2023-12-04 DIAGNOSIS — Z8739 Personal history of other diseases of the musculoskeletal system and connective tissue: Secondary | ICD-10-CM

## 2023-12-04 NOTE — Telephone Encounter (Signed)
 Not a pt at [facility]; request sent to the wrong office.

## 2023-12-04 NOTE — Telephone Encounter (Unsigned)
 Copied from CRM [id number]. Topic: Clinical - Medication Refill >> Dec 04, 2023 10:45 AM Myrick T wrote: Medication: amLODipine  (NORVASC ) 10 MG tablet, carvedilol  (COREG ) 6.25 MG tablet, colchicine  0.6 MG tablet and losartan  (COZAAR ) 100 MG tablet  Has the patient contacted their pharmacy? No  This is the patient's preferred pharmacy:  [facility] [address] [place] [address] Phone: [phone] Fax: [phone]  Is this the correct pharmacy for this prescription? Yes  Has the prescription been filled recently? Yes  Is the patient out of the medication? Yes  Has the patient been seen for an appointment in the last year OR does the patient have an upcoming appointment? Yes  Can we respond through MyChart? Yes  Agent: Please be advised that Rx refills may take up to 3 business days. We ask that you follow-up with your pharmacy.

## 2023-12-09 ENCOUNTER — Telehealth: Payer: Self-pay | Admitting: Internal Medicine

## 2023-12-09 DIAGNOSIS — I10 Essential (primary) hypertension: Secondary | ICD-10-CM

## 2023-12-09 DIAGNOSIS — Z8739 Personal history of other diseases of the musculoskeletal system and connective tissue: Secondary | ICD-10-CM

## 2023-12-09 MED ORDER — AMLODIPINE BESYLATE 10 MG PO TABS
10.0000 mg | ORAL_TABLET | Freq: Every day | ORAL | 0 refills | Status: DC
  Filled 2023-12-09 – 2023-12-10 (×2): qty 30, 30d supply, fill #0

## 2023-12-09 MED ORDER — LOSARTAN POTASSIUM 100 MG PO TABS
100.0000 mg | ORAL_TABLET | Freq: Every day | ORAL | 0 refills | Status: DC
  Filled 2023-12-09: qty 30, 30d supply, fill #0

## 2023-12-09 MED ORDER — HYDROCHLOROTHIAZIDE 12.5 MG PO TABS
12.5000 mg | ORAL_TABLET | Freq: Every day | ORAL | 0 refills | Status: DC
  Filled 2023-12-09: qty 30, 30d supply, fill #0

## 2023-12-09 MED ORDER — CARVEDILOL 6.25 MG PO TABS
6.2500 mg | ORAL_TABLET | Freq: Two times a day (BID) | ORAL | 0 refills | Status: DC
Start: 2023-12-09 — End: 2023-12-17
  Filled 2023-12-09: qty 60, 30d supply, fill #0

## 2023-12-09 MED ORDER — ALLOPURINOL 300 MG PO TABS
300.0000 mg | ORAL_TABLET | Freq: Every day | ORAL | 0 refills | Status: DC
Start: 2023-12-09 — End: 2023-12-17
  Filled 2023-12-09: qty 30, 30d supply, fill #0

## 2023-12-09 NOTE — Telephone Encounter (Signed)
 Please advise if it is possible to do a 14 day refill until the patient's next appointment on 12/21/2023.

## 2023-12-09 NOTE — Addendum Note (Signed)
 Addended by: VICCI SOBER B on: 12/09/2023 08:10 PM   Modules accepted: Orders

## 2023-12-09 NOTE — Telephone Encounter (Signed)
 Copied from CRM [id number]. Topic: Clinical - Medical Advice >> Dec 09, 2023  2:21 PM Winona SAUNDERS wrote:  Pt calling with concerns about going until July 14th with out any of his medications. Please give him a call to assist him with is concerns.

## 2023-12-09 NOTE — Telephone Encounter (Signed)
 1 mth RF set on BP meds and allopurinol .

## 2023-12-09 NOTE — Addendum Note (Signed)
 Addended by: VICCI SOBER B on: 12/09/2023 08:08 PM   Modules accepted: Orders

## 2023-12-10 ENCOUNTER — Other Ambulatory Visit: Payer: Self-pay | Admitting: Internal Medicine

## 2023-12-10 ENCOUNTER — Other Ambulatory Visit: Payer: Self-pay

## 2023-12-10 DIAGNOSIS — Z8739 Personal history of other diseases of the musculoskeletal system and connective tissue: Secondary | ICD-10-CM

## 2023-12-10 NOTE — Telephone Encounter (Signed)
 Called & spoke to the patient. Verified name & DOB. Informed that 1 month refills were sent to the pharmacy. Patient requested a sooner appointment. Visit rescheduled for 12/17/2023. Patient confirmed and expressed verbal understanding of all discussed.

## 2023-12-16 ENCOUNTER — Telehealth: Payer: Self-pay | Admitting: Internal Medicine

## 2023-12-16 ENCOUNTER — Other Ambulatory Visit: Payer: Self-pay | Admitting: Internal Medicine

## 2023-12-16 ENCOUNTER — Other Ambulatory Visit: Payer: Self-pay

## 2023-12-16 DIAGNOSIS — Z8739 Personal history of other diseases of the musculoskeletal system and connective tissue: Secondary | ICD-10-CM

## 2023-12-16 NOTE — Telephone Encounter (Signed)
 Called pt to confirm appt. Pt will be present.

## 2023-12-17 ENCOUNTER — Other Ambulatory Visit: Payer: Self-pay | Admitting: Internal Medicine

## 2023-12-17 ENCOUNTER — Other Ambulatory Visit: Payer: Self-pay

## 2023-12-17 ENCOUNTER — Ambulatory Visit: Payer: Self-pay | Attending: Internal Medicine | Admitting: Internal Medicine

## 2023-12-17 ENCOUNTER — Encounter: Payer: Self-pay | Admitting: Internal Medicine

## 2023-12-17 VITALS — BP 122/84 | HR 82 | Temp 98.2°F | Ht 71.0 in | Wt 347.0 lb

## 2023-12-17 DIAGNOSIS — Z8739 Personal history of other diseases of the musculoskeletal system and connective tissue: Secondary | ICD-10-CM

## 2023-12-17 DIAGNOSIS — E538 Deficiency of other specified B group vitamins: Secondary | ICD-10-CM

## 2023-12-17 DIAGNOSIS — M7502 Adhesive capsulitis of left shoulder: Secondary | ICD-10-CM

## 2023-12-17 DIAGNOSIS — F330 Major depressive disorder, recurrent, mild: Secondary | ICD-10-CM

## 2023-12-17 DIAGNOSIS — L309 Dermatitis, unspecified: Secondary | ICD-10-CM

## 2023-12-17 DIAGNOSIS — Z23 Encounter for immunization: Secondary | ICD-10-CM

## 2023-12-17 DIAGNOSIS — M25552 Pain in left hip: Secondary | ICD-10-CM

## 2023-12-17 DIAGNOSIS — I10 Essential (primary) hypertension: Secondary | ICD-10-CM

## 2023-12-17 DIAGNOSIS — G8929 Other chronic pain: Secondary | ICD-10-CM

## 2023-12-17 MED ORDER — LOSARTAN POTASSIUM 100 MG PO TABS
100.0000 mg | ORAL_TABLET | Freq: Every day | ORAL | 1 refills | Status: DC
  Filled 2023-12-17 – 2024-02-08 (×3): qty 90, 90d supply, fill #0
  Filled 2024-02-09: qty 30, 30d supply, fill #0
  Filled 2024-03-10 (×2): qty 30, 30d supply, fill #1
  Filled 2024-04-12: qty 30, 30d supply, fill #2
  Filled 2024-05-11 (×2): qty 30, 30d supply, fill #3

## 2023-12-17 MED ORDER — SERTRALINE HCL 100 MG PO TABS
100.0000 mg | ORAL_TABLET | Freq: Every day | ORAL | 3 refills | Status: DC
  Filled 2023-12-17: qty 30, 30d supply, fill #0
  Filled 2024-01-18 – 2024-02-08 (×3): qty 30, 30d supply, fill #1
  Filled 2024-03-10 (×2): qty 30, 30d supply, fill #2
  Filled 2024-04-12: qty 30, 30d supply, fill #3

## 2023-12-17 MED ORDER — NYSTATIN 100000 UNIT/GM EX CREA
1.0000 | TOPICAL_CREAM | Freq: Two times a day (BID) | CUTANEOUS | 0 refills | Status: DC
  Filled 2023-12-17: qty 30, 15d supply, fill #0

## 2023-12-17 MED ORDER — COLCHICINE 0.6 MG PO TABS
0.3000 mg | ORAL_TABLET | ORAL | 1 refills | Status: DC
  Filled 2023-12-17: qty 36, 168d supply, fill #0

## 2023-12-17 MED ORDER — ALLOPURINOL 300 MG PO TABS
300.0000 mg | ORAL_TABLET | Freq: Every day | ORAL | 1 refills | Status: DC
  Filled 2023-12-17: qty 90, 90d supply, fill #0
  Filled 2024-02-16: qty 30, 30d supply, fill #0
  Filled 2024-03-17 (×2): qty 30, 30d supply, fill #1
  Filled 2024-04-12: qty 30, 30d supply, fill #2
  Filled 2024-05-19 – 2024-05-23 (×3): qty 30, 30d supply, fill #3

## 2023-12-17 MED ORDER — AMLODIPINE BESYLATE 10 MG PO TABS
10.0000 mg | ORAL_TABLET | Freq: Every day | ORAL | 1 refills | Status: DC
  Filled 2023-12-17 – 2024-01-04 (×2): qty 90, 90d supply, fill #0
  Filled 2024-03-17: qty 90, 90d supply, fill #1
  Filled 2024-04-12: qty 30, 30d supply, fill #1
  Filled 2024-05-11 (×2): qty 30, 30d supply, fill #2

## 2023-12-17 MED ORDER — CARVEDILOL 6.25 MG PO TABS
6.2500 mg | ORAL_TABLET | Freq: Two times a day (BID) | ORAL | 1 refills | Status: DC
  Filled 2023-12-17 – 2024-02-08 (×3): qty 180, 90d supply, fill #0
  Filled 2024-02-09: qty 60, 30d supply, fill #0
  Filled 2024-03-10 (×2): qty 60, 30d supply, fill #1
  Filled 2024-04-12: qty 60, 30d supply, fill #2
  Filled 2024-05-11 (×2): qty 60, 30d supply, fill #3

## 2023-12-17 MED ORDER — HYDROCHLOROTHIAZIDE 12.5 MG PO TABS
12.5000 mg | ORAL_TABLET | Freq: Every day | ORAL | 1 refills | Status: DC
  Filled 2023-12-17 – 2024-01-04 (×2): qty 90, 90d supply, fill #0
  Filled 2024-04-12: qty 30, 30d supply, fill #1
  Filled 2024-05-11 (×2): qty 30, 30d supply, fill #2

## 2023-12-17 NOTE — Patient Instructions (Signed)
 VISIT SUMMARY:  You came in today for medication refills and to discuss your left shoulder and hip pain. We reviewed your hypertension management, gout, depression, and a rash on your right flank. We also discussed your B12 deficiency and the need for further evaluation of your shoulder and hip pain.  YOUR PLAN:  -HYPERTENSION: Hypertension means high blood pressure. Your blood pressure is currently well-managed with your medications, but you need to continue taking them regularly. We refilled your prescriptions for amlodipine , hydrochlorothiazide , carvedilol , and losartan . Please monitor your blood pressure regularly and try to limit your salt intake.  -GOUT: Gout is a type of arthritis caused by excess uric acid in the blood. You had a recent flare because you missed your colchicine . We refilled your colchicine  prescription, and you should continue taking allopurinol  as prescribed.  -DEPRESSION: Depression is a mood disorder that causes persistent feelings of sadness and loss of interest. Your symptoms have not significantly improved on your current dose of sertraline , so we have increased your dose to 100 mg daily.  -LEFT SHOULDER AND HIP PAIN: You have recurrent pain in your left shoulder and hip, which was previously treated with injections. We will refer you to orthopedics for further evaluation and management.  -B12 DEFICIENCY: B12 deficiency means you have low levels of vitamin B12, which is important for nerve function and the production of red blood cells. We will order a blood test to check your B12 levels.  -RASH ON RIGHT FLANK: You have a rash on your right flank that is likely fungal and has not responded to over-the-counter treatments. We prescribed an antifungal cream for you to use and asked you to take a picture of the rash for your medical record.  INSTRUCTIONS:  Please follow up with orthopedics for your shoulder and hip pain. Continue taking your medications as prescribed and  monitor your blood pressure regularly. We will contact you with the results of your B12 blood test. If your rash does not improve with the antifungal cream, please let us  know.
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
## 2023-12-18 ENCOUNTER — Ambulatory Visit: Payer: Self-pay | Admitting: Internal Medicine

## 2023-12-18 ENCOUNTER — Other Ambulatory Visit: Payer: Self-pay

## 2023-12-18 LAB — CBC
Hematocrit: 42.7 % (ref 37.5–51.0)
Hemoglobin: 13.8 g/dL (ref 13.0–17.7)
MCH: 29.9 pg (ref 26.6–33.0)
MCHC: 32.3 g/dL (ref 31.5–35.7)
MCV: 92 fL (ref 79–97)
Platelets: 176 x10E3/uL (ref 150–450)
RBC: 4.62 x10E6/uL (ref 4.14–5.80)
RDW: 11.8 % (ref 11.6–15.4)
WBC: 4 x10E3/uL (ref 3.4–10.8)

## 2023-12-18 LAB — COMPREHENSIVE METABOLIC PANEL WITH GFR
ALT: 25 IU/L (ref 0–44)
AST: 26 IU/L (ref 0–40)
Albumin: 4.4 g/dL (ref 4.1–5.1)
Alkaline Phosphatase: 59 IU/L (ref 44–121)
BUN/Creatinine Ratio: 14 (ref 9–20)
BUN: 13 mg/dL (ref 6–24)
Bilirubin Total: 0.6 mg/dL (ref 0.0–1.2)
CO2: 22 mmol/L (ref 20–29)
Calcium: 9.5 mg/dL (ref 8.7–10.2)
Chloride: 102 mmol/L (ref 96–106)
Creatinine, Ser: 0.9 mg/dL (ref 0.76–1.27)
Globulin, Total: 3.1 g/dL (ref 1.5–4.5)
Glucose: 114 mg/dL — ABNORMAL HIGH (ref 70–99)
Potassium: 4.2 mmol/L (ref 3.5–5.2)
Sodium: 141 mmol/L (ref 134–144)
Total Protein: 7.5 g/dL (ref 6.0–8.5)
eGFR: 105 mL/min/1.73 (ref >59)

## 2023-12-18 LAB — VITAMIN B12: Vitamin B-12: 350 pg/mL (ref 232–1245)

## 2023-12-18 LAB — LIPID PANEL
Chol/HDL Ratio: 3.2 ratio (ref 0.0–5.0)
Cholesterol, Total: 180 mg/dL (ref 100–199)
HDL: 57 mg/dL (ref >39)
LDL Chol Calc (NIH): 105 mg/dL — ABNORMAL HIGH (ref 0–99)
Triglycerides: 99 mg/dL (ref 0–149)
VLDL Cholesterol Cal: 18 mg/dL (ref 5–40)

## 2023-12-18 MED ORDER — CYANOCOBALAMIN 500 MCG PO TABS
500.0000 ug | ORAL_TABLET | Freq: Every day | ORAL | 1 refills | Status: DC
  Filled 2023-12-18: qty 100, 100d supply, fill #0
  Filled 2024-03-29: qty 100, 100d supply, fill #1

## 2023-12-21 ENCOUNTER — Ambulatory Visit: Payer: Self-pay | Admitting: Internal Medicine

## 2024-01-04 ENCOUNTER — Other Ambulatory Visit: Payer: Self-pay

## 2024-01-04 ENCOUNTER — Other Ambulatory Visit: Payer: Self-pay | Admitting: Internal Medicine

## 2024-01-04 DIAGNOSIS — L309 Dermatitis, unspecified: Secondary | ICD-10-CM

## 2024-01-04 MED ORDER — NYSTATIN 100000 UNIT/GM EX CREA
1.0000 | TOPICAL_CREAM | Freq: Two times a day (BID) | CUTANEOUS | 2 refills | Status: AC
  Filled 2024-01-04: qty 30, 15d supply, fill #0
  Filled 2024-01-18: qty 30, 30d supply, fill #0
  Filled 2024-02-08 – 2024-07-01 (×3): qty 30, 15d supply, fill #0

## 2024-01-05 ENCOUNTER — Other Ambulatory Visit: Payer: Self-pay

## 2024-01-18 ENCOUNTER — Encounter: Payer: Self-pay | Admitting: Orthopedic Surgery

## 2024-01-18 ENCOUNTER — Other Ambulatory Visit: Payer: Self-pay

## 2024-01-18 ENCOUNTER — Ambulatory Visit (INDEPENDENT_AMBULATORY_CARE_PROVIDER_SITE_OTHER): Payer: Self-pay | Admitting: Orthopedic Surgery

## 2024-01-18 DIAGNOSIS — M1612 Unilateral primary osteoarthritis, left hip: Secondary | ICD-10-CM

## 2024-01-18 DIAGNOSIS — M7522 Bicipital tendinitis, left shoulder: Secondary | ICD-10-CM

## 2024-01-18 MED ORDER — LIDOCAINE HCL 1 % IJ SOLN
5.0000 mL | INTRAMUSCULAR | Status: AC | PRN
  Administered 2024-01-18 (×2): 5 mL

## 2024-01-18 MED ORDER — BUPIVACAINE HCL 0.25 % IJ SOLN
4.0000 mL | INTRAMUSCULAR | Status: AC | PRN
  Administered 2024-01-18 (×2): 4 mL via INTRA_ARTICULAR

## 2024-01-18 MED ORDER — BUPIVACAINE HCL 0.5 % IJ SOLN
9.0000 mL | INTRAMUSCULAR | Status: AC | PRN
  Administered 2024-01-18 (×2): 9 mL via INTRA_ARTICULAR

## 2024-01-18 MED ORDER — METHYLPREDNISOLONE ACETATE 80 MG/ML IJ SUSP
80.0000 mg | INTRAMUSCULAR | Status: AC | PRN
  Administered 2024-01-18 (×2): 80 mg via INTRA_ARTICULAR

## 2024-01-18 MED ORDER — TRIAMCINOLONE ACETONIDE 40 MG/ML IJ SUSP
40.0000 mg | INTRAMUSCULAR | Status: AC | PRN
  Administered 2024-01-18 (×2): 40 mg via INTRA_ARTICULAR
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
## 2024-01-22 ENCOUNTER — Other Ambulatory Visit: Payer: Self-pay

## 2024-01-25 ENCOUNTER — Ambulatory Visit: Payer: Self-pay

## 2024-01-26 ENCOUNTER — Ambulatory Visit: Payer: Self-pay

## 2024-01-27 ENCOUNTER — Other Ambulatory Visit: Payer: Self-pay

## 2024-02-08 ENCOUNTER — Other Ambulatory Visit: Payer: Self-pay

## 2024-02-09 ENCOUNTER — Other Ambulatory Visit: Payer: Self-pay

## 2024-02-16 ENCOUNTER — Other Ambulatory Visit: Payer: Self-pay

## 2024-03-10 ENCOUNTER — Other Ambulatory Visit: Payer: Self-pay

## 2024-03-11 ENCOUNTER — Other Ambulatory Visit: Payer: Self-pay

## 2024-03-17 ENCOUNTER — Other Ambulatory Visit: Payer: Self-pay

## 2024-03-18 ENCOUNTER — Other Ambulatory Visit: Payer: Self-pay

## 2024-03-29 ENCOUNTER — Other Ambulatory Visit: Payer: Self-pay

## 2024-04-07 ENCOUNTER — Other Ambulatory Visit: Payer: Self-pay

## 2024-04-11 ENCOUNTER — Encounter: Payer: Self-pay | Admitting: Radiology

## 2024-04-12 ENCOUNTER — Other Ambulatory Visit: Payer: Self-pay

## 2024-04-18 ENCOUNTER — Ambulatory Visit: Payer: Self-pay | Admitting: Internal Medicine

## 2024-05-11 ENCOUNTER — Other Ambulatory Visit: Payer: Self-pay | Admitting: Internal Medicine

## 2024-05-11 ENCOUNTER — Other Ambulatory Visit: Payer: Self-pay

## 2024-05-11 DIAGNOSIS — F330 Major depressive disorder, recurrent, mild: Secondary | ICD-10-CM

## 2024-05-11 MED ORDER — SERTRALINE HCL 100 MG PO TABS
100.0000 mg | ORAL_TABLET | Freq: Every day | ORAL | 5 refills | Status: AC
  Filled 2024-05-11 (×2): qty 30, 30d supply, fill #0
  Filled 2024-07-01: qty 30, 30d supply, fill #1

## 2024-05-20 ENCOUNTER — Other Ambulatory Visit: Payer: Self-pay

## 2024-05-23 ENCOUNTER — Other Ambulatory Visit: Payer: Self-pay

## 2024-05-24 ENCOUNTER — Ambulatory Visit: Payer: Self-pay | Admitting: Internal Medicine

## 2024-05-26 ENCOUNTER — Other Ambulatory Visit: Payer: Self-pay

## 2024-06-14 ENCOUNTER — Ambulatory Visit: Payer: Self-pay | Attending: Internal Medicine | Admitting: Internal Medicine

## 2024-06-14 ENCOUNTER — Other Ambulatory Visit: Payer: Self-pay

## 2024-06-14 VITALS — BP 121/82 | HR 82 | Temp 98.3°F | Ht 71.0 in | Wt 316.0 lb

## 2024-06-14 DIAGNOSIS — Z6841 Body Mass Index (BMI) 40.0 and over, adult: Secondary | ICD-10-CM

## 2024-06-14 DIAGNOSIS — M542 Cervicalgia: Secondary | ICD-10-CM

## 2024-06-14 DIAGNOSIS — L309 Dermatitis, unspecified: Secondary | ICD-10-CM

## 2024-06-14 DIAGNOSIS — E538 Deficiency of other specified B group vitamins: Secondary | ICD-10-CM

## 2024-06-14 DIAGNOSIS — Z8739 Personal history of other diseases of the musculoskeletal system and connective tissue: Secondary | ICD-10-CM

## 2024-06-14 DIAGNOSIS — I10 Essential (primary) hypertension: Secondary | ICD-10-CM

## 2024-06-14 DIAGNOSIS — Z23 Encounter for immunization: Secondary | ICD-10-CM

## 2024-06-14 MED ORDER — CYANOCOBALAMIN 500 MCG PO TABS
500.0000 ug | ORAL_TABLET | Freq: Every day | ORAL | 1 refills | Status: AC
  Filled 2024-06-14: qty 100, 100d supply, fill #0

## 2024-06-14 MED ORDER — LOSARTAN POTASSIUM 100 MG PO TABS
100.0000 mg | ORAL_TABLET | Freq: Every day | ORAL | 2 refills | Status: AC
  Filled 2024-06-14: qty 30, 30d supply, fill #0
  Filled 2024-07-15: qty 30, 30d supply, fill #1

## 2024-06-14 MED ORDER — HYDROCHLOROTHIAZIDE 12.5 MG PO TABS
12.5000 mg | ORAL_TABLET | Freq: Every day | ORAL | 2 refills | Status: AC
  Filled 2024-06-14: qty 30, 30d supply, fill #0
  Filled 2024-07-15: qty 30, 30d supply, fill #1

## 2024-06-14 MED ORDER — CARVEDILOL 6.25 MG PO TABS
6.2500 mg | ORAL_TABLET | Freq: Two times a day (BID) | ORAL | 2 refills | Status: AC
  Filled 2024-06-14: qty 60, 30d supply, fill #0
  Filled 2024-07-15: qty 60, 30d supply, fill #1

## 2024-06-14 MED ORDER — COLCHICINE 0.6 MG PO TABS
0.3000 mg | ORAL_TABLET | ORAL | 1 refills | Status: AC
  Filled 2024-06-14: qty 6, 28d supply, fill #0
  Filled 2024-07-15: qty 6, 28d supply, fill #1

## 2024-06-14 MED ORDER — AMLODIPINE BESYLATE 10 MG PO TABS
10.0000 mg | ORAL_TABLET | Freq: Every day | ORAL | 2 refills | Status: AC
  Filled 2024-06-14: qty 30, 30d supply, fill #0
  Filled 2024-07-15: qty 30, 30d supply, fill #1

## 2024-06-14 MED ORDER — MICONAZOLE NITRATE 2 % EX CREA
1.0000 | TOPICAL_CREAM | Freq: Two times a day (BID) | CUTANEOUS | 0 refills | Status: AC
  Filled 2024-06-14: qty 30, 15d supply, fill #0

## 2024-06-14 MED ORDER — ALLOPURINOL 300 MG PO TABS
300.0000 mg | ORAL_TABLET | Freq: Every day | ORAL | 2 refills | Status: AC
  Filled 2024-06-14: qty 90, 90d supply, fill #0
  Filled 2024-07-01: qty 30, 30d supply, fill #0

## 2024-06-14 NOTE — Patient Instructions (Signed)
" °  VISIT SUMMARY: You came in today for a follow-up on your chronic conditions, including hypertension, obesity, gout, and vitamin B12 deficiency. We also discussed a rash on your left foot that has been bothering you for the past six weeks.  YOUR PLAN: -ESSENTIAL HYPERTENSION: Hypertension is high blood pressure. Your blood pressure is currently controlled at 121/82 mmHg, although the diastolic number is slightly above the target. We have sent refills for amlodipine  and hydrochlorothiazide , and you should continue taking carvedilol  6.25 mg twice daily and losartan  100 mg daily. Please try to limit your salt intake.  -MORBID OBESITY: Obesity is a condition where you have an excessive amount of body fat. You have lost 31 pounds since your last visit, which is great progress. Continue using the treadmill at least three times a week for 30 minutes and maintain your intermittent fasting routine.  -GOUT: Gout is a type of arthritis that causes painful inflammation in the joints. You have not had a full flare-up recently despite a lapse in taking colchicine . Continue taking allopurinol  as prescribed.  -VITAMIN B12 DEFICIENCY: Vitamin B12 deficiency means you have lower than normal levels of vitamin B12, which is important for nerve function and the production of red blood cells. Continue your daily B12 supplementation.  -DERMATITIS OF LEFT FOOT: Dermatitis is inflammation of the skin. You have a persistent rash on your left foot that has not resolved with triamcinolone  and hydrocortisone  creams. We have prescribed miconazole  2% cream to be applied twice daily. Mix it with hydrocortisone  cream and apply it to the affected area twice daily.  -GENERAL HEALTH MAINTENANCE: You have received your flu and pneumonia vaccines. Please apply for Medicaid at the office on the fourth floor.  INSTRUCTIONS: Please follow up with us  if you have any issues with your medications or if your symptoms worsen. Continue with  your current lifestyle changes and medication regimen. Apply for Medicaid at the office on the fourth floor.                      Contains text generated by [employer]bridge.                                 Contains text generated by [employer]bridge.   "
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
## 2024-06-15 ENCOUNTER — Encounter: Payer: Self-pay | Admitting: Internal Medicine

## 2024-06-15 ENCOUNTER — Other Ambulatory Visit: Payer: Self-pay

## 2024-07-01 ENCOUNTER — Other Ambulatory Visit: Payer: Self-pay

## 2024-07-13 ENCOUNTER — Other Ambulatory Visit: Payer: Self-pay

## 2024-07-15 ENCOUNTER — Other Ambulatory Visit: Payer: Self-pay

## 2024-10-17 ENCOUNTER — Ambulatory Visit: Payer: Self-pay | Admitting: Internal Medicine
